# Patient Record
Sex: Female | Born: 1935 | Race: White | Hispanic: No | Marital: Married | Smoking: Never smoker
Health system: Southern US, Community
[De-identification: ages and names within clinical notes are randomized; demographics above are authoritative.]

## PROBLEM LIST (undated history)

## (undated) DIAGNOSIS — R112 Nausea with vomiting, unspecified: Secondary | ICD-10-CM

## (undated) DIAGNOSIS — N289 Disorder of kidney and ureter, unspecified: Secondary | ICD-10-CM

## (undated) DIAGNOSIS — I10 Essential (primary) hypertension: Secondary | ICD-10-CM

## (undated) DIAGNOSIS — N200 Calculus of kidney: Secondary | ICD-10-CM

## (undated) DIAGNOSIS — Z87442 Personal history of urinary calculi: Secondary | ICD-10-CM

## (undated) DIAGNOSIS — M1990 Unspecified osteoarthritis, unspecified site: Secondary | ICD-10-CM

## (undated) DIAGNOSIS — H269 Unspecified cataract: Secondary | ICD-10-CM

## (undated) DIAGNOSIS — T8859XA Other complications of anesthesia, initial encounter: Secondary | ICD-10-CM

## (undated) DIAGNOSIS — Z9889 Other specified postprocedural states: Secondary | ICD-10-CM

## (undated) HISTORY — PX: CHOLECYSTECTOMY: SHX55

## (undated) HISTORY — PX: SPINE SURGERY: SHX786

## (undated) HISTORY — DX: Unspecified cataract: H26.9

## (undated) HISTORY — DX: Calculus of kidney: N20.0

## (undated) HISTORY — PX: JOINT REPLACEMENT: SHX530

## (undated) HISTORY — PX: APPENDECTOMY: SHX54

---

## 1967-02-19 HISTORY — PX: BACK SURGERY: SHX140

## 2005-01-11 ENCOUNTER — Ambulatory Visit: Payer: Self-pay | Admitting: Family Medicine

## 2006-01-22 ENCOUNTER — Ambulatory Visit: Payer: Self-pay | Admitting: Family Medicine

## 2007-02-09 ENCOUNTER — Ambulatory Visit: Payer: Self-pay | Admitting: Family Medicine

## 2008-02-15 ENCOUNTER — Ambulatory Visit: Payer: Self-pay | Admitting: Family Medicine

## 2009-02-15 ENCOUNTER — Ambulatory Visit: Payer: Self-pay | Admitting: Family Medicine

## 2009-09-01 ENCOUNTER — Ambulatory Visit: Payer: Self-pay | Admitting: Family Medicine

## 2010-03-09 ENCOUNTER — Ambulatory Visit: Payer: Self-pay | Admitting: Family Medicine

## 2011-05-15 ENCOUNTER — Ambulatory Visit: Payer: Self-pay | Admitting: Family Medicine

## 2012-09-02 ENCOUNTER — Ambulatory Visit: Payer: Self-pay | Admitting: Family Medicine

## 2012-10-08 DIAGNOSIS — L570 Actinic keratosis: Secondary | ICD-10-CM

## 2012-10-08 HISTORY — DX: Actinic keratosis: L57.0

## 2014-01-19 ENCOUNTER — Ambulatory Visit: Payer: Self-pay | Admitting: Family Medicine

## 2014-04-15 ENCOUNTER — Ambulatory Visit: Payer: Self-pay | Admitting: Family Medicine

## 2014-09-07 ENCOUNTER — Other Ambulatory Visit: Payer: Self-pay | Admitting: Family Medicine

## 2014-09-07 DIAGNOSIS — I10 Essential (primary) hypertension: Secondary | ICD-10-CM | POA: Insufficient documentation

## 2014-09-07 NOTE — Telephone Encounter (Signed)
Last OV 03/2014  Thanks,   -Mickel Baas

## 2014-09-20 ENCOUNTER — Ambulatory Visit (INDEPENDENT_AMBULATORY_CARE_PROVIDER_SITE_OTHER): Payer: Medicare Other | Admitting: Family Medicine

## 2014-09-20 ENCOUNTER — Ambulatory Visit
Admission: RE | Admit: 2014-09-20 | Discharge: 2014-09-20 | Disposition: A | Discharge status: Home / Self Care | Payer: Medicare Other | Source: Ambulatory Visit | Attending: Family Medicine | Admitting: Family Medicine

## 2014-09-20 ENCOUNTER — Encounter: Payer: Self-pay | Admitting: Family Medicine

## 2014-09-20 ENCOUNTER — Telehealth: Payer: Self-pay

## 2014-09-20 VITALS — BP 162/72 | HR 108 | Temp 99.7°F | Resp 16 | Ht 63.0 in | Wt 136.0 lb

## 2014-09-20 DIAGNOSIS — J984 Other disorders of lung: Secondary | ICD-10-CM | POA: Diagnosis not present

## 2014-09-20 DIAGNOSIS — E538 Deficiency of other specified B group vitamins: Secondary | ICD-10-CM | POA: Insufficient documentation

## 2014-09-20 DIAGNOSIS — R6889 Other general symptoms and signs: Secondary | ICD-10-CM | POA: Diagnosis present

## 2014-09-20 DIAGNOSIS — R739 Hyperglycemia, unspecified: Secondary | ICD-10-CM | POA: Insufficient documentation

## 2014-09-20 DIAGNOSIS — J309 Allergic rhinitis, unspecified: Secondary | ICD-10-CM | POA: Insufficient documentation

## 2014-09-20 DIAGNOSIS — E559 Vitamin D deficiency, unspecified: Secondary | ICD-10-CM | POA: Insufficient documentation

## 2014-09-20 DIAGNOSIS — Z789 Other specified health status: Secondary | ICD-10-CM | POA: Insufficient documentation

## 2014-09-20 DIAGNOSIS — H8110 Benign paroxysmal vertigo, unspecified ear: Secondary | ICD-10-CM | POA: Insufficient documentation

## 2014-09-20 DIAGNOSIS — M5430 Sciatica, unspecified side: Secondary | ICD-10-CM | POA: Insufficient documentation

## 2014-09-20 DIAGNOSIS — I10 Essential (primary) hypertension: Secondary | ICD-10-CM | POA: Insufficient documentation

## 2014-09-20 LAB — POCT INFLUENZA A/B
INFLUENZA A, POC: NEGATIVE
Influenza B, POC: NEGATIVE

## 2014-09-20 MED ORDER — AMOXICILLIN-POT CLAVULANATE 875-125 MG PO TABS: 1.0000 | ORAL_TABLET | Freq: Two times a day (BID) | ORAL | Status: DC

## 2014-09-20 NOTE — Progress Notes (Signed)
Subjective:    Patient ID: Gina Middleton, female    DOB: 02-16-1936, 79 y.o.   MRN: [id number]  Sinus Problem This is a new problem. The current episode started in the past 7 days. The problem has been gradually worsening since onset. Maximum temperature: undocumented, pt felt febrile last night. Pt's temp is 99.7 today. Her pain is at a severity of 8/10. The pain is moderate (body aches). Associated symptoms include chills, congestion, coughing, diaphoresis, ear pain (right ear fullness), headaches, a hoarse voice and sinus pressure. Pertinent negatives include no neck pain, shortness of breath, sneezing, sore throat or swollen glands. Past treatments include acetaminophen and oral decongestants (NSAIDs, Claritin). The treatment provided no relief.      Review of Systems  Constitutional: Positive for chills and diaphoresis.  HENT: Positive for congestion, ear pain (right ear fullness), hoarse voice and sinus pressure. Negative for sneezing and sore throat.   Respiratory: Positive for cough. Negative for shortness of breath.   Musculoskeletal: Negative for neck pain.  Neurological: Positive for headaches.   BP 162/72 mmHg  Pulse 108  Temp(Src) 99.7 F (37.6 C) (Oral)  Resp 16  Ht 5\' 3"  (1.6 m)  Wt 136 lb (61.689 kg)  BMI 24.10 kg/m2  SpO2 96%   Patient Active Problem List   Diagnosis Date Noted  . Allergic rhinitis 09/20/2014  . Benign paroxysmal positional nystagmus 09/20/2014  . Blood glucose elevated 09/20/2014  . BP (high blood pressure) 09/20/2014  . Neuralgia neuritis, sciatic nerve 09/20/2014  . Vegetarian 09/20/2014  . B12 deficiency 09/20/2014  . Avitaminosis D 09/20/2014  . Hypertension 09/07/2014   No past medical history on file. Current Outpatient Prescriptions on File Prior to Visit  Medication Sig  . lisinopril-hydrochlorothiazide (PRINZIDE,ZESTORETIC) 10-12.5 MG per tablet take 1 tablet by mouth once daily   No current facility-administered  medications on file prior to visit.   Allergies  Allergen Reactions  . Iodinated Diagnostic Agents   . Oxycodone-Acetaminophen    Past Surgical History  Procedure Laterality Date  . Cholecystectomy    . Appendectomy     History   Social History  . Marital Status: Married    Spouse Name: N/A  . Number of Children: N/A  . Years of Education: College   Occupational History  . Retired Programmer, multimedia   . Works part-time at [employer]  . Smoking status: Never Smoker   . Smokeless tobacco: Never Used  . Alcohol Use: No  . Drug Use: No  . Sexual Activity: Not on file   Other Topics Concern  . Not on file   Social History Narrative   Family History  Problem Relation Age of Onset  . Hypertension Mother   . Alzheimer's disease Mother   . Heart attack Father     x's 3  . Diabetes Sister   . Heart attack Sister   . Breast cancer Maternal Grandmother        Objective:   Physical Exam  Constitutional: She is oriented to person, place, and time. She appears well-developed and well-nourished.  HENT:  Head: Normocephalic and atraumatic.  Right Ear: External ear normal.  Left Ear: External ear normal.  Mouth/Throat: Oropharynx is clear and moist.  Eyes: Conjunctivae and EOM are normal. Pupils are equal, round, and reactive to light.  Neck: Normal range of motion. Neck supple.  Cardiovascular: Normal rate and regular rhythm.   Pulmonary/Chest: Effort normal and breath sounds normal.  Neurological: She is alert and oriented to person, place, and time.  Psychiatric: She has a normal mood and affect. Her behavior is normal. Judgment and thought content normal.  Blood pressure 162/72, pulse 108, temperature 99.7 F (37.6 C), temperature source Oral, resp. rate 16, height 5\' 3"  (1.6 m), weight 136 lb (61.689 kg), SpO2 96 %.     Assessment & Plan:  1. Flu-like symptoms Will treat for pneumonia.   Check CXR.  Flu swab negative. Patient will call back  if condition worsens or does not continue to improve.    - POCT Influenza A/B - DG Chest 2 View; Future  Margarita Rana, MD

## 2014-09-20 NOTE — Telephone Encounter (Signed)
LMTCB 09/20/2014  Thanks,   -Mickel Baas

## 2014-09-20 NOTE — Telephone Encounter (Signed)
-----   Message from Margarita Rana, MD sent at 09/20/2014  3:57 PM EDT ----- CXR shows patchy infiltrated, unclear if pneumonia,  Recommend continue antibiotic and recheck CXR in 2 weeks. Thanks.

## 2014-09-21 NOTE — Telephone Encounter (Signed)
Patient advised as directed below. Will call in two weeks to for the CXR.  Thanks,  -Joseline

## 2014-09-21 NOTE — Telephone Encounter (Signed)
Pt is returning call.  CB#[phone]/MW

## 2014-10-12 ENCOUNTER — Ambulatory Visit
Admission: RE | Admit: 2014-10-12 | Discharge: 2014-10-12 | Disposition: A | Discharge status: Home / Self Care | Payer: Medicare Other | Source: Ambulatory Visit | Attending: Family Medicine | Admitting: Family Medicine

## 2014-10-12 ENCOUNTER — Other Ambulatory Visit: Payer: Self-pay | Admitting: Family Medicine

## 2014-10-12 DIAGNOSIS — R6889 Other general symptoms and signs: Secondary | ICD-10-CM

## 2014-10-12 DIAGNOSIS — R938 Abnormal findings on diagnostic imaging of other specified body structures: Secondary | ICD-10-CM | POA: Insufficient documentation

## 2014-10-12 DIAGNOSIS — R9389 Abnormal findings on diagnostic imaging of other specified body structures: Secondary | ICD-10-CM

## 2014-10-13 ENCOUNTER — Telehealth: Payer: Self-pay

## 2014-10-13 NOTE — Telephone Encounter (Signed)
LMTCB. sd  

## 2014-10-13 NOTE — Telephone Encounter (Signed)
Informed pt of results. Renaldo Fiddler, CMA

## 2014-10-13 NOTE — Telephone Encounter (Signed)
LMTCB 10/13/2014   Thanks,   -Ijanae Macapagal  

## 2014-10-13 NOTE — Telephone Encounter (Signed)
-----   Message from Margarita Rana, MD sent at 10/12/2014  1:21 PM EDT ----- CXR now normal. No need for further follow up. Thanks.

## 2014-11-04 ENCOUNTER — Ambulatory Visit (INDEPENDENT_AMBULATORY_CARE_PROVIDER_SITE_OTHER): Payer: Medicare Other | Admitting: Physician Assistant

## 2014-11-04 ENCOUNTER — Encounter: Payer: Self-pay | Admitting: Physician Assistant

## 2014-11-04 ENCOUNTER — Other Ambulatory Visit: Payer: Self-pay

## 2014-11-04 VITALS — BP 130/70 | HR 62 | Temp 97.8°F | Resp 16 | Wt 135.2 lb

## 2014-11-04 DIAGNOSIS — R300 Dysuria: Secondary | ICD-10-CM

## 2014-11-04 LAB — POCT URINALYSIS DIPSTICK
BILIRUBIN UA: NEGATIVE
GLUCOSE UA: NEGATIVE
KETONES UA: NEGATIVE
Leukocytes, UA: NEGATIVE
NITRITE UA: NEGATIVE
PH UA: 5
Protein, UA: NEGATIVE
RBC UA: NEGATIVE
Spec Grav, UA: 1.025
Urobilinogen, UA: 0.2

## 2014-11-04 MED ORDER — NITROFURANTOIN MONOHYD MACRO 100 MG PO CAPS: 100.0000 mg | ORAL_CAPSULE | Freq: Two times a day (BID) | ORAL | Status: DC

## 2014-11-04 NOTE — Progress Notes (Signed)
Patient: Gina Middleton Female    DOB: [birth year]   79 y.o.   MRN: [id number] Visit Date: 11/04/2014  Today's Provider: [name], PA-C   Chief Complaint  Patient presents with  . Urinary Tract Infection   Subjective:    Urinary Tract Infection  This is a new problem. The current episode started in the past 7 days. The problem occurs intermittently. The problem has been unchanged. The quality of the pain is described as burning (dull ache ). The pain is at a severity of 2/10. The pain is mild. There has been no fever. Associated symptoms include urgency. Pertinent negatives include no chills, discharge, flank pain, hematuria or nausea. She has tried increased fluids and sitz baths for the symptoms. The treatment provided mild (temporarily) relief.  She has also been using cranberry juice and Azo tabs. The symptoms will occur for a day or 2 and then they will subside for a day or 2 and then come right back. She states that today she isn't actually not having any symptoms. Yesterday however she was having a lot of burning discomfort with urination.     Allergies  Allergen Reactions  . Iodinated Diagnostic Agents   . Oxycodone-Acetaminophen    Previous Medications   ASPIRIN 81 MG TABLET    Take by mouth.   CYANOCOBALAMIN 1000 MCG TBCR    Take by mouth.   LISINOPRIL-HYDROCHLOROTHIAZIDE (PRINZIDE,ZESTORETIC) 10-12.5 MG PER TABLET    take 1 tablet by mouth once daily   MELOXICAM (MOBIC) 15 MG TABLET    Take by mouth.   OMEGA-3 FATTY ACIDS (FISH OIL) 1000 MG CPDR    Take by mouth.   ONDANSETRON (ZOFRAN-ODT) 4 MG DISINTEGRATING TABLET       VITAMIN D, ERGOCALCIFEROL, (DRISDOL) 50000 UNITS CAPS CAPSULE    Take by mouth.    Review of Systems  Constitutional: Negative.  Negative for chills.  HENT: Negative.   Eyes: Negative.   Respiratory: Positive for cough (from the pneumonia Dx 09/20/2014).   Cardiovascular: Negative.   Gastrointestinal: Negative.  Negative for  nausea.  Endocrine: Negative.   Genitourinary: Positive for urgency. Negative for hematuria, flank pain, vaginal pain and pelvic pain.  Musculoskeletal: Negative.   Skin: Negative.   Allergic/Immunologic: Negative.   Neurological: Negative.   Hematological: Negative.   Psychiatric/Behavioral: Negative.     Social History  Substance Use Topics  . Smoking status: Never Smoker   . Smokeless tobacco: Never Used  . Alcohol Use: No   Objective:   BP 130/70 mmHg  Pulse 62  Temp(Src) 97.8 F (36.6 C) (Oral)  Resp 16  Wt 135 lb 3.2 oz (61.326 kg)  Physical Exam  Constitutional: She is oriented to person, place, and time. She appears well-developed and well-nourished. No distress.  Cardiovascular: Normal rate, regular rhythm and normal heart sounds.  Exam reveals no gallop and no friction rub.   No murmur heard. Pulmonary/Chest: Effort normal and breath sounds normal. No respiratory distress. She has no wheezes. She has no rales.  Abdominal: Soft. Normal appearance and bowel sounds are normal. She exhibits no distension and no mass. There is no hepatosplenomegaly. There is tenderness in the suprapubic area. There is no rebound, no guarding and no CVA tenderness.  Suprapubic tenderness  Neurological: She is alert and oriented to person, place, and time.  Skin: Skin is warm and dry. She is not diaphoretic.        Assessment & Plan:  1. Difficult or painful urination UA was negative for white blood cells or nitrites. There was no hematuria noted on dipstick. Urine was slightly concentrated with a specific gravity of 1.025. I did encourage her to increase fluids as some of the burning sensation may be secondary to dehydration. I will still send her urine for a culture to rule out any possible bacterial infections. I also went ahead and gave her prescription for Macrobid as below to start once the culture results come back in. She is to call the office if symptoms persist or worsen. -  POCT urinalysis dipstick - Urine Culture - nitrofurantoin, macrocrystal-monohydrate, (MACROBID) 100 MG capsule; Take 1 capsule (100 mg total) by mouth 2 (two) times daily.  Dispense: 10 capsule; Refill: 0       [name], PA-C  [facility]

## 2014-11-04 NOTE — Patient Instructions (Signed)

## 2014-11-07 ENCOUNTER — Telehealth: Payer: Self-pay

## 2014-11-07 LAB — URINE CULTURE

## 2014-11-07 LAB — PLEASE NOTE

## 2014-11-07 MED ORDER — SULFAMETHOXAZOLE-TRIMETHOPRIM 800-160 MG PO TABS: 1.0000 | ORAL_TABLET | Freq: Two times a day (BID) | ORAL | Status: DC

## 2014-11-07 NOTE — Addendum Note (Signed)
Addended by: [name] on: 11/07/2014 09:43 AM   Modules accepted: Orders, Medications

## 2014-11-07 NOTE — Telephone Encounter (Signed)
LM with Kyung Rudd for patient to return the call.  Thanks,  -Joseline

## 2014-11-07 NOTE — Telephone Encounter (Signed)
-----   Message from [name], [place] sent at 11/07/2014  9:42 AM EDT ----- Culture shows antibiotic is insufficient in covering the UTI.  Will switch to trimethoprim-sulfa.  This new Rx will be sent to your pharmacy.  Please discontinue macrobid and start new ABx.  Thanks.

## 2014-11-07 NOTE — Telephone Encounter (Signed)
Patient advised as directed below.  Thanks,  -Joseline 

## 2015-01-25 ENCOUNTER — Ambulatory Visit (INDEPENDENT_AMBULATORY_CARE_PROVIDER_SITE_OTHER): Payer: Medicare Other | Admitting: Family Medicine

## 2015-01-25 ENCOUNTER — Encounter: Payer: Self-pay | Admitting: Family Medicine

## 2015-01-25 VITALS — BP 126/80 | HR 68 | Temp 98.7°F | Resp 16 | Wt 136.0 lb

## 2015-01-25 DIAGNOSIS — J309 Allergic rhinitis, unspecified: Secondary | ICD-10-CM | POA: Diagnosis not present

## 2015-01-25 DIAGNOSIS — E538 Deficiency of other specified B group vitamins: Secondary | ICD-10-CM

## 2015-01-25 DIAGNOSIS — R739 Hyperglycemia, unspecified: Secondary | ICD-10-CM | POA: Diagnosis not present

## 2015-01-25 DIAGNOSIS — I10 Essential (primary) hypertension: Secondary | ICD-10-CM

## 2015-01-25 DIAGNOSIS — E559 Vitamin D deficiency, unspecified: Secondary | ICD-10-CM | POA: Diagnosis not present

## 2015-01-25 DIAGNOSIS — Z0000 Encounter for general adult medical examination without abnormal findings: Secondary | ICD-10-CM

## 2015-01-25 NOTE — Progress Notes (Signed)
Patient ID: Gina Middleton, female   DOB: 10-13-1935, 79 y.o.   MRN: [id number]         Patient: Gina Middleton, Female    DOB: 12/08/1935, 79 y.o.   MRN: [id number] Visit Date: 01/26/2015  Today's Provider: Margarita Rana, MD   Chief Complaint  Patient presents with  . Annual Exam   Subjective:    Annual wellness visit Gina Middleton is a 79 y.o. female. She feels fairly well. She reports exercising regularly. She reports she is sleeping well. Chronic problems are stable. Has not acute concerns today.   -----------------------------------------------------------   Review of Systems  Constitutional: Negative.   HENT: Negative.   Eyes: Negative.   Respiratory: Negative.   Cardiovascular: Negative.   Endocrine: Negative.   Genitourinary: Positive for urgency. Negative for dysuria, frequency, hematuria, flank pain, decreased urine volume, vaginal bleeding, vaginal discharge, enuresis, difficulty urinating, genital sores, vaginal pain, menstrual problem, pelvic pain and dyspareunia.  Musculoskeletal: Positive for arthralgias.  Skin: Negative.   Allergic/Immunologic: Negative.   Neurological: Negative.   Hematological: Negative.   Psychiatric/Behavioral: Negative.     Social History   Social History  . Marital Status: Married    Spouse Name: N/A  . Number of Children: N/A  . Years of Education: College   Occupational History  . Retired Programmer, multimedia   . Works part-time at [employer]  . Smoking status: Never Smoker   . Smokeless tobacco: Never Used  . Alcohol Use: No  . Drug Use: No  . Sexual Activity: Not on file   Other Topics Concern  . Not on file   Social History Narrative    Patient Active Problem List   Diagnosis Date Noted  . Allergic rhinitis 09/20/2014  . Benign paroxysmal positional nystagmus 09/20/2014  . Blood glucose elevated 09/20/2014  . Neuralgia neuritis, sciatic nerve 09/20/2014  . Vegetarian  09/20/2014  . B12 deficiency 09/20/2014  . Avitaminosis D 09/20/2014  . Hypertension 09/07/2014    Past Surgical History  Procedure Laterality Date  . Cholecystectomy    . Appendectomy      Her family history includes Alzheimer's disease in her mother; Breast cancer in her maternal grandmother; Diabetes in her sister; Heart attack in her father and sister; Hypertension in her mother.    Previous Medications   ASPIRIN 81 MG TABLET    Take by mouth.   CYANOCOBALAMIN 1000 MCG TBCR    Take by mouth.   LISINOPRIL-HYDROCHLOROTHIAZIDE (PRINZIDE,ZESTORETIC) 10-12.5 MG PER TABLET    take 1 tablet by mouth once daily   MELOXICAM (MOBIC) 15 MG TABLET    Take by mouth.   OMEGA-3 FATTY ACIDS (FISH OIL) 1000 MG CPDR    Take by mouth.   VITAMIN D, ERGOCALCIFEROL, (DRISDOL) 50000 UNITS CAPS CAPSULE    Take by mouth.    Patient Care Team: Margarita Rana, MD as PCP - General (Family Medicine)     Objective:   Vitals: BP 126/80 mmHg  Pulse 68  Temp(Src) 98.7 F (37.1 C) (Oral)  Resp 16  Wt 136 lb (61.689 kg)  Physical Exam  Constitutional: She is oriented to person, place, and time. She appears well-developed and well-nourished.  HENT:  Head: Normocephalic and atraumatic.  Right Ear: Tympanic membrane, external ear and ear canal normal.  Left Ear: Tympanic membrane, external ear and ear canal normal.  Nose: Nose normal.  Mouth/Throat: Uvula is midline, oropharynx is clear and moist and mucous membranes  are normal.  Eyes: Conjunctivae, EOM and lids are normal. Pupils are equal, round, and reactive to light. Lids are everted and swept, no foreign bodies found.  Neck: Trachea normal. Carotid bruit is not present.  Cardiovascular: Normal rate, regular rhythm and normal heart sounds.   Pulmonary/Chest: Effort normal and breath sounds normal.  Musculoskeletal: Normal range of motion.  Neurological: She is alert and oriented to person, place, and time.  Skin: Skin is warm, dry and intact.    Psychiatric: She has a normal mood and affect. Her speech is normal and behavior is normal. Judgment and thought content normal. Cognition and memory are normal.    Activities of Daily Living In your present state of health, do you have any difficulty performing the following activities: 01/25/2015  Hearing? N  Vision? N  Difficulty concentrating or making decisions? N  Walking or climbing stairs? N  Dressing or bathing? N  Doing errands, shopping? N    Fall Risk Assessment Fall Risk  01/25/2015  Falls in the past year? No     Depression Screen PHQ 2/9 Scores 01/25/2015  PHQ - 2 Score 0    Cognitive Testing - 6-CIT  Correct? Score   What year is it? yes 0 0 or 4  What month is it? yes 0 0 or 3  Memorize:    [name],  44,  [address]  [place],      What time is it? (within 1 hour) yes 0 0 or 3  Count backwards from 20 yes 0 0, 2, or 4  Name the months of the year yes 0 0, 2, or 4  Repeat name & address above yes 3 0, 2, 4, 6, 8, or 10       TOTAL SCORE  3/28   Interpretation:  Normal  Normal (0-7) Abnormal (8-28)       Assessment & Plan:     Annual Wellness Visit  Reviewed patient's Family Medical History Reviewed and updated list of patient's medical providers Assessment of cognitive impairment was done Assessed patient's functional ability Established a written schedule for health screening [place] Completed and Reviewed  Exercise Activities and Dietary recommendations Goals    None      Immunization History  Administered Date(s) Administered  . Td 05/10/1995    Health Maintenance  Topic Date Due  . ZOSTAVAX  05/09/1997  . DEXA SCAN  05/10/2002  . PNA vac Low Risk Adult (1 of 2 - PCV13) 05/10/2002  . TETANUS/TDAP  05/09/2005  . INFLUENZA VACCINE  09/19/2014      Discussed health benefits of physical activity, and encouraged her to engage in regular exercise appropriate for her age and condition.   1. Medicare  annual wellness visit, subsequent As above. No concerns identified.   2. Essential hypertension Stable. Continue medication. Is concerned about dye in medication. Going to have pharmacy change it back.  - Lipid panel - TSH  3. Allergic rhinitis, unspecified allergic rhinitis type Stable.  4. B12 deficiency Will check labs.  - Vitamin B12  5. Avitaminosis D Will check labs.  - CBC with Differential/Platelet - VITAMIN D 25 Hydroxy (Vit-D Deficiency, Fractures)  6. Blood glucose elevated Will check labs.  - Comprehensive metabolic panel - Hemoglobin A1c   Patient was seen and examined by Jerrell Belfast, MD, and note scribed by Ashley Royalty, CMA.  I have reviewed the document for accuracy and completeness and I agree with above. Candiss Norse.  Venia Minks, MD  Margarita Rana, MD    ------------------------------------------------------------------------------------------------------------

## 2015-01-27 LAB — CBC WITH DIFFERENTIAL/PLATELET
BASOS: 0 %
Basophils Absolute: 0 10*3/uL (ref 0.0–0.2)
EOS (ABSOLUTE): 0.2 10*3/uL (ref 0.0–0.4)
EOS: 3 %
HEMATOCRIT: 36.7 % (ref 34.0–46.6)
Hemoglobin: 12.2 g/dL (ref 11.1–15.9)
Immature Grans (Abs): 0 10*3/uL (ref 0.0–0.1)
Immature Granulocytes: 0 %
LYMPHS ABS: 1.4 10*3/uL (ref 0.7–3.1)
Lymphs: 29 %
MCH: 30 pg (ref 26.6–33.0)
MCHC: 33.2 g/dL (ref 31.5–35.7)
MCV: 90 fL (ref 79–97)
MONOCYTES: 10 %
MONOS ABS: 0.5 10*3/uL (ref 0.1–0.9)
NEUTROS PCT: 58 %
Neutrophils Absolute: 2.8 10*3/uL (ref 1.4–7.0)
Platelets: 219 10*3/uL (ref 150–379)
RBC: 4.07 x10E6/uL (ref 3.77–5.28)
RDW: 13.1 % (ref 12.3–15.4)
WBC: 4.8 10*3/uL (ref 3.4–10.8)

## 2015-01-27 LAB — HEMOGLOBIN A1C
Est. average glucose Bld gHb Est-mCnc: 120 mg/dL
HEMOGLOBIN A1C: 5.8 % — AB (ref 4.8–5.6)

## 2015-01-27 LAB — COMPREHENSIVE METABOLIC PANEL
A/G RATIO: 1.6 (ref 1.1–2.5)
ALT: 12 IU/L (ref 0–32)
AST: 19 IU/L (ref 0–40)
Albumin: 4.2 g/dL (ref 3.5–4.8)
Alkaline Phosphatase: 98 IU/L (ref 39–117)
BUN / CREAT RATIO: 20 (ref 11–26)
BUN: 18 mg/dL (ref 8–27)
Bilirubin Total: 0.7 mg/dL (ref 0.0–1.2)
CALCIUM: 9.4 mg/dL (ref 8.7–10.3)
CO2: 27 mmol/L (ref 18–29)
Chloride: 103 mmol/L (ref 97–106)
Creatinine, Ser: 0.9 mg/dL (ref 0.57–1.00)
GFR calc Af Amer: 71 mL/min/{1.73_m2} (ref >59)
GFR, EST NON AFRICAN AMERICAN: 62 mL/min/{1.73_m2} (ref >59)
GLOBULIN, TOTAL: 2.6 g/dL (ref 1.5–4.5)
Glucose: 103 mg/dL — ABNORMAL HIGH (ref 65–99)
POTASSIUM: 4.3 mmol/L (ref 3.5–5.2)
SODIUM: 144 mmol/L (ref 136–144)
Total Protein: 6.8 g/dL (ref 6.0–8.5)

## 2015-01-27 LAB — TSH: TSH: 1.81 u[IU]/mL (ref 0.450–4.500)

## 2015-01-27 LAB — LIPID PANEL
CHOL/HDL RATIO: 4.2 ratio (ref 0.0–4.4)
Cholesterol, Total: 146 mg/dL (ref 100–199)
HDL: 35 mg/dL — ABNORMAL LOW (ref >39)
LDL CALC: 79 mg/dL (ref 0–99)
Triglycerides: 159 mg/dL — ABNORMAL HIGH (ref 0–149)
VLDL Cholesterol Cal: 32 mg/dL (ref 5–40)

## 2015-01-27 LAB — VITAMIN B12: VITAMIN B 12: 462 pg/mL (ref 211–946)

## 2015-01-27 LAB — VITAMIN D 25 HYDROXY (VIT D DEFICIENCY, FRACTURES): VIT D 25 HYDROXY: 26.8 ng/mL — AB (ref 30.0–100.0)

## 2015-01-28 NOTE — Addendum Note (Signed)
Addended by: Jerrell Belfast on: 01/28/2015 12:29 PM   Modules accepted: Miquel Dunn

## 2015-01-30 ENCOUNTER — Telehealth: Payer: Self-pay

## 2015-01-30 ENCOUNTER — Telehealth: Payer: Self-pay | Admitting: Family Medicine

## 2015-01-30 DIAGNOSIS — Z1239 Encounter for other screening for malignant neoplasm of breast: Secondary | ICD-10-CM

## 2015-01-30 DIAGNOSIS — Z780 Asymptomatic menopausal state: Secondary | ICD-10-CM

## 2015-01-30 NOTE — Telephone Encounter (Signed)
Ok to refer. Thanks

## 2015-01-30 NOTE — Telephone Encounter (Signed)
Is it okay to refer?  Thanks,   -Mickel Baas

## 2015-01-30 NOTE — Telephone Encounter (Signed)
Left message to call back  

## 2015-01-30 NOTE — Telephone Encounter (Signed)
Advised patient of results. She reports that she has not been taking Vit D supplement, but she will start back.

## 2015-01-30 NOTE — Telephone Encounter (Signed)
Pt is requesting referral to [facility] for bone density and mammogram.She would like an afternoon appointment if possible before the end of year

## 2015-01-30 NOTE — Telephone Encounter (Signed)
-----   Message from Margarita Rana, MD sent at 01/28/2015 12:32 PM EST ----- WRONG PATIENT NOTE.   Ignore previous message. Please notify patient labs stable. Except for  Mildly low Vit. D. Please see if patient taking Vit D. Thanks.

## 2015-01-31 NOTE — Telephone Encounter (Signed)
Bone density and mammogram scheduled at [facility] for 02/08/15 at 2:00

## 2015-01-31 NOTE — Telephone Encounter (Signed)
Ordered bone density and mammogram as below.

## 2015-02-08 ENCOUNTER — Ambulatory Visit
Admission: RE | Admit: 2015-02-08 | Discharge: 2015-02-08 | Disposition: A | Discharge status: Home / Self Care | Payer: Medicare Other | Source: Ambulatory Visit | Attending: Family Medicine | Admitting: Family Medicine

## 2015-02-08 ENCOUNTER — Other Ambulatory Visit: Payer: Self-pay | Admitting: Family Medicine

## 2015-02-08 DIAGNOSIS — Z1239 Encounter for other screening for malignant neoplasm of breast: Secondary | ICD-10-CM

## 2015-02-08 DIAGNOSIS — Z780 Asymptomatic menopausal state: Secondary | ICD-10-CM | POA: Diagnosis not present

## 2015-02-08 DIAGNOSIS — Z1231 Encounter for screening mammogram for malignant neoplasm of breast: Secondary | ICD-10-CM | POA: Diagnosis not present

## 2015-02-08 DIAGNOSIS — Z13820 Encounter for screening for osteoporosis: Secondary | ICD-10-CM | POA: Diagnosis present

## 2015-02-08 DIAGNOSIS — M8580 Other specified disorders of bone density and structure, unspecified site: Secondary | ICD-10-CM | POA: Insufficient documentation

## 2015-02-09 ENCOUNTER — Telehealth: Payer: Self-pay

## 2015-02-09 NOTE — Telephone Encounter (Signed)
LMTCB  02/09/2015  Thanks,   -Mickel Baas

## 2015-02-09 NOTE — Telephone Encounter (Signed)
-----   Message from Margarita Rana, MD sent at 02/08/2015  4:26 PM EST ----- Mammogram normal.  Bone density with 10 year probability of hip fracture at 2.8%. Treatment recommended if over 3 %. If has strong concern, would be ok to come in and talk about starting medication. Or work on continued healthy lifestyle and weight bearing exercise.  Thanks.

## 2015-02-10 NOTE — Telephone Encounter (Signed)
Advised patient as below. Patient reports that she will continue to work on healthy lifestyle and weight bearing exercise. Patient reports that she will call if she starts to develop any symptoms.

## 2015-05-12 ENCOUNTER — Encounter: Payer: Self-pay | Admitting: Family Medicine

## 2015-05-12 ENCOUNTER — Ambulatory Visit (INDEPENDENT_AMBULATORY_CARE_PROVIDER_SITE_OTHER): Payer: Medicare Other | Admitting: Family Medicine

## 2015-05-12 VITALS — BP 150/88 | HR 80 | Temp 98.2°F | Resp 16 | Wt 134.0 lb

## 2015-05-12 DIAGNOSIS — M7660 Achilles tendinitis, unspecified leg: Secondary | ICD-10-CM | POA: Insufficient documentation

## 2015-05-12 DIAGNOSIS — M7662 Achilles tendinitis, left leg: Secondary | ICD-10-CM

## 2015-05-12 DIAGNOSIS — M79672 Pain in left foot: Secondary | ICD-10-CM

## 2015-05-12 DIAGNOSIS — I10 Essential (primary) hypertension: Secondary | ICD-10-CM

## 2015-05-12 MED ORDER — LISINOPRIL 10 MG PO TABS
10.0000 mg | ORAL_TABLET | Freq: Every day | ORAL | Status: DC
Start: 2015-05-12 — End: 2016-06-20

## 2015-05-12 NOTE — Progress Notes (Signed)
Subjective:    Patient ID: Gina Middleton, female    DOB: November 22, 1935, 79 y.o.   MRN: [id number]  Ankle Pain  Incident onset: pain x 6 weeks. There was no injury mechanism. The pain is present in the left ankle. The quality of the pain is described as aching (sharp). The pain is at a severity of 5/10. The pain is moderate. The pain has been fluctuating since onset. Associated symptoms include an inability to bear weight and tingling. Pertinent negatives include no loss of motion, loss of sensation, muscle weakness or numbness. Exacerbated by: walking. She has tried nothing for the symptoms.  Hypertension This is a chronic problem. Associated symptoms include blurred vision (cataracts) and peripheral edema (left ankle). Pertinent negatives include no anxiety, chest pain, headaches, neck pain, orthopnea, palpitations, shortness of breath or sweats. Treatments tried: Lisinopril-HCTZ. Compliance problems include medication side effects (pharmacy recently changed manufacturers and the new tablet is causing pt "bladder spasms").       Review of Systems  Eyes: Positive for blurred vision (cataracts).  Respiratory: Negative for shortness of breath.   Cardiovascular: Negative for chest pain, palpitations and orthopnea.  Musculoskeletal: Negative for neck pain.  Neurological: Positive for tingling. Negative for numbness and headaches.   BP 150/88 mmHg  Pulse 80  Temp(Src) 98.2 F (36.8 C) (Oral)  Resp 16  Wt 134 lb (60.782 kg)   Patient Active Problem List   Diagnosis Date Noted  . Allergic rhinitis 09/20/2014  . Benign paroxysmal positional nystagmus 09/20/2014  . Blood glucose elevated 09/20/2014  . Neuralgia neuritis, sciatic nerve 09/20/2014  . Vegetarian 09/20/2014  . B12 deficiency 09/20/2014  . Avitaminosis D 09/20/2014  . Hypertension 09/07/2014   No past medical history on file. Current Outpatient Prescriptions on File Prior to Visit  Medication Sig  . aspirin 81 MG  tablet Take by mouth.  . Cyanocobalamin 1000 MCG TBCR Take by mouth.  . meloxicam (MOBIC) 15 MG tablet Take by mouth.  . Omega-3 Fatty Acids (FISH OIL) 1000 MG CPDR Take by mouth.  . Vitamin D, Ergocalciferol, (DRISDOL) 50000 UNITS CAPS capsule Take by mouth.  Marland Kitchen lisinopril-hydrochlorothiazide (PRINZIDE,ZESTORETIC) 10-12.5 MG per tablet take 1 tablet by mouth once daily (Patient not taking: Reported on 05/12/2015)   No current facility-administered medications on file prior to visit.   Allergies  Allergen Reactions  . Iodinated Diagnostic Agents   . Oxycodone-Acetaminophen    Past Surgical History  Procedure Laterality Date  . Cholecystectomy    . Appendectomy     Social History   Social History  . Marital Status: Married    Spouse Name: N/A  . Number of Children: N/A  . Years of Education: College   Occupational History  . Retired Programmer, multimedia   . Works part-time at [employer]  . Smoking status: Never Smoker   . Smokeless tobacco: Never Used  . Alcohol Use: No  . Drug Use: No  . Sexual Activity: Not on file   Other Topics Concern  . Not on file   Social History Narrative   Family History  Problem Relation Age of Onset  . Hypertension Mother   . Alzheimer's disease Mother   . Heart attack Father     x's 3  . Diabetes Sister   . Heart attack Sister   . Breast cancer Maternal Grandmother       Objective:   Physical Exam  Constitutional: She is oriented to person, place,  and time. She appears well-developed and well-nourished.  Musculoskeletal: Normal range of motion. She exhibits edema and tenderness.  Left achilles with tenderness and pain.  Rest of exam normal.    Neurological: She is alert and oriented to person, place, and time.  Psychiatric: She has a normal mood and affect. Her behavior is normal. Judgment and thought content normal.   BP 150/88 mmHg  Pulse 80  Temp(Src) 98.2 F (36.8 C) (Oral)  Resp 16  Wt 134 lb (60.782  kg)      Assessment & Plan:  1. Essential hypertension Not controlled.  Will change to Lisinopril and see if she tolerates the medication and it controls her  BP.   - lisinopril (PRINIVIL,ZESTRIL) 10 MG tablet; Take 1 tablet (10 mg total) by mouth daily.  Dispense: 90 tablet; Refill: 3 - CBC with Differential/Platelet - Comprehensive metabolic panel  2. Left foot pain New problem. Will check labs to rule out gout or other etiology.   - CBC with Differential/Platelet - Comprehensive metabolic panel - Sedimentation rate - Uric acid  3. Achilles tendinitis of left lower extremity New problem Appears to be a tendonitis. Will treat with Meloxicam and ice.  Refer to podiatry if does not improve.   - Ambulatory referral to Podiatry   Patient was seen and examined by Jerrell Belfast, MD, and note scribed by Renaldo Fiddler, CMA. I have reviewed the document for accuracy and completeness and I agree with above. Jerrell Belfast, MD   Margarita Rana, MD

## 2015-05-13 LAB — CBC WITH DIFFERENTIAL/PLATELET
Basophils Absolute: 0 10*3/uL (ref 0.0–0.2)
Basos: 0 %
EOS (ABSOLUTE): 0.1 10*3/uL (ref 0.0–0.4)
Eos: 2 %
Hematocrit: 36.3 % (ref 34.0–46.6)
Hemoglobin: 12.6 g/dL (ref 11.1–15.9)
IMMATURE GRANULOCYTES: 0 %
Immature Grans (Abs): 0 10*3/uL (ref 0.0–0.1)
LYMPHS ABS: 1.8 10*3/uL (ref 0.7–3.1)
Lymphs: 25 %
MCH: 30.9 pg (ref 26.6–33.0)
MCHC: 34.7 g/dL (ref 31.5–35.7)
MCV: 89 fL (ref 79–97)
MONOS ABS: 0.6 10*3/uL (ref 0.1–0.9)
Monocytes: 8 %
Neutrophils Absolute: 4.6 10*3/uL (ref 1.4–7.0)
Neutrophils: 65 %
PLATELETS: 210 10*3/uL (ref 150–379)
RBC: 4.08 x10E6/uL (ref 3.77–5.28)
RDW: 13 % (ref 12.3–15.4)
WBC: 7.1 10*3/uL (ref 3.4–10.8)

## 2015-05-13 LAB — COMPREHENSIVE METABOLIC PANEL
A/G RATIO: 1.8 (ref 1.2–2.2)
ALK PHOS: 104 IU/L (ref 39–117)
ALT: 14 IU/L (ref 0–32)
AST: 21 IU/L (ref 0–40)
Albumin: 4.4 g/dL (ref 3.5–4.8)
BUN/Creatinine Ratio: 20 (ref 11–26)
BUN: 17 mg/dL (ref 8–27)
Bilirubin Total: 0.6 mg/dL (ref 0.0–1.2)
CALCIUM: 9.3 mg/dL (ref 8.7–10.3)
CO2: 26 mmol/L (ref 18–29)
Chloride: 103 mmol/L (ref 96–106)
Creatinine, Ser: 0.83 mg/dL (ref 0.57–1.00)
GFR calc Af Amer: 78 mL/min/{1.73_m2} (ref >59)
GFR, EST NON AFRICAN AMERICAN: 68 mL/min/{1.73_m2} (ref >59)
Globulin, Total: 2.5 g/dL (ref 1.5–4.5)
Glucose: 102 mg/dL — ABNORMAL HIGH (ref 65–99)
POTASSIUM: 4.8 mmol/L (ref 3.5–5.2)
SODIUM: 145 mmol/L — AB (ref 134–144)
Total Protein: 6.9 g/dL (ref 6.0–8.5)

## 2015-05-13 LAB — URIC ACID: URIC ACID: 5.3 mg/dL (ref 2.5–7.1)

## 2015-05-13 LAB — SEDIMENTATION RATE: SED RATE: 6 mm/h (ref 0–40)

## 2015-05-15 ENCOUNTER — Telehealth: Payer: Self-pay

## 2015-05-15 NOTE — Telephone Encounter (Signed)
-----   Message from Margarita Rana, MD sent at 05/13/2015  8:31 AM EDT ----- Labs stable and NOT  consistent with gout. Please notify patient and proceed with plan as discussed. Thanks.

## 2015-05-15 NOTE — Telephone Encounter (Signed)
LMTCB 05/15/2015  Thanks,   -Mickel Baas

## 2015-05-15 NOTE — Telephone Encounter (Signed)
LMTCB. sd  

## 2015-05-15 NOTE — Telephone Encounter (Signed)
Tried calling; Number did not go through.     Thanks,   -Mickel Baas

## 2015-05-15 NOTE — Telephone Encounter (Signed)
Pt is returning call.  CB#[phone] [id number] 25/MW

## 2015-05-15 NOTE — Telephone Encounter (Signed)
Advised patient of results.  

## 2016-02-08 ENCOUNTER — Ambulatory Visit (INDEPENDENT_AMBULATORY_CARE_PROVIDER_SITE_OTHER): Payer: Medicare Other | Admitting: Physician Assistant

## 2016-02-08 ENCOUNTER — Encounter: Payer: Self-pay | Admitting: Physician Assistant

## 2016-02-08 VITALS — BP 140/88 | HR 72 | Temp 98.3°F | Resp 16 | Ht 62.0 in | Wt 130.0 lb

## 2016-02-08 DIAGNOSIS — E559 Vitamin D deficiency, unspecified: Secondary | ICD-10-CM | POA: Diagnosis not present

## 2016-02-08 DIAGNOSIS — I10 Essential (primary) hypertension: Secondary | ICD-10-CM | POA: Diagnosis not present

## 2016-02-08 DIAGNOSIS — Z789 Other specified health status: Secondary | ICD-10-CM | POA: Diagnosis not present

## 2016-02-08 DIAGNOSIS — Z1211 Encounter for screening for malignant neoplasm of colon: Secondary | ICD-10-CM | POA: Diagnosis not present

## 2016-02-08 DIAGNOSIS — Z1231 Encounter for screening mammogram for malignant neoplasm of breast: Secondary | ICD-10-CM | POA: Diagnosis not present

## 2016-02-08 DIAGNOSIS — E538 Deficiency of other specified B group vitamins: Secondary | ICD-10-CM | POA: Diagnosis not present

## 2016-02-08 DIAGNOSIS — Z0001 Encounter for general adult medical examination with abnormal findings: Secondary | ICD-10-CM

## 2016-02-08 DIAGNOSIS — R739 Hyperglycemia, unspecified: Secondary | ICD-10-CM | POA: Diagnosis not present

## 2016-02-08 DIAGNOSIS — Z1239 Encounter for other screening for malignant neoplasm of breast: Secondary | ICD-10-CM

## 2016-02-08 DIAGNOSIS — Z0000 Encounter for general adult medical examination without abnormal findings: Secondary | ICD-10-CM

## 2016-02-08 MED ORDER — HYDROCHLOROTHIAZIDE 25 MG PO TABS: 12.5000 mg | ORAL_TABLET | Freq: Every day | ORAL | 3 refills | Status: DC

## 2016-02-08 NOTE — Patient Instructions (Signed)

## 2016-02-08 NOTE — Progress Notes (Signed)
Patient: Gina Middleton, Female    DOB: [birth year], 80 y.o.   MRN: [id number] Visit Date: 02/08/2016  Today's Provider: [name], PA-C   Chief Complaint  Patient presents with  . Annual Exam  . Hypertension   Subjective:    Annual wellness visit Gina Middleton is a 80 y.o. female. She feels well. She reports she is not exercising regularly, but she does stay active. She reports she is sleeping well.  Last AWV- 01/25/2015 Last mammo- 02/08/2015- BI-RADS 1 Last BMD- 02/08/2015- osteopenia Last pap- 12/20/2013- Negative Last colonoscopy- More than 10 years ago per pt. Pt refuses flu vaccine and pneumonia vaccine.  -----------------------------------------------------------  Hypertension, follow-up:  BP Readings from Last 3 Encounters:  02/08/16 140/88  05/12/15 (!) 150/88  01/25/15 126/80    She was last seen for hypertension 9 months ago.  BP at that visit was 150/88. Management since that visit includes changing medication from Lisinopril-HCTZ 10-12.5 mg to Lisinopril 10 mg. She reports excellent compliance with treatment. She is not having side effects.  She is not exercising. She is adherent to low salt diet.   Outside blood pressures are 160/98 at dentist's office on Tuesday. She is experiencing none.  Patient denies chest pain, chest pressure/discomfort, claudication, dyspnea, exertional chest pressure/discomfort, fatigue, irregular heart beat, lower extremity edema, near-syncope, orthopnea, palpitations and syncope.   Cardiovascular risk factors include advanced age (older than 80 for men, 77 for women) and hypertension.   HCTZ had been stopped in the past due to bladder spasms after a manufacturer change.  Weight trend: stable Wt Readings from Last 3 Encounters:  02/08/16 130 lb (59 kg)  05/12/15 134 lb (60.8 kg)  01/25/15 136 lb (61.7 kg)    Current diet:  vegetarian ------------------------------------------------------------------------    Review of Systems  Constitutional: Negative.   HENT: Negative.   Eyes: Negative.   Respiratory: Negative.   Cardiovascular: Negative.   Gastrointestinal: Negative.   Endocrine: Negative.   Genitourinary: Negative.   Musculoskeletal: Positive for arthralgias. Negative for back pain, gait problem, joint swelling, myalgias, neck pain and neck stiffness.  Skin: Negative.   Allergic/Immunologic: Negative.   Neurological: Positive for numbness (right hand;chronic; only intermittently; with using computer mouse). Negative for dizziness, tremors, seizures, syncope, facial asymmetry, speech difficulty, weakness, light-headedness and headaches.  Hematological: Negative.   Psychiatric/Behavioral: Negative.     Social History   Social History  . Marital status: Married    Spouse name: Kyung Rudd  . Number of children: 0  . Years of education: College   Occupational History  . Retired Programmer, multimedia   . Works part-time at [employer]   . [place]    Part Time   Social History Main Topics  . Smoking status: Never Smoker  . Smokeless tobacco: Never Used  . Alcohol use No  . Drug use: No  . Sexual activity: No   Other Topics Concern  . Not on file   Social History Narrative  . No narrative on file    History reviewed. No pertinent past medical history.   Patient Active Problem List   Diagnosis Date Noted  . Achilles tendonitis 05/12/2015  . Allergic rhinitis 09/20/2014  . Benign paroxysmal positional nystagmus 09/20/2014  . Blood glucose elevated 09/20/2014  . Neuralgia neuritis, sciatic nerve 09/20/2014  . Vegetarian 09/20/2014  . B12 deficiency 09/20/2014  . Avitaminosis D 09/20/2014  . Hypertension 09/07/2014    Past Surgical History:  Procedure Laterality Date  . APPENDECTOMY    . [employer] [facility] [employer]  . CHOLECYSTECTOMY      Her family history  includes Alzheimer's disease in her mother; Breast cancer in her maternal grandmother; Diabetes in her sister; Heart attack in her father and sister; Hypertension in her mother.      Current Outpatient Prescriptions:  .  aspirin 81 MG tablet, Take by mouth., Disp: , Rfl:  .  CRANBERRY PO, Take by mouth., Disp: , Rfl:  .  Cyanocobalamin 1000 MCG TBCR, Take by mouth., Disp: , Rfl:  .  lisinopril (PRINIVIL,ZESTRIL) 10 MG tablet, Take 1 tablet (10 mg total) by mouth daily., Disp: 90 tablet, Rfl: 3 .  meloxicam (MOBIC) 15 MG tablet, Take 7.5 mg by mouth as needed. , Disp: , Rfl:  .  Omega-3 Fatty Acids (FISH OIL) 1000 MG CPDR, Take by mouth., Disp: , Rfl:  .  Vitamin D, Ergocalciferol, (DRISDOL) 50000 UNITS CAPS capsule, Take by mouth., Disp: , Rfl:   Patient Care Team: Margarita Rana, MD as PCP - General (Family Medicine)     Objective:   Vitals: BP 140/88 (BP Location: Left Arm, Patient Position: Sitting, Cuff Size: Normal)   Pulse 72   Temp 98.3 F (36.8 C) (Oral)   Resp 16   Ht 5\' 2"  (1.575 m)   Wt 130 lb (59 kg)   BMI 23.78 kg/m   Physical Exam  Constitutional: She is oriented to person, place, and time. She appears well-developed and well-nourished. No distress.  HENT:  Head: Normocephalic and atraumatic.  Right Ear: Tympanic membrane, external ear and ear canal normal.  Left Ear: Tympanic membrane, external ear and ear canal normal.  Nose: Nose normal.  Mouth/Throat: Uvula is midline, oropharynx is clear and moist and mucous membranes are normal. No oropharyngeal exudate.  Eyes: Conjunctivae and EOM are normal. Pupils are equal, round, and reactive to light. Right eye exhibits no discharge. Left eye exhibits no discharge. No scleral icterus.  Neck: Normal range of motion. Neck supple. No JVD present. Carotid bruit is not present. No tracheal deviation present. No thyromegaly present.  Cardiovascular: Normal rate, regular rhythm, normal heart sounds and intact distal pulses.   Exam reveals no gallop and no friction rub.   No murmur heard. Pulmonary/Chest: Effort normal and breath sounds normal. No respiratory distress. She has no decreased breath sounds. She has no wheezes. She has no rhonchi. She has no rales. She exhibits no tenderness. Right breast exhibits no inverted nipple, no mass, no nipple discharge, no skin change and no tenderness. Left breast exhibits no inverted nipple, no mass, no nipple discharge, no skin change and no tenderness. Breasts are symmetrical.  Abdominal: Soft. Bowel sounds are normal. She exhibits no distension and no mass. There is no tenderness. There is no rebound and no guarding.  Musculoskeletal: Normal range of motion. She exhibits no edema or tenderness.       Right wrist: Normal.  Negative Phalen and Tinel sign  Lymphadenopathy:    She has no cervical adenopathy.  Neurological: She is alert and oriented to person, place, and time.  Skin: Skin is warm and dry. No rash noted. She is not diaphoretic.  Psychiatric: She has a normal mood and affect. Her behavior is normal. Judgment and thought content normal.  Vitals reviewed.   Activities of Daily Living In your present state of health, do you have any difficulty performing the following activities: 02/08/2016  Hearing? Y  Vision? N  Difficulty  concentrating or making decisions? N  Walking or climbing stairs? N  Dressing or bathing? N  Doing errands, shopping? N  Some recent data might be hidden    Fall Risk Assessment Fall Risk  02/08/2016 01/25/2015  Falls in the past year? No No     Depression Screen PHQ 2/9 Scores 02/08/2016 01/25/2015  PHQ - 2 Score 0 0    Cognitive Testing - 6-CIT  Correct? Score   What year is it? yes 0 0 or 4  What month is it? yes 0 0 or 3  Memorize:    [name],  44,  [address],  [place],      What time is it? (within 1 hour) yes 0 0 or 3  Count backwards from 20 yes 0 0, 2, or 4  Name the months of the year yes 0 0, 2, or 4  Repeat  name & address above yes 0 0, 2, 4, 6, 8, or 10       TOTAL SCORE  0/28   Interpretation:  Normal  Normal (0-7) Abnormal (8-28)       Assessment & Plan:     Annual Wellness Visit  Reviewed patient's Family Medical History Reviewed and updated list of patient's medical providers Assessment of cognitive impairment was done Assessed patient's functional ability Established a written schedule for health screening [place] Completed and Reviewed  Exercise Activities and Dietary recommendations Goals    None      Immunization History  Administered Date(s) Administered  . Td 05/10/1995    Health Maintenance  Topic Date Due  . ZOSTAVAX  05/09/1997  . PNA vac Low Risk Adult (1 of 2 - PCV13) 05/10/2002  . TETANUS/TDAP  05/09/2005  . INFLUENZA VACCINE  09/19/2015  . DEXA SCAN  Completed     Discussed health benefits of physical activity, and encouraged her to engage in regular exercise appropriate for her age and condition.    1. Medicare annual wellness visit, subsequent Normal physical exam.  2. Breast cancer screening Breast exam today was normal. There is family history of breast cancer in maternal grandmother. She does perform regular self breast exams. Mammogram was ordered as below. Information for [facility] Breast clinic was given to patient so she may schedule her mammogram at her convenience. - Mammogram Digital Screening; Future  3. Colon cancer screening Patient refuses colonoscopy. No family history of colon cancer. She is willing to do cologuard and get diagnostic colonoscopy if positive. Previous colonoscopy was normal.  - Cologuard  4. Essential hypertension Not to goal. Will add HCTZ 12.5mg  back but separate to see if it will cause bladder spasm again. Will check labs as below and f/u pending results. - CBC with Differential - Comprehensive metabolic panel - Lipid panel - TSH - hydrochlorothiazide (HYDRODIURIL) 25 MG tablet; Take  0.5 tablets (12.5 mg total) by mouth daily.  Dispense: 45 tablet; Refill: 3  5. Blood glucose elevated Will check labs as below and f/u pending results. - Comprehensive metabolic panel - Hemoglobin A1c  6. Vegetarian Patient is vegetarian with Vit deficiencies and on supplementation. Having numbness and muscle aches.  Will check labs as below and f/u pending results. - CBC with Differential - Comprehensive metabolic panel - TSH - Magnesium  7. B12 deficiency H/O def with numbness on supplementation. Will check labs as below and f/u pending results. - B12  8. Avitaminosis D H/O def with osteopenia on supplementation. Will check labs as below and  f/u pending results. - Vitamin D (25 hydroxy)  ------------------------------------------------------------------------------------------------------------  Patient seen and examined by [name], PA-C, and note scribed by Renaldo Fiddler, CMA.   [name], PA-C  [facility]

## 2016-02-10 LAB — LIPID PANEL
CHOLESTEROL TOTAL: 150 mg/dL (ref 100–199)
Chol/HDL Ratio: 3.8 ratio units (ref 0.0–4.4)
HDL: 39 mg/dL — ABNORMAL LOW (ref >39)
LDL Calculated: 83 mg/dL (ref 0–99)
TRIGLYCERIDES: 139 mg/dL (ref 0–149)
VLDL Cholesterol Cal: 28 mg/dL (ref 5–40)

## 2016-02-10 LAB — HEMOGLOBIN A1C
Est. average glucose Bld gHb Est-mCnc: 108 mg/dL
HEMOGLOBIN A1C: 5.4 % (ref 4.8–5.6)

## 2016-02-10 LAB — CBC WITH DIFFERENTIAL/PLATELET
BASOS: 1 %
Basophils Absolute: 0 10*3/uL (ref 0.0–0.2)
EOS (ABSOLUTE): 0.1 10*3/uL (ref 0.0–0.4)
Eos: 2 %
Hematocrit: 36.8 % (ref 34.0–46.6)
Hemoglobin: 12.3 g/dL (ref 11.1–15.9)
IMMATURE GRANS (ABS): 0 10*3/uL (ref 0.0–0.1)
Immature Granulocytes: 0 %
LYMPHS ABS: 1.3 10*3/uL (ref 0.7–3.1)
LYMPHS: 21 %
MCH: 30.1 pg (ref 26.6–33.0)
MCHC: 33.4 g/dL (ref 31.5–35.7)
MCV: 90 fL (ref 79–97)
MONOS ABS: 0.4 10*3/uL (ref 0.1–0.9)
Monocytes: 7 %
NEUTROS ABS: 4.1 10*3/uL (ref 1.4–7.0)
Neutrophils: 69 %
PLATELETS: 192 10*3/uL (ref 150–379)
RBC: 4.08 x10E6/uL (ref 3.77–5.28)
RDW: 12.8 % (ref 12.3–15.4)
WBC: 5.9 10*3/uL (ref 3.4–10.8)

## 2016-02-10 LAB — TSH: TSH: 1.29 u[IU]/mL (ref 0.450–4.500)

## 2016-02-10 LAB — COMPREHENSIVE METABOLIC PANEL
ALK PHOS: 93 IU/L (ref 39–117)
ALT: 17 IU/L (ref 0–32)
AST: 21 IU/L (ref 0–40)
Albumin/Globulin Ratio: 1.5 (ref 1.2–2.2)
Albumin: 4 g/dL (ref 3.5–4.8)
BILIRUBIN TOTAL: 0.7 mg/dL (ref 0.0–1.2)
BUN/Creatinine Ratio: 18 (ref 12–28)
BUN: 17 mg/dL (ref 8–27)
CHLORIDE: 104 mmol/L (ref 96–106)
CO2: 26 mmol/L (ref 18–29)
Calcium: 9.3 mg/dL (ref 8.7–10.3)
Creatinine, Ser: 0.93 mg/dL (ref 0.57–1.00)
GFR calc Af Amer: 68 mL/min/{1.73_m2} (ref >59)
GFR calc non Af Amer: 59 mL/min/{1.73_m2} — ABNORMAL LOW (ref >59)
GLUCOSE: 95 mg/dL (ref 65–99)
Globulin, Total: 2.7 g/dL (ref 1.5–4.5)
Potassium: 4.6 mmol/L (ref 3.5–5.2)
Sodium: 144 mmol/L (ref 134–144)
TOTAL PROTEIN: 6.7 g/dL (ref 6.0–8.5)

## 2016-02-10 LAB — VITAMIN B12: VITAMIN B 12: 737 pg/mL (ref 232–1245)

## 2016-02-10 LAB — MAGNESIUM: MAGNESIUM: 2.2 mg/dL (ref 1.6–2.3)

## 2016-02-10 LAB — VITAMIN D 25 HYDROXY (VIT D DEFICIENCY, FRACTURES): Vit D, 25-Hydroxy: 29.9 ng/mL — ABNORMAL LOW (ref 30.0–100.0)

## 2016-02-13 ENCOUNTER — Telehealth: Payer: Self-pay

## 2016-02-13 MED ORDER — CHOLECALCIFEROL 50 MCG (2000 UT) PO TABS: 1.0000 | ORAL_TABLET | Freq: Every day | ORAL | 0 refills | Status: DC

## 2016-02-13 NOTE — Telephone Encounter (Signed)
-----   Message from [name], PA-C sent at 02/13/2016  8:25 AM EST ----- All labs are within normal limits and stable.  HgBA1c has improved to 5.4. Vit D is still borderline low. Make sure to take 2000 IU Vit D daily. Thanks! -JB

## 2016-02-13 NOTE — Telephone Encounter (Signed)
Advised pt of lab results. Pt verbally acknowledges understanding. Added vitamin D to med list. Renaldo Fiddler, CMA

## 2016-02-14 ENCOUNTER — Telehealth: Payer: Self-pay | Admitting: Physician Assistant

## 2016-02-14 NOTE — Telephone Encounter (Signed)
Order for cologuard faxed to [facility] °

## 2016-04-01 ENCOUNTER — Emergency Department: Payer: Medicare Other

## 2016-04-01 ENCOUNTER — Emergency Department
Admission: EM | Admit: 2016-04-01 | Discharge: 2016-04-02 | Disposition: A | Discharge status: Home / Self Care | Payer: Medicare Other | Attending: Emergency Medicine | Admitting: Emergency Medicine

## 2016-04-01 ENCOUNTER — Encounter: Payer: Self-pay | Admitting: *Deleted

## 2016-04-01 DIAGNOSIS — I10 Essential (primary) hypertension: Secondary | ICD-10-CM | POA: Diagnosis not present

## 2016-04-01 DIAGNOSIS — Z79899 Other long term (current) drug therapy: Secondary | ICD-10-CM | POA: Insufficient documentation

## 2016-04-01 DIAGNOSIS — Z7982 Long term (current) use of aspirin: Secondary | ICD-10-CM | POA: Diagnosis not present

## 2016-04-01 DIAGNOSIS — N200 Calculus of kidney: Secondary | ICD-10-CM | POA: Insufficient documentation

## 2016-04-01 DIAGNOSIS — R109 Unspecified abdominal pain: Secondary | ICD-10-CM | POA: Diagnosis present

## 2016-04-01 HISTORY — DX: Disorder of kidney and ureter, unspecified: N28.9

## 2016-04-01 HISTORY — DX: Essential (primary) hypertension: I10

## 2016-04-01 LAB — CBC WITH DIFFERENTIAL/PLATELET
Basophils Absolute: 0 10*3/uL (ref 0–0.1)
Basophils Relative: 0 %
Eosinophils Absolute: 0.1 10*3/uL (ref 0–0.7)
Eosinophils Relative: 1 %
HEMATOCRIT: 33.5 % — AB (ref 35.0–47.0)
HEMOGLOBIN: 12.1 g/dL (ref 12.0–16.0)
LYMPHS ABS: 1.2 10*3/uL (ref 1.0–3.6)
Lymphocytes Relative: 14 %
MCH: 31.8 pg (ref 26.0–34.0)
MCHC: 36.2 g/dL — AB (ref 32.0–36.0)
MCV: 88 fL (ref 80.0–100.0)
MONO ABS: 0.5 10*3/uL (ref 0.2–0.9)
MONOS PCT: 5 %
NEUTROS PCT: 80 %
Neutro Abs: 7.2 10*3/uL — ABNORMAL HIGH (ref 1.4–6.5)
Platelets: 211 10*3/uL (ref 150–440)
RBC: 3.8 MIL/uL (ref 3.80–5.20)
RDW: 13.5 % (ref 11.5–14.5)
WBC: 9 10*3/uL (ref 3.6–11.0)

## 2016-04-01 LAB — LIPASE, BLOOD: Lipase: 55 U/L — ABNORMAL HIGH (ref 11–51)

## 2016-04-01 LAB — COMPREHENSIVE METABOLIC PANEL
ALK PHOS: 79 U/L (ref 38–126)
ALT: 16 U/L (ref 14–54)
ANION GAP: 11 (ref 5–15)
AST: 25 U/L (ref 15–41)
Albumin: 4 g/dL (ref 3.5–5.0)
BILIRUBIN TOTAL: 1 mg/dL (ref 0.3–1.2)
BUN: 17 mg/dL (ref 6–20)
CALCIUM: 8.8 mg/dL — AB (ref 8.9–10.3)
CO2: 22 mmol/L (ref 22–32)
Chloride: 103 mmol/L (ref 101–111)
Creatinine, Ser: 0.95 mg/dL (ref 0.44–1.00)
GFR, EST NON AFRICAN AMERICAN: 56 mL/min — AB (ref >60)
GLUCOSE: 150 mg/dL — AB (ref 65–99)
Potassium: 3.7 mmol/L (ref 3.5–5.1)
Sodium: 136 mmol/L (ref 135–145)
Total Protein: 7.4 g/dL (ref 6.5–8.1)

## 2016-04-01 MED ORDER — FENTANYL CITRATE (PF) 100 MCG/2ML IJ SOLN
50.0000 ug | Freq: Once | INTRAMUSCULAR | Status: AC
  Administered 2016-04-01: 50 ug via INTRAVENOUS
  Filled 2016-04-01: qty 2

## 2016-04-01 MED ORDER — ONDANSETRON HCL 4 MG/2ML IJ SOLN
4.0000 mg | Freq: Once | INTRAMUSCULAR | Status: AC
  Administered 2016-04-01: 4 mg via INTRAVENOUS
  Filled 2016-04-01: qty 2

## 2016-04-01 MED ORDER — SODIUM CHLORIDE 0.9 % IV BOLUS (SEPSIS)
1000.0000 mL | Freq: Once | INTRAVENOUS | Status: AC
  Administered 2016-04-01: 1000 mL via INTRAVENOUS

## 2016-04-01 MED ORDER — MORPHINE SULFATE (PF) 4 MG/ML IV SOLN
4.0000 mg | Freq: Once | INTRAVENOUS | Status: AC
  Administered 2016-04-01: 4 mg via INTRAVENOUS
  Filled 2016-04-01: qty 1

## 2016-04-01 NOTE — ED Triage Notes (Signed)
Pt presents via EMS w/ c/o R Flank pain, persistent nausea, vomiting x 1. Pt w/ hx of kidney stones in distant past. Pt denies urgency, frequency, and hematuria. Pt states sudden onset at 54 today.

## 2016-04-02 LAB — URINALYSIS, COMPLETE (UACMP) WITH MICROSCOPIC
Bacteria, UA: NONE SEEN
Bilirubin Urine: NEGATIVE
GLUCOSE, UA: NEGATIVE mg/dL
HGB URINE DIPSTICK: NEGATIVE
Ketones, ur: 5 mg/dL — AB
Leukocytes, UA: NEGATIVE
Nitrite: NEGATIVE
PH: 8 (ref 5.0–8.0)
Protein, ur: NEGATIVE mg/dL
SPECIFIC GRAVITY, URINE: 1.008 (ref 1.005–1.030)

## 2016-04-02 LAB — LACTIC ACID, PLASMA: Lactic Acid, Venous: 1.3 mmol/L (ref 0.5–1.9)

## 2016-04-02 MED ORDER — KETOROLAC TROMETHAMINE 30 MG/ML IJ SOLN
30.0000 mg | Freq: Once | INTRAMUSCULAR | Status: AC
  Administered 2016-04-02: 30 mg via INTRAVENOUS
  Filled 2016-04-02: qty 1

## 2016-04-02 MED ORDER — TAMSULOSIN HCL 0.4 MG PO CAPS: 0.4000 mg | ORAL_CAPSULE | Freq: Every day | ORAL | 0 refills | Status: DC

## 2016-04-02 MED ORDER — ONDANSETRON 4 MG PO TBDP: 4.0000 mg | ORAL_TABLET | Freq: Three times a day (TID) | ORAL | 0 refills | Status: DC | PRN

## 2016-04-02 MED ORDER — HYDROCODONE-ACETAMINOPHEN 5-325 MG PO TABS: 1.0000 | ORAL_TABLET | ORAL | 0 refills | Status: DC | PRN

## 2016-04-02 NOTE — ED Provider Notes (Signed)
[facility] Emergency Department Provider Note   ____________________________________________   First MD Initiated Contact with Patient 04/01/16 2315     (approximate)  I have reviewed the triage vital signs and the nursing notes.   HISTORY  Chief Complaint Flank Pain (right) and Nausea    HPI Gina Middleton is a 81 y.o. female who comes into the hospital today with severe pain to her right abdomen going around her back. It started around 7 PM tonight. The patient usually takes aspirin at night but it did not help with her pain. She started vomiting after she took her aspirin. The pain is very rough and the patient rates it a 9 out of 10 in intensity. He is also had some nausea and vomiting. The patient was given a dose of fentanyl in triage and it helped but the pain is returning. She denies any chest pain, shortness of breath. She did have a kidney stone 34 years ago but she reports that it was not this severe and she didn't have any vomiting with it. The patient came into the hospital today for evaluation.the patient has not had any pain when she is any blood in her urine or any other concerns.   Past Medical History:  Diagnosis Date  . Hypertension   . Renal disorder     Patient Active Problem List   Diagnosis Date Noted  . Achilles tendonitis 05/12/2015  . Allergic rhinitis 09/20/2014  . Benign paroxysmal positional nystagmus 09/20/2014  . Blood glucose elevated 09/20/2014  . Neuralgia neuritis, sciatic nerve 09/20/2014  . Vegetarian 09/20/2014  . B12 deficiency 09/20/2014  . Avitaminosis D 09/20/2014  . Hypertension 09/07/2014    Past Surgical History:  Procedure Laterality Date  . APPENDECTOMY    . [place]  . CHOLECYSTECTOMY      Prior to Admission medications   Medication Sig Start Date End Date Taking? Authorizing Provider  aspirin 81 MG tablet Take by mouth. 02/16/10   Historical Provider, MD  Cholecalciferol  2000 units TABS Take 1 tablet (2,000 Units total) by mouth daily. 02/13/16   [name], PA-C  CRANBERRY PO Take by mouth.    Historical Provider, MD  Cyanocobalamin 1000 MCG TBCR Take by mouth. 05/13/11   Historical Provider, MD  hydrochlorothiazide (HYDRODIURIL) 25 MG tablet Take 0.5 tablets (12.5 mg total) by mouth daily. 02/08/16   [name], PA-C  HYDROcodone-acetaminophen (NORCO/VICODIN) 5-325 MG tablet Take 1 tablet by mouth every 4 (four) hours as needed for moderate pain. 04/02/16 04/02/17  Loney Hering, MD  lisinopril (PRINIVIL,ZESTRIL) 10 MG tablet Take 1 tablet (10 mg total) by mouth daily. 05/12/15   Margarita Rana, MD  meloxicam (MOBIC) 15 MG tablet Take 7.5 mg by mouth as needed.  04/15/14   Historical Provider, MD  Omega-3 Fatty Acids (FISH OIL) 1000 MG CPDR Take by mouth. 02/16/10   Historical Provider, MD  ondansetron (ZOFRAN ODT) 4 MG disintegrating tablet Take 1 tablet (4 mg total) by mouth every 8 (eight) hours as needed for nausea or vomiting. 04/02/16   Loney Hering, MD  tamsulosin (FLOMAX) 0.4 MG CAPS capsule Take 1 capsule (0.4 mg total) by mouth daily. 04/02/16   Loney Hering, MD  Vitamin D, Ergocalciferol, (DRISDOL) 50000 UNITS CAPS capsule Take by mouth. 02/16/10   Historical Provider, MD    Allergies Iodinated diagnostic agents and Oxycodone-acetaminophen  Family History  Problem Relation Age of Onset  . Hypertension Mother   .  Alzheimer's disease Mother   . Heart attack Father     x's 3  . Diabetes Sister   . Heart attack Sister   . Breast cancer Maternal Grandmother     Social History Social History  Substance Use Topics  . Smoking status: Never Smoker  . Smokeless tobacco: Never Used  . Alcohol use No    Review of Systems Constitutional: No fever/chills Eyes: No visual changes. ENT: No sore throat. Cardiovascular: Denies chest pain. Respiratory: Denies shortness of breath. Gastrointestinal: abdominal pain, nausea,  vomiting.  No diarrhea.  No constipation. Genitourinary: Negative for dysuria. Musculoskeletal:  back pain. Skin: Negative for rash. Neurological: Negative for headaches, focal weakness or numbness.  10-point ROS otherwise negative.  ____________________________________________   PHYSICAL EXAM:  VITAL SIGNS: ED Triage Vitals  Enc Vitals Group     BP 04/01/16 2234 (!) 196/93     Pulse Rate 04/01/16 2234 77     Resp 04/01/16 2234 15     Temp 04/01/16 2234 98.4 F (36.9 C)     Temp Source 04/01/16 2234 Oral     SpO2 04/01/16 2234 97 %     Weight 04/01/16 2232 130 lb (59 kg)     Height 04/01/16 2232 5\' 3"  (1.6 m)     Head Circumference --      Peak Flow --      Pain Score 04/01/16 2232 10     Pain Loc --      Pain Edu? --      Excl. in [place]? --     Constitutional: Alert and oriented. The patient is in some moderate distress and appears in some significant pain Eyes: Conjunctivae are normal. PERRL. EOMI. Head: Atraumatic. Nose: No congestion/rhinnorhea. Mouth/Throat: Mucous membranes are moist.  Oropharynx non-erythematous. Cardiovascular: Normal rate, regular rhythm. Grossly normal heart sounds.  Good peripheral circulation. Respiratory: Normal respiratory effort.  No retractions. Lungs CTAB. Gastrointestinal: Soft with some right sided abd pain. Mild distention. Positive bowel sounds, CVA tenderness Musculoskeletal: No lower extremity tenderness nor edema.  No joint effusions. Neurologic:  Normal speech and language. No gross focal neurologic deficits are appreciated. No gait instability. Skin:  Skin is warm, dry and intact. No rash noted. Psychiatric: Mood and affect are normal. Speech and behavior are normal.  ____________________________________________   LABS (all labs ordered are listed, but only abnormal results are displayed)  Labs Reviewed  URINALYSIS, COMPLETE (UACMP) WITH MICROSCOPIC - Abnormal; Notable for the following:       Result Value   Color, Urine  COLORLESS (*)    APPearance CLEAR (*)    Ketones, ur 5 (*)    Squamous Epithelial / LPF 0-5 (*)    All other components within normal limits  CBC WITH DIFFERENTIAL/PLATELET - Abnormal; Notable for the following:    HCT 33.5 (*)    MCHC 36.2 (*)    Neutro Abs 7.2 (*)    All other components within normal limits  COMPREHENSIVE METABOLIC PANEL - Abnormal; Notable for the following:    Glucose, Bld 150 (*)    Calcium 8.8 (*)    GFR calc non Af Amer 56 (*)    All other components within normal limits  LIPASE, BLOOD - Abnormal; Notable for the following:    Lipase 55 (*)    All other components within normal limits  LACTIC ACID, PLASMA   ____________________________________________  EKG  none ____________________________________________  RADIOLOGY  CT renal stone study ____________________________________________   PROCEDURES  Procedure(s) performed: None  Procedures  Critical Care performed: No  ____________________________________________   INITIAL IMPRESSION / ASSESSMENT AND PLAN / ED COURSE  Pertinent labs & imaging results that were available during my care of the patient were reviewed by me and considered in my medical decision making (see chart for details).  This is a 81 year old female who comes into the hospital today with some right-sided flank pain and abdominal pain. The patient was actively vomiting when I did check on her side gave her a dose of Zofran as well as some morphine for pain. I did send the patient for a CT scan of her kidneys and is shows some right sided proximal hydroureteronephrosis with a 3 mm UPJ stone. The patient's blood work is unremarkable and I did wait for the urinalysis to return. The patient received a dose of Toradol as well as a liter of normal saline. I did check a lactic acid given the patient's mild abdominal distention and pain as well which was negative. The patient was much more comfortable when I did go back in to reassess  her. She'll be discharged home to follow-up with a urologist for further evaluation of this kidney stone.  Clinical Course as of Apr 03 511  Tue Apr 02, 2016  0031 1. Mild-to-moderate right-sided proximal hydroureteronephrosis secondary to a 3 mm UPJ stone. Nonobstructing interpolar and right lower pole renal calculi. Left-sided parapelvic renal cysts. 2. Cholecystectomy and appendectomy. 3. Aortic atherosclerosis. 4. Multilevel degenerative disc space narrowing of the lumbar spine consistent with lumbar spondylosis.   CT Renal Laren Everts [AW]    Clinical Course User Index [AW] Loney Hering, MD     ____________________________________________   FINAL CLINICAL IMPRESSION(S) / ED DIAGNOSES  Final diagnoses:  Kidney stone      NEW MEDICATIONS STARTED DURING THIS VISIT:  New Prescriptions   HYDROCODONE-ACETAMINOPHEN (NORCO/VICODIN) 5-325 MG TABLET    Take 1 tablet by mouth every 4 (four) hours as needed for moderate pain.   ONDANSETRON (ZOFRAN ODT) 4 MG DISINTEGRATING TABLET    Take 1 tablet (4 mg total) by mouth every 8 (eight) hours as needed for nausea or vomiting.   TAMSULOSIN (FLOMAX) 0.4 MG CAPS CAPSULE    Take 1 capsule (0.4 mg total) by mouth daily.     Note:  This document was prepared using Dragon voice recognition software and may include unintentional dictation errors.    Loney Hering, MD 04/02/16 [phone]

## 2016-04-02 NOTE — Discharge Instructions (Signed)
Please follow-up with urology for further evaluation of this kidney stone.

## 2016-04-05 ENCOUNTER — Ambulatory Visit: Payer: Medicare Other | Admitting: Urology

## 2016-04-05 ENCOUNTER — Encounter: Payer: Self-pay | Admitting: Urology

## 2016-04-05 VITALS — BP 177/88 | HR 93 | Ht 63.0 in | Wt 124.4 lb

## 2016-04-05 DIAGNOSIS — N1330 Unspecified hydronephrosis: Secondary | ICD-10-CM | POA: Diagnosis not present

## 2016-04-05 DIAGNOSIS — N200 Calculus of kidney: Secondary | ICD-10-CM | POA: Diagnosis not present

## 2016-04-05 LAB — URINALYSIS, COMPLETE
Bilirubin, UA: NEGATIVE
GLUCOSE, UA: NEGATIVE
KETONES UA: NEGATIVE
Nitrite, UA: NEGATIVE
Urobilinogen, Ur: 0.2 mg/dL (ref 0.2–1.0)
pH, UA: 6 (ref 5.0–7.5)

## 2016-04-05 LAB — MICROSCOPIC EXAMINATION
RBC, UA: >30 /hpf — AB (ref 0–?)
WBC, UA: >30 /hpf — AB (ref 0–?)

## 2016-04-05 NOTE — Progress Notes (Signed)
04/05/2016 9:58 AM   Gina Middleton [birth year] [id number]  Referring provider: [name], PA-C [place], [place] [address]  Chief Complaint  Patient presents with  . New Patient (Initial Visit)    UPJ stone     HPI: The patient is a 81 year old female who was recently seen in the emergency Department for right flank pain and was diagnosed with a 3 mm right UPJ stone with proximal hydroureteronephrosis. She also had 2 punctate stones on the right side and bilateral simple renal cysts. Her pain has been fairly well controlled since discharge from the ER. She is currently only taking ibuprofen as the Norco prescribed made her dizzy. She has not strained her urine. She does have mild symptoms at this time as well as new onset bladder spasms was morning. She denies fevers or chills. No current nausea or vomiting.  She does have history of a kidney stone over 3 decades ago. This spontaneously passed.  PMH: Past Medical History:  Diagnosis Date  . Hypertension   . Renal disorder     Surgical History: Past Surgical History:  Procedure Laterality Date  . APPENDECTOMY    . [facility]  . CHOLECYSTECTOMY      Home Medications:  Allergies as of 04/05/2016      Reactions   Hydrocodone-acetaminophen Other (See Comments)   dizziness   Iodinated Diagnostic Agents    Oxycodone-acetaminophen       Medication List       Accurate as of 04/05/16  9:58 AM. Always use your most recent med list.          aspirin 81 MG tablet Take by mouth.   Cholecalciferol 2000 units Tabs Take 1 tablet (2,000 Units total) by mouth daily.   CRANBERRY PO Take by mouth.   Cyanocobalamin 1000 MCG Tbcr Take by mouth.   Fish Oil 1000 MG Cpdr Take by mouth.   hydrochlorothiazide 25 MG tablet Commonly known as:  HYDRODIURIL Take 0.5 tablets (12.5 mg total) by mouth daily.   HYDROcodone-acetaminophen 5-325 MG tablet Commonly known as:   NORCO/VICODIN Take 1 tablet by mouth every 4 (four) hours as needed for moderate pain.   ibuprofen 200 MG tablet Commonly known as:  ADVIL,MOTRIN Take 200 mg by mouth every 6 (six) hours as needed.   lisinopril 10 MG tablet Commonly known as:  PRINIVIL,ZESTRIL Take 1 tablet (10 mg total) by mouth daily.   meloxicam 15 MG tablet Commonly known as:  MOBIC Take 7.5 mg by mouth as needed.   ondansetron 4 MG disintegrating tablet Commonly known as:  ZOFRAN ODT Take 1 tablet (4 mg total) by mouth every 8 (eight) hours as needed for nausea or vomiting.   tamsulosin 0.4 MG Caps capsule Commonly known as:  FLOMAX Take 1 capsule (0.4 mg total) by mouth daily.   Vitamin D (Ergocalciferol) 50000 units Caps capsule Commonly known as:  DRISDOL Take by mouth.       Allergies:  Allergies  Allergen Reactions  . Hydrocodone-Acetaminophen Other (See Comments)    dizziness  . Iodinated Diagnostic Agents   . Oxycodone-Acetaminophen     Family History: Family History  Problem Relation Age of Onset  . Hypertension Mother   . Alzheimer's disease Mother   . Heart attack Father     x's 3  . Diabetes Sister   . Heart attack Sister   . Breast cancer Maternal Grandmother     Social History:  reports  that she has never smoked. She has never used smokeless tobacco. She reports that she does not drink alcohol or use drugs.  ROS: UROLOGY Frequent Urination?: Yes Hard to postpone urination?: No Burning/pain with urination?: Yes Get up at night to urinate?: No Leakage of urine?: No Urine stream starts and stops?: No Trouble starting stream?: No Do you have to strain to urinate?: No Blood in urine?: Yes Urinary tract infection?: Yes Sexually transmitted disease?: No Injury to kidneys or bladder?: No Painful intercourse?: No Weak stream?: No Currently pregnant?: No Vaginal bleeding?: No Last menstrual period?: n  Gastrointestinal Nausea?: Yes Vomiting?:  Yes Indigestion/heartburn?: No Diarrhea?: No Constipation?: No  Constitutional Fever: No Night sweats?: No Weight loss?: No Fatigue?: No  Skin Skin rash/lesions?: No Itching?: No  Eyes Blurred vision?: No Double vision?: No  Ears/Nose/Throat Sore throat?: No Sinus problems?: No  Hematologic/Lymphatic Swollen glands?: No Easy bruising?: No  Cardiovascular Leg swelling?: No Chest pain?: No  Respiratory Cough?: No Shortness of breath?: No  Endocrine Excessive thirst?: No  Musculoskeletal Back pain?: No Joint pain?: No  Neurological Headaches?: No Dizziness?: Yes  Psychologic Depression?: No Anxiety?: No  Physical Exam: BP (!) 177/88   Pulse 93   Ht 5\' 3"  (1.6 m)   Wt 124 lb 6.4 oz (56.4 kg)   BMI 22.04 kg/m   Constitutional:  Alert and oriented, No acute distress. HEENT: [place] AT, moist mucus membranes.  Trachea midline, no masses. Cardiovascular: No clubbing, cyanosis, or edema. Respiratory: Normal respiratory effort, no increased work of breathing. GI: Abdomen is soft, nontender, nondistended, no abdominal masses GU: No CVA tenderness.  Skin: No rashes, bruises or suspicious lesions. Lymph: No cervical or inguinal adenopathy. Neurologic: Grossly intact, no focal deficits, moving all 4 extremities. Psychiatric: Normal mood and affect.  Laboratory Data: Lab Results  Component Value Date   WBC 9.0 04/01/2016   HGB 12.1 04/01/2016   HCT 33.5 (L) 04/01/2016   MCV 88.0 04/01/2016   PLT 211 04/01/2016    Lab Results  Component Value Date   CREATININE 0.95 04/01/2016    No results found for: PSA  No results found for: TESTOSTERONE  Lab Results  Component Value Date   HGBA1C 5.4 02/09/2016    Urinalysis    Component Value Date/Time   COLORURINE COLORLESS (A) 04/01/2016 2234   APPEARANCEUR CLEAR (A) 04/01/2016 2234   LABSPEC 1.008 04/01/2016 2234   PHURINE 8.0 04/01/2016 2234   GLUCOSEU NEGATIVE 04/01/2016 2234   HGBUR NEGATIVE  04/01/2016 2234   BILIRUBINUR NEGATIVE 04/01/2016 2234   BILIRUBINUR negative 11/04/2014 1336   KETONESUR 5 (A) 04/01/2016 2234   PROTEINUR NEGATIVE 04/01/2016 2234   UROBILINOGEN 0.2 11/04/2014 1336   NITRITE NEGATIVE 04/01/2016 2234   LEUKOCYTESUR NEGATIVE 04/01/2016 2234    Pertinent Imaging: CT scan reviewed as above.  Assessment & Plan:    1. 3 mm proximal right ureteral calculus 2. Right hydroureteronephrosis 3. Punctate right nonobstructing stones 4. Bilateral renal cysts Since the patient's pain is well controlled at this point, I think she would be a good candidate continue medical expulsive therapy with Flomax, pain control, and straining of urine. Her new onset frequency and bladder spasms suggest a stone may be now in the distal ureter. We did briefly discuss lithotripsy and ureteroscopy, but she is interested in avoiding surgery at this time which I think is very reasonable. Due to the small stone size, we will have her follow-up with a renal ultrasound and KUB in 2 weeks. She  will contact the office present to the ER if she develops uncontrolled flank pain, uncontrolled vomiting, or fevers.  Return in about 2 weeks (around 04/19/2016) for KUB, ultrasound prior.  Nickie Retort, MD  [facility] [address] [place], [place] [address] [phone]

## 2016-04-22 ENCOUNTER — Ambulatory Visit (HOSPITAL_COMMUNITY): Payer: Medicare Other

## 2016-04-22 ENCOUNTER — Ambulatory Visit
Admission: RE | Admit: 2016-04-22 | Discharge: 2016-04-22 | Disposition: A | Discharge status: Home / Self Care | Payer: Medicare Other | Source: Ambulatory Visit | Attending: Urology | Admitting: Urology

## 2016-04-22 DIAGNOSIS — N1330 Unspecified hydronephrosis: Secondary | ICD-10-CM

## 2016-04-22 DIAGNOSIS — N200 Calculus of kidney: Secondary | ICD-10-CM

## 2016-04-22 DIAGNOSIS — N281 Cyst of kidney, acquired: Secondary | ICD-10-CM | POA: Insufficient documentation

## 2016-04-25 ENCOUNTER — Ambulatory Visit: Payer: Medicare Other | Admitting: Urology

## 2016-04-25 ENCOUNTER — Encounter: Payer: Self-pay | Admitting: Urology

## 2016-04-25 VITALS — BP 190/83 | HR 58 | Ht 63.0 in | Wt 125.9 lb

## 2016-04-25 DIAGNOSIS — N201 Calculus of ureter: Secondary | ICD-10-CM | POA: Diagnosis not present

## 2016-04-25 NOTE — Progress Notes (Signed)
04/25/2016 9:12 AM   Gina Middleton [birth year] [id number]  Referring provider: [name], PA-C [place], [place] [address]  Chief Complaint  Patient presents with  . Nephrolithiasis    HPI: The patient is a 81 year old female who was recently seen in the emergency Department for right flank pain and was diagnosed with a 3 mm right UPJ stone with proximal hydroureteronephrosis.  Her symptoms have now completely resolved. Her urinary urgency has passed. She no longer has flank pain. She denies nausea vomiting fevers or chills. She was unable to catch the stone though.  She does have history of a kidney stone over 3 decades ago. This spontaneously passed.  KUB from 3 days ago shows no obvious stone burden. Renal ultrasound shows no residual hydronephrosis on the right and bilateral ureteral jets.   PMH: Past Medical History:  Diagnosis Date  . Hypertension   . Renal disorder     Surgical History: Past Surgical History:  Procedure Laterality Date  . APPENDECTOMY    . [place]  . CHOLECYSTECTOMY      Home Medications:  Allergies as of 04/25/2016      Reactions   Hydrocodone-acetaminophen Other (See Comments)   dizziness   Iodinated Diagnostic Agents    Oxycodone-acetaminophen       Medication List       Accurate as of 04/25/16  9:12 AM. Always use your most recent med list.          aspirin 81 MG tablet Take by mouth.   Cholecalciferol 2000 units Tabs Take 1 tablet (2,000 Units total) by mouth daily.   CRANBERRY PO Take by mouth.   Cyanocobalamin 1000 MCG Tbcr Take by mouth.   Fish Oil 1000 MG Cpdr Take by mouth.   hydrochlorothiazide 25 MG tablet Commonly known as:  HYDRODIURIL Take 0.5 tablets (12.5 mg total) by mouth daily.   HYDROcodone-acetaminophen 5-325 MG tablet Commonly known as:  NORCO/VICODIN Take 1 tablet by mouth every 4 (four) hours as needed for moderate pain.   ibuprofen 200 MG  tablet Commonly known as:  ADVIL,MOTRIN Take 200 mg by mouth every 6 (six) hours as needed.   lisinopril 10 MG tablet Commonly known as:  PRINIVIL,ZESTRIL Take 1 tablet (10 mg total) by mouth daily.   meloxicam 15 MG tablet Commonly known as:  MOBIC Take 7.5 mg by mouth as needed.   ondansetron 4 MG disintegrating tablet Commonly known as:  ZOFRAN ODT Take 1 tablet (4 mg total) by mouth every 8 (eight) hours as needed for nausea or vomiting.   tamsulosin 0.4 MG Caps capsule Commonly known as:  FLOMAX Take 1 capsule (0.4 mg total) by mouth daily.   Vitamin D (Ergocalciferol) 50000 units Caps capsule Commonly known as:  DRISDOL Take by mouth.       Allergies:  Allergies  Allergen Reactions  . Hydrocodone-Acetaminophen Other (See Comments)    dizziness  . Iodinated Diagnostic Agents   . Oxycodone-Acetaminophen     Family History: Family History  Problem Relation Age of Onset  . Hypertension Mother   . Alzheimer's disease Mother   . Heart attack Father     x's 3  . Diabetes Sister   . Heart attack Sister   . Breast cancer Maternal Grandmother     Social History:  reports that she has never smoked. She has never used smokeless tobacco. She reports that she does not drink alcohol or use drugs.  ROS:  UROLOGY Frequent Urination?: No Hard to postpone urination?: No Burning/pain with urination?: No Get up at night to urinate?: No Leakage of urine?: No Urine stream starts and stops?: No Trouble starting stream?: No Do you have to strain to urinate?: No Blood in urine?: No Urinary tract infection?: No Sexually transmitted disease?: No Injury to kidneys or bladder?: No Painful intercourse?: No Weak stream?: No Currently pregnant?: No Vaginal bleeding?: No Last menstrual period?: n  Gastrointestinal Nausea?: No Vomiting?: No Indigestion/heartburn?: No Diarrhea?: No Constipation?: No  Constitutional Fever: No Night sweats?: No Weight loss?:  No Fatigue?: No  Skin Skin rash/lesions?: No Itching?: No  Eyes Blurred vision?: No Double vision?: No  Ears/Nose/Throat Sore throat?: No Sinus problems?: No  Hematologic/Lymphatic Swollen glands?: No Easy bruising?: No  Cardiovascular Leg swelling?: No Chest pain?: No  Respiratory Cough?: No Shortness of breath?: No  Endocrine Excessive thirst?: No  Musculoskeletal Back pain?: No Joint pain?: No  Neurological Headaches?: No Dizziness?: No  Psychologic Depression?: No Anxiety?: No  Physical Exam: BP (!) 190/83 (BP Location: Right Arm, Patient Position: Sitting, Cuff Size: Normal)   Pulse (!) 58   Ht 5\' 3"  (1.6 m)   Wt 125 lb 14.4 oz (57.1 kg)   BMI 22.30 kg/m   Constitutional:  Alert and oriented, No acute distress. HEENT: [place] AT, moist mucus membranes.  Trachea midline, no masses. Cardiovascular: No clubbing, cyanosis, or edema. Respiratory: Normal respiratory effort, no increased work of breathing. GI: Abdomen is soft, nontender, nondistended, no abdominal masses GU: No CVA tenderness.  Skin: No rashes, bruises or suspicious lesions. Lymph: No cervical or inguinal adenopathy. Neurologic: Grossly intact, no focal deficits, moving all 4 extremities. Psychiatric: Normal mood and affect.  Laboratory Data: Lab Results  Component Value Date   WBC 9.0 04/01/2016   HGB 12.1 04/01/2016   HCT 33.5 (L) 04/01/2016   MCV 88.0 04/01/2016   PLT 211 04/01/2016    Lab Results  Component Value Date   CREATININE 0.95 04/01/2016    No results found for: PSA  No results found for: TESTOSTERONE  Lab Results  Component Value Date   HGBA1C 5.4 02/09/2016    Urinalysis    Component Value Date/Time   COLORURINE COLORLESS (A) 04/01/2016 2234   APPEARANCEUR Cloudy (A) 04/05/2016 0924   LABSPEC 1.008 04/01/2016 2234   PHURINE 8.0 04/01/2016 2234   GLUCOSEU Negative 04/05/2016 0924   HGBUR NEGATIVE 04/01/2016 2234   BILIRUBINUR Negative 04/05/2016 0924    KETONESUR 5 (A) 04/01/2016 2234   PROTEINUR 2+ (A) 04/05/2016 0924   PROTEINUR NEGATIVE 04/01/2016 2234   UROBILINOGEN 0.2 11/04/2014 1336   NITRITE Negative 04/05/2016 0924   NITRITE NEGATIVE 04/01/2016 2234   LEUKOCYTESUR 2+ (A) 04/05/2016 0924    Pertinent Imaging: KUB and renal u/s reviewed as above.  Assessment & Plan:    1. Right ureteral stone Joaquim Lai has apparently passed. This is only the patient's second stone in her lifetime with the first being 40 years ago. I do not think she needs further workup at this time. We did discuss ways to prevent stones in the future especially with oral hydration. She'll follow-up with [place] as needed.  Return if symptoms worsen or fail to improve.  Nickie Retort, MD  [facility] [address] [place], [place] [address] [phone]

## 2016-06-20 ENCOUNTER — Other Ambulatory Visit: Payer: Self-pay

## 2016-06-20 DIAGNOSIS — I10 Essential (primary) hypertension: Secondary | ICD-10-CM

## 2016-06-20 MED ORDER — LISINOPRIL 10 MG PO TABS: 10.0000 mg | ORAL_TABLET | Freq: Every day | ORAL | 3 refills | Status: DC

## 2016-07-30 ENCOUNTER — Ambulatory Visit (INDEPENDENT_AMBULATORY_CARE_PROVIDER_SITE_OTHER): Payer: Medicare Other | Admitting: Physician Assistant

## 2016-07-30 ENCOUNTER — Encounter: Payer: Self-pay | Admitting: Physician Assistant

## 2016-07-30 VITALS — BP 180/100 | HR 68 | Temp 98.2°F | Resp 16 | Ht 63.0 in | Wt 125.4 lb

## 2016-07-30 DIAGNOSIS — I10 Essential (primary) hypertension: Secondary | ICD-10-CM | POA: Diagnosis not present

## 2016-07-30 MED ORDER — AMLODIPINE BESYLATE 5 MG PO TABS: 5.0000 mg | ORAL_TABLET | Freq: Every day | ORAL | 0 refills | Status: DC

## 2016-07-30 NOTE — Progress Notes (Signed)
Patient: Gina Middleton Female    DOB: 06-Jun-1935   81 y.o.   MRN: [id number] Visit Date: 07/30/2016  Today's Provider: [name], PA-C   Chief Complaint  Patient presents with  . Hypertension   Subjective:    Patient here today with concernes about side effects from Lisinopril. Patient reports hair is thinning out and is concerned that Lisinopril is causing this. Patient reports that she has been taking Lisinopril every other day or 1/2 tablet at times for 3 weeks.     Hypertension, follow-up:  BP Readings from Last 3 Encounters:  07/30/16 (!) 180/100  04/25/16 (!) 190/83  04/05/16 (!) 177/88    She was last seen for hypertension 6 months ago.  BP at that visit was 140/88 . Management changes since that visit include add HCTZ 12.5 mg separate to see if it will cause bladder spasm again, check labs. She reports fair compliance with treatment. She is having side effects. Hair thinnning She is exercising. She is adherent to low salt diet.   Outside blood pressures are not being checked. She is experiencing near-syncope. Patient reports that she had one episode of dizzyness in April. Patient denies chest pain and lower extremity edema.   Cardiovascular risk factors include advanced age (older than 31 for men, 68 for women) and hypertension.  Use of agents associated with hypertension: none.    Weight trend: stable Wt Readings from Last 3 Encounters:  07/30/16 125 lb 6.4 oz (56.9 kg)  04/25/16 125 lb 14.4 oz (57.1 kg)  04/05/16 124 lb 6.4 oz (56.4 kg)   Current diet: in general, a "healthy" diet   ------------------------------------------------------------------------     Allergies  Allergen Reactions  . Hydrocodone-Acetaminophen Other (See Comments)    dizziness  . Iodinated Diagnostic Agents   . Oxycodone-Acetaminophen      Current Outpatient Prescriptions:  .  aspirin 81 MG tablet, Take by mouth., Disp: , Rfl:  .  CRANBERRY PO, Take  by mouth., Disp: , Rfl:  .  Cyanocobalamin 1000 MCG TBCR, Take by mouth., Disp: , Rfl:  .  ibuprofen (ADVIL,MOTRIN) 200 MG tablet, Take 200 mg by mouth every 6 (six) hours as needed., Disp: , Rfl:  .  lisinopril (PRINIVIL,ZESTRIL) 10 MG tablet, Take 1 tablet (10 mg total) by mouth daily., Disp: 90 tablet, Rfl: 3 .  Omega-3 Fatty Acids (FISH OIL) 1000 MG CPDR, Take by mouth., Disp: , Rfl:  .  hydrochlorothiazide (HYDRODIURIL) 25 MG tablet, Take 0.5 tablets (12.5 mg total) by mouth daily. (Patient not taking: Reported on 07/30/2016), Disp: 45 tablet, Rfl: 3  Review of Systems  Constitutional: Negative.   Respiratory: Negative.   Cardiovascular: Negative.   Gastrointestinal: Negative.   Skin:       Hair thinning  Neurological: Positive for dizziness. Negative for light-headedness, numbness and headaches.    Social History  Substance Use Topics  . Smoking status: Never Smoker  . Smokeless tobacco: Never Used  . Alcohol use No   Objective:   BP (!) 180/100 (BP Location: Left Arm, Patient Position: Sitting, Cuff Size: Normal) Comment: no medications for two days  Pulse 68   Temp 98.2 F (36.8 C) (Oral)   Resp 16   Ht 5\' 3"  (1.6 m)   Wt 125 lb 6.4 oz (56.9 kg)   SpO2 98%   BMI 22.21 kg/m  Vitals:   07/30/16 0858  BP: (!) 180/100  Pulse: 68  Resp: 16  Temp: 98.2  F (36.8 C)  TempSrc: Oral  SpO2: 98%  Weight: 125 lb 6.4 oz (56.9 kg)  Height: 5\' 3"  (1.6 m)     Physical Exam  Constitutional: She appears well-developed and well-nourished. No distress.  Neck: Normal range of motion. Neck supple. No JVD present. No tracheal deviation present. No thyromegaly present.  Cardiovascular: Normal rate, regular rhythm and normal heart sounds.  Exam reveals no gallop and no friction rub.   No murmur heard. Pulmonary/Chest: Effort normal and breath sounds normal. No respiratory distress. She has no wheezes. She has no rales.  Musculoskeletal: She exhibits no edema.  Lymphadenopathy:     She has no cervical adenopathy.  Skin: She is not diaphoretic.  Vitals reviewed.       Assessment & Plan:     1. Essential hypertension Will change lisinopril to amlodipine as below. Patient refuses to use a higher dose stating "my body just seems to fight medications." I will see her back in 2-4 weeks to recheck her BP and see how she is tolerating the medication.  - amLODipine (NORVASC) 5 MG tablet; Take 1 tablet (5 mg total) by mouth daily.  Dispense: 30 tablet; Refill: 0       [name], PA-C  [employer]

## 2016-07-30 NOTE — Patient Instructions (Addendum)
[place] for vertigo  Amlodipine tablets What is this medicine? AMLODIPINE (am LOE di peen) is a calcium-channel blocker. It affects the amount of calcium found in your heart and muscle cells. This relaxes your blood vessels, which can reduce the amount of work the heart has to do. This medicine is used to lower high blood pressure. It is also used to prevent chest pain. This medicine may be used for other purposes; ask your health care provider or pharmacist if you have questions. COMMON BRAND NAME(S): Norvasc What should I tell my health care provider before I take this medicine? They need to know if you have any of these conditions: -heart problems like heart failure or aortic stenosis -liver disease -an unusual or allergic reaction to amlodipine, other medicines, foods, dyes, or preservatives -pregnant or trying to get pregnant -breast-feeding How should I use this medicine? Take this medicine by mouth with a glass of water. Follow the directions on the prescription label. Take your medicine at regular intervals. Do not take more medicine than directed. Talk to your pediatrician regarding the use of this medicine in children. Special care may be needed. This medicine has been used in children as young as 6. Persons over 55 years old may have a stronger reaction to this medicine and need smaller doses. Overdosage: If you think you have taken too much of this medicine contact a poison control center or emergency room at once. NOTE: This medicine is only for you. Do not share this medicine with others. What if I miss a dose? If you miss a dose, take it as soon as you can. If it is almost time for your next dose, take only that dose. Do not take double or extra doses. What may interact with this medicine? -herbal or dietary supplements -local or general anesthetics -medicines for high blood pressure -medicines for prostate problems -rifampin This list may not describe all possible  interactions. Give your health care provider a list of all the medicines, herbs, non-prescription drugs, or dietary supplements you use. Also tell them if you smoke, drink alcohol, or use illegal drugs. Some items may interact with your medicine. What should I watch for while using this medicine? Visit your doctor or health care professional for regular check ups. Check your blood pressure and pulse rate regularly. Ask your health care professional what your blood pressure and pulse rate should be, and when you should contact him or her. This medicine may make you feel confused, dizzy or lightheaded. Do not drive, use machinery, or do anything that needs mental alertness until you know how this medicine affects you. To reduce the risk of dizzy or fainting spells, do not sit or stand up quickly, especially if you are an older patient. Avoid alcoholic drinks; they can make you more dizzy. Do not suddenly stop taking amlodipine. Ask your doctor or health care professional how you can gradually reduce the dose. What side effects may I notice from receiving this medicine? Side effects that you should report to your doctor or health care professional as soon as possible: -allergic reactions like skin rash, itching or hives, swelling of the face, lips, or tongue -breathing problems -changes in vision or hearing -chest pain -fast, irregular heartbeat -swelling of legs or ankles Side effects that usually do not require medical attention (report to your doctor or health care professional if they continue or are bothersome): -dry mouth -facial flushing -nausea, vomiting -stomach gas, pain -tired, weak -trouble sleeping This list may not  describe all possible side effects. Call your doctor for medical advice about side effects. You may report side effects to FDA at 1-800-FDA-1088. Where should I keep my medicine? Keep out of the reach of children. Store at room temperature between 59 and 86 degrees F (15  and 30 degrees C). Protect from light. Keep container tightly closed. Throw away any unused medicine after the expiration date. NOTE: This sheet is a summary. It may not cover all possible information. If you have questions about this medicine, talk to your doctor, pharmacist, or health care provider.  2018 Elsevier/Gold Standard (2012-01-03 11:40:58)

## 2016-08-14 LAB — HM DIABETES EYE EXAM

## 2016-08-23 ENCOUNTER — Ambulatory Visit
Admission: RE | Admit: 2016-08-23 | Discharge: 2016-08-23 | Disposition: A | Discharge status: Home / Self Care | Payer: Medicare Other | Source: Ambulatory Visit | Attending: Physician Assistant | Admitting: Physician Assistant

## 2016-08-23 DIAGNOSIS — Z1231 Encounter for screening mammogram for malignant neoplasm of breast: Secondary | ICD-10-CM | POA: Insufficient documentation

## 2016-08-23 DIAGNOSIS — Z1239 Encounter for other screening for malignant neoplasm of breast: Secondary | ICD-10-CM

## 2016-08-26 ENCOUNTER — Telehealth: Payer: Self-pay

## 2016-08-26 NOTE — Telephone Encounter (Signed)
LMTCB  Thanks,  -Billye Nydam 

## 2016-08-26 NOTE — Telephone Encounter (Signed)
-----   Message from [name], [place] sent at 08/23/2016  6:04 PM EDT ----- Normal mammogram. Repeat screening in one year.

## 2016-08-28 ENCOUNTER — Encounter: Payer: Self-pay | Admitting: Physician Assistant

## 2016-08-28 ENCOUNTER — Ambulatory Visit (INDEPENDENT_AMBULATORY_CARE_PROVIDER_SITE_OTHER): Payer: Medicare Other | Admitting: Physician Assistant

## 2016-08-28 VITALS — BP 136/70 | HR 72 | Temp 98.3°F | Resp 16 | Wt 124.2 lb

## 2016-08-28 DIAGNOSIS — Z23 Encounter for immunization: Secondary | ICD-10-CM

## 2016-08-28 DIAGNOSIS — I10 Essential (primary) hypertension: Secondary | ICD-10-CM | POA: Diagnosis not present

## 2016-08-28 MED ORDER — AMLODIPINE BESYLATE 5 MG PO TABS: 5.0000 mg | ORAL_TABLET | Freq: Every day | ORAL | 1 refills | Status: DC

## 2016-08-28 NOTE — Progress Notes (Signed)
Patient: Gina Middleton Female    DOB: Feb 21, 1935   81 y.o.   MRN: [id number] Visit Date: 08/28/2016  Today's Provider: [name], PA-C   Chief Complaint  Patient presents with  . Hypertension   Subjective:    HPI  Follow up for hypertension  The patient was last seen for this 4 weeks ago. Changes made at last visit include D/C lisinopril and start amlodipine. Patient reports she is not checking her BP at home. Patient denies any chest pain, shortness of breath, or swelling around feet or ankles.  She reports excellent compliance with treatment. She feels that condition is Improved. She is not having side effects.  ------------------------------------------------------------------------------------  Patient reports that she has been seen by [facility] about two weeks ago. Patient reports that she was visual changes in her left eye. Patient reports a blood vessel is leaking into the retina. Patient reports she got an injection and will fu on 09/17/16.    Allergies  Allergen Reactions  . Hydrocodone-Acetaminophen Other (See Comments)    dizziness  . Iodinated Diagnostic Agents   . Oxycodone-Acetaminophen      Current Outpatient Prescriptions:  .  amLODipine (NORVASC) 5 MG tablet, Take 1 tablet (5 mg total) by mouth daily., Disp: 30 tablet, Rfl: 0 .  aspirin 81 MG tablet, Take by mouth., Disp: , Rfl:  .  CRANBERRY PO, Take by mouth., Disp: , Rfl:  .  Cyanocobalamin 1000 MCG TBCR, Take by mouth., Disp: , Rfl:  .  ibuprofen (ADVIL,MOTRIN) 200 MG tablet, Take 200 mg by mouth every 6 (six) hours as needed., Disp: , Rfl:  .  Omega-3 Fatty Acids (FISH OIL) 1000 MG CPDR, Take by mouth., Disp: , Rfl:   Review of Systems  Constitutional: Negative.   HENT: Negative.   Eyes: Positive for visual disturbance.  Respiratory: Negative.   Cardiovascular: Negative.     Social History  Substance Use Topics  . Smoking status: Never Smoker  . Smokeless  tobacco: Never Used  . Alcohol use No   Objective:   BP 136/70 (BP Location: Left Arm, Patient Position: Sitting, Cuff Size: Normal)   Pulse 72   Temp 98.3 F (36.8 C) (Oral)   Resp 16   Wt 124 lb 3.2 oz (56.3 kg)   BMI 22.00 kg/m  Vitals:   08/28/16 1001  BP: 136/70  Pulse: 72  Resp: 16  Temp: 98.3 F (36.8 C)  TempSrc: Oral  Weight: 124 lb 3.2 oz (56.3 kg)     Physical Exam  Constitutional: She appears well-developed and well-nourished. No distress.  Neck: Normal range of motion. Neck supple. No JVD present. No tracheal deviation present. No thyromegaly present.  Cardiovascular: Normal rate, regular rhythm and normal heart sounds.  Exam reveals no gallop and no friction rub.   No murmur heard. Pulmonary/Chest: Effort normal and breath sounds normal. No respiratory distress. She has no wheezes. She has no rales.  Musculoskeletal: She exhibits no edema.  Lymphadenopathy:    She has no cervical adenopathy.  Skin: She is not diaphoretic.  Vitals reviewed.     Assessment & Plan:     1. Essential hypertension Stable. Diagnosis pulled for medication refill. Continue current medical treatment plan. - amLODipine (NORVASC) 5 MG tablet; Take 1 tablet (5 mg total) by mouth daily.  Dispense: 90 tablet; Refill: 1  2. Need for pneumococcal vaccination Prevnar 13 Vaccine given to patient without complications. Patient sat for 15 minutes  after administration and was tolerated well without adverse effects. [facility] states patient had prevnar 13 on 12/20/2013 but this was not the case. Office note reviewed showed that she refused the vaccine that day, not given. No vaccine record was in [place]. Thus this was given today. She will be due for pneumococcal 23 next year. - Pneumococcal conjugate vaccine 13-valent       [name], PA-C  [facility]

## 2016-08-28 NOTE — Patient Instructions (Signed)
DASH Eating Plan DASH stands for "Dietary Approaches to Stop Hypertension." The DASH eating plan is a healthy eating plan that has been shown to reduce high blood pressure (hypertension). It may also reduce your risk for type 2 diabetes, heart disease, and stroke. The DASH eating plan may also help with weight loss. What are tips for following this plan? General guidelines  Avoid eating more than 2,300 mg (milligrams) of salt (sodium) a day. If you have hypertension, you may need to reduce your sodium intake to 1,500 mg a day.  Limit alcohol intake to no more than 1 drink a day for nonpregnant women and 2 drinks a day for men. One drink equals 12 oz of beer, 5 oz of wine, or 1 oz of hard liquor.  Work with your health care provider to maintain a healthy body weight or to lose weight. Ask what an ideal weight is for you.  Get at least 30 minutes of exercise that causes your heart to beat faster (aerobic exercise) most days of the week. Activities may include walking, swimming, or biking.  Work with your health care provider or diet and nutrition specialist (dietitian) to adjust your eating plan to your individual calorie needs. Reading food labels  Check food labels for the amount of sodium per serving. Choose foods with less than 5 percent of the Daily Value of sodium. Generally, foods with less than 300 mg of sodium per serving fit into this eating plan.  To find whole grains, look for the word "whole" as the first word in the ingredient list. Shopping  Buy products labeled as "low-sodium" or "no salt added."  Buy fresh foods. Avoid canned foods and premade or frozen meals. Cooking  Avoid adding salt when cooking. Use salt-free seasonings or herbs instead of table salt or sea salt. Check with your health care provider or pharmacist before using salt substitutes.  Do not fry foods. Cook foods using healthy methods such as baking, boiling, grilling, and broiling instead.  Cook with  heart-healthy oils, such as olive, canola, soybean, or sunflower oil. Meal planning   Eat a balanced diet that includes: ? 5 or more servings of fruits and vegetables each day. At each meal, try to fill half of your plate with fruits and vegetables. ? Up to 6-8 servings of whole grains each day. ? Less than 6 oz of lean meat, poultry, or fish each day. A 3-oz serving of meat is about the same size as a deck of cards. One egg equals 1 oz. ? 2 servings of low-fat dairy each day. ? A serving of nuts, seeds, or beans 5 times each week. ? Heart-healthy fats. Healthy fats called Omega-3 fatty acids are found in foods such as flaxseeds and coldwater fish, like sardines, salmon, and mackerel.  Limit how much you eat of the following: ? Canned or prepackaged foods. ? Food that is high in trans fat, such as fried foods. ? Food that is high in saturated fat, such as fatty meat. ? Sweets, desserts, sugary drinks, and other foods with added sugar. ? Full-fat dairy products.  Do not salt foods before eating.  Try to eat at least 2 vegetarian meals each week.  Eat more home-cooked food and less restaurant, buffet, and fast food.  When eating at a restaurant, ask that your food be prepared with less salt or no salt, if possible. What foods are recommended? The items listed may not be a complete list. Talk with your dietitian about what   dietary choices are best for you. Grains Whole-grain or whole-wheat bread. Whole-grain or whole-wheat pasta. Brown rice. Oatmeal. Quinoa. Bulgur. Whole-grain and low-sodium cereals. Pita bread. Low-fat, low-sodium crackers. Whole-wheat flour tortillas. Vegetables Fresh or frozen vegetables (raw, steamed, roasted, or grilled). Low-sodium or reduced-sodium tomato and vegetable juice. Low-sodium or reduced-sodium tomato sauce and tomato paste. Low-sodium or reduced-sodium canned vegetables. Fruits All fresh, dried, or frozen fruit. Canned fruit in natural juice (without  added sugar). Meat and other protein foods Skinless chicken or turkey. Ground chicken or turkey. Pork with fat trimmed off. Fish and seafood. Egg whites. Dried beans, peas, or lentils. Unsalted nuts, nut butters, and seeds. Unsalted canned beans. Lean cuts of beef with fat trimmed off. Low-sodium, lean deli meat. Dairy Low-fat (1%) or fat-free (skim) milk. Fat-free, low-fat, or reduced-fat cheeses. Nonfat, low-sodium ricotta or cottage cheese. Low-fat or nonfat yogurt. Low-fat, low-sodium cheese. Fats and oils Soft margarine without trans fats. Vegetable oil. Low-fat, reduced-fat, or light mayonnaise and salad dressings (reduced-sodium). Canola, safflower, olive, soybean, and sunflower oils. Avocado. Seasoning and other foods Herbs. Spices. Seasoning mixes without salt. Unsalted popcorn and pretzels. Fat-free sweets. What foods are not recommended? The items listed may not be a complete list. Talk with your dietitian about what dietary choices are best for you. Grains Baked goods made with fat, such as croissants, muffins, or some breads. Dry pasta or rice meal packs. Vegetables Creamed or fried vegetables. Vegetables in a cheese sauce. Regular canned vegetables (not low-sodium or reduced-sodium). Regular canned tomato sauce and paste (not low-sodium or reduced-sodium). Regular tomato and vegetable juice (not low-sodium or reduced-sodium). Pickles. Olives. Fruits Canned fruit in a light or heavy syrup. Fried fruit. Fruit in cream or butter sauce. Meat and other protein foods Fatty cuts of meat. Ribs. Fried meat. Bacon. Sausage. Bologna and other processed lunch meats. Salami. Fatback. Hotdogs. Bratwurst. Salted nuts and seeds. Canned beans with added salt. Canned or smoked fish. Whole eggs or egg yolks. Chicken or turkey with skin. Dairy Whole or 2% milk, cream, and half-and-half. Whole or full-fat cream cheese. Whole-fat or sweetened yogurt. Full-fat cheese. Nondairy creamers. Whipped toppings.  Processed cheese and cheese spreads. Fats and oils Butter. Stick margarine. Lard. Shortening. Ghee. Bacon fat. Tropical oils, such as coconut, palm kernel, or palm oil. Seasoning and other foods Salted popcorn and pretzels. Onion salt, garlic salt, seasoned salt, table salt, and sea salt. Worcestershire sauce. Tartar sauce. Barbecue sauce. Teriyaki sauce. Soy sauce, including reduced-sodium. Steak sauce. Canned and packaged gravies. Fish sauce. Oyster sauce. Cocktail sauce. Horseradish that you find on the shelf. Ketchup. Mustard. Meat flavorings and tenderizers. Bouillon cubes. Hot sauce and Tabasco sauce. Premade or packaged marinades. Premade or packaged taco seasonings. Relishes. Regular salad dressings. Where to find more information:  National Heart, Lung, and Blood Institute: www.nhlbi.nih.gov  American Heart Association: www.heart.org Summary  The DASH eating plan is a healthy eating plan that has been shown to reduce high blood pressure (hypertension). It may also reduce your risk for type 2 diabetes, heart disease, and stroke.  With the DASH eating plan, you should limit salt (sodium) intake to 2,300 mg a day. If you have hypertension, you may need to reduce your sodium intake to 1,500 mg a day.  When on the DASH eating plan, aim to eat more fresh fruits and vegetables, whole grains, lean proteins, low-fat dairy, and heart-healthy fats.  Work with your health care provider or diet and nutrition specialist (dietitian) to adjust your eating plan to your individual   calorie needs. This information is not intended to replace advice given to you by your health care provider. Make sure you discuss any questions you have with your health care provider. Document Released: 01/24/2011 Document Revised: 01/29/2016 Document Reviewed: 01/29/2016 Elsevier Interactive Patient Education  2017 [employer]vier Inc.  

## 2016-08-28 NOTE — Telephone Encounter (Signed)
Patient advised as below.  

## 2016-09-04 ENCOUNTER — Encounter: Payer: Self-pay | Admitting: Physician Assistant

## 2016-10-04 ENCOUNTER — Telehealth: Payer: Self-pay | Admitting: Physician Assistant

## 2016-10-04 NOTE — Telephone Encounter (Signed)
Left message regarding need to schedule awv prior to cpe 12//26/18

## 2016-10-18 ENCOUNTER — Encounter: Payer: Self-pay | Admitting: Physician Assistant

## 2016-12-31 ENCOUNTER — Telehealth: Payer: Self-pay

## 2016-12-31 NOTE — Telephone Encounter (Signed)
LMTCB and schedule AWV with [facility] prior to next physical in 12/18. -MM

## 2017-02-12 ENCOUNTER — Ambulatory Visit (INDEPENDENT_AMBULATORY_CARE_PROVIDER_SITE_OTHER): Payer: Medicare Other | Admitting: Physician Assistant

## 2017-02-12 ENCOUNTER — Encounter: Payer: Self-pay | Admitting: Physician Assistant

## 2017-02-12 VITALS — BP 158/70 | HR 80 | Temp 98.7°F | Resp 16 | Wt 127.6 lb

## 2017-02-12 DIAGNOSIS — T148XXA Other injury of unspecified body region, initial encounter: Secondary | ICD-10-CM | POA: Diagnosis not present

## 2017-02-12 DIAGNOSIS — E559 Vitamin D deficiency, unspecified: Secondary | ICD-10-CM | POA: Diagnosis not present

## 2017-02-12 DIAGNOSIS — Z1211 Encounter for screening for malignant neoplasm of colon: Secondary | ICD-10-CM | POA: Diagnosis not present

## 2017-02-12 DIAGNOSIS — R739 Hyperglycemia, unspecified: Secondary | ICD-10-CM

## 2017-02-12 DIAGNOSIS — I10 Essential (primary) hypertension: Secondary | ICD-10-CM | POA: Diagnosis not present

## 2017-02-12 DIAGNOSIS — Z0000 Encounter for general adult medical examination without abnormal findings: Secondary | ICD-10-CM | POA: Diagnosis not present

## 2017-02-12 MED ORDER — AMLODIPINE BESYLATE 5 MG PO TABS: 5.0000 mg | ORAL_TABLET | Freq: Every day | ORAL | 1 refills | Status: DC

## 2017-02-12 NOTE — Patient Instructions (Signed)
Health Maintenance for Postmenopausal Women Menopause is a normal process in which your reproductive ability comes to an end. This process happens gradually over a span of months to years, usually between the ages of 22 and 9. Menopause is complete when you have missed 12 consecutive menstrual periods. It is important to talk with your health care provider about some of the most common conditions that affect postmenopausal women, such as heart disease, cancer, and bone loss (osteoporosis). Adopting a healthy lifestyle and getting preventive care can help to promote your health and wellness. Those actions can also lower your chances of developing some of these common conditions. What should I know about menopause? During menopause, you may experience a number of symptoms, such as:  Moderate-to-severe hot flashes.  Night sweats.  Decrease in sex drive.  Mood swings.  Headaches.  Tiredness.  Irritability.  Memory problems.  Insomnia.  Choosing to treat or not to treat menopausal changes is an individual decision that you make with your health care provider. What should I know about hormone replacement therapy and supplements? Hormone therapy products are effective for treating symptoms that are associated with menopause, such as hot flashes and night sweats. Hormone replacement carries certain risks, especially as you become older. If you are thinking about using estrogen or estrogen with progestin treatments, discuss the benefits and risks with your health care provider. What should I know about heart disease and stroke? Heart disease, heart attack, and stroke become more likely as you age. This may be due, in part, to the hormonal changes that your body experiences during menopause. These can affect how your body processes dietary fats, triglycerides, and cholesterol. Heart attack and stroke are both medical emergencies. There are many things that you can do to help prevent heart disease  and stroke:  Have your blood pressure checked at least every 1-2 years. High blood pressure causes heart disease and increases the risk of stroke.  If you are 53-22 years old, ask your health care provider if you should take aspirin to prevent a heart attack or a stroke.  Do not use any tobacco products, including cigarettes, chewing tobacco, or electronic cigarettes. If you need help quitting, ask your health care provider.  It is important to eat a healthy diet and maintain a healthy weight. ? Be sure to include plenty of vegetables, fruits, low-fat dairy products, and lean protein. ? Avoid eating foods that are high in solid fats, added sugars, or salt (sodium).  Get regular exercise. This is one of the most important things that you can do for your health. ? Try to exercise for at least 150 minutes each week. The type of exercise that you do should increase your heart rate and make you sweat. This is known as moderate-intensity exercise. ? Try to do strengthening exercises at least twice each week. Do these in addition to the moderate-intensity exercise.  Know your numbers.Ask your health care provider to check your cholesterol and your blood glucose. Continue to have your blood tested as directed by your health care provider.  What should I know about cancer screening? There are several types of cancer. Take the following steps to reduce your risk and to catch any cancer development as early as possible. Breast Cancer  Practice breast self-awareness. ? This means understanding how your breasts normally appear and feel. ? It also means doing regular breast self-exams. Let your health care provider know about any changes, no matter how small.  If you are 40  or older, have a clinician do a breast exam (clinical breast exam or CBE) every year. Depending on your age, family history, and medical history, it may be recommended that you also have a yearly breast X-ray (mammogram).  If you  have a family history of breast cancer, talk with your health care provider about genetic screening.  If you are at high risk for breast cancer, talk with your health care provider about having an MRI and a mammogram every year.  Breast cancer (BRCA) gene test is recommended for women who have family members with BRCA-related cancers. Results of the assessment will determine the need for genetic counseling and BRCA1 and for BRCA2 testing. BRCA-related cancers include these types: ? Breast. This occurs in males or females. ? Ovarian. ? Tubal. This may also be called fallopian tube cancer. ? Cancer of the abdominal or pelvic lining (peritoneal cancer). ? Prostate. ? Pancreatic.  Cervical, Uterine, and Ovarian Cancer Your health care provider may recommend that you be screened regularly for cancer of the pelvic organs. These include your ovaries, uterus, and vagina. This screening involves a pelvic exam, which includes checking for microscopic changes to the surface of your cervix (Pap test).  For women ages 21-65, health care providers may recommend a pelvic exam and a Pap test every three years. For women ages 79-65, they may recommend the Pap test and pelvic exam, combined with testing for human papilloma virus (HPV), every five years. Some types of HPV increase your risk of cervical cancer. Testing for HPV may also be done on women of any age who have unclear Pap test results.  Other health care providers may not recommend any screening for nonpregnant women who are considered low risk for pelvic cancer and have no symptoms. Ask your health care provider if a screening pelvic exam is right for you.  If you have had past treatment for cervical cancer or a condition that could lead to cancer, you need Pap tests and screening for cancer for at least 20 years after your treatment. If Pap tests have been discontinued for you, your risk factors (such as having a new sexual partner) need to be  reassessed to determine if you should start having screenings again. Some women have medical problems that increase the chance of getting cervical cancer. In these cases, your health care provider may recommend that you have screening and Pap tests more often.  If you have a family history of uterine cancer or ovarian cancer, talk with your health care provider about genetic screening.  If you have vaginal bleeding after reaching menopause, tell your health care provider.  There are currently no reliable tests available to screen for ovarian cancer.  Lung Cancer Lung cancer screening is recommended for adults 69-62 years old who are at high risk for lung cancer because of a history of smoking. A yearly low-dose CT scan of the lungs is recommended if you:  Currently smoke.  Have a history of at least 30 pack-years of smoking and you currently smoke or have quit within the past 15 years. A pack-year is smoking an average of one pack of cigarettes per day for one year.  Yearly screening should:  Continue until it has been 15 years since you quit.  Stop if you develop a health problem that would prevent you from having lung cancer treatment.  Colorectal Cancer  This type of cancer can be detected and can often be prevented.  Routine colorectal cancer screening usually begins at  age 42 and continues through age 45.  If you have risk factors for colon cancer, your health care provider may recommend that you be screened at an earlier age.  If you have a family history of colorectal cancer, talk with your health care provider about genetic screening.  Your health care provider may also recommend using home test kits to check for hidden blood in your stool.  A small camera at the end of a tube can be used to examine your colon directly (sigmoidoscopy or colonoscopy). This is done to check for the earliest forms of colorectal cancer.  Direct examination of the colon should be repeated every  5-10 years until age 71. However, if early forms of precancerous polyps or small growths are found or if you have a family history or genetic risk for colorectal cancer, you may need to be screened more often.  Skin Cancer  Check your skin from head to toe regularly.  Monitor any moles. Be sure to tell your health care provider: ? About any new moles or changes in moles, especially if there is a change in a mole's shape or color. ? If you have a mole that is larger than the size of a pencil eraser.  If any of your family members has a history of skin cancer, especially at a young age, talk with your health care provider about genetic screening.  Always use sunscreen. Apply sunscreen liberally and repeatedly throughout the day.  Whenever you are outside, protect yourself by wearing long sleeves, pants, a wide-brimmed hat, and sunglasses.  What should I know about osteoporosis? Osteoporosis is a condition in which bone destruction happens more quickly than new bone creation. After menopause, you may be at an increased risk for osteoporosis. To help prevent osteoporosis or the bone fractures that can happen because of osteoporosis, the following is recommended:  If you are 46-71 years old, get at least 1,000 mg of calcium and at least 600 mg of vitamin D per day.  If you are older than age 55 but younger than age 65, get at least 1,200 mg of calcium and at least 600 mg of vitamin D per day.  If you are older than age 54, get at least 1,200 mg of calcium and at least 800 mg of vitamin D per day.  Smoking and excessive alcohol intake increase the risk of osteoporosis. Eat foods that are rich in calcium and vitamin D, and do weight-bearing exercises several times each week as directed by your health care provider. What should I know about how menopause affects my mental health? Depression may occur at any age, but it is more common as you become older. Common symptoms of depression  include:  Low or sad mood.  Changes in sleep patterns.  Changes in appetite or eating patterns.  Feeling an overall lack of motivation or enjoyment of activities that you previously enjoyed.  Frequent crying spells.  Talk with your health care provider if you think that you are experiencing depression. What should I know about immunizations? It is important that you get and maintain your immunizations. These include:  Tetanus, diphtheria, and pertussis (Tdap) booster vaccine.  Influenza every year before the flu season begins.  Pneumonia vaccine.  Shingles vaccine.  Your health care provider may also recommend other immunizations. This information is not intended to replace advice given to you by your health care provider. Make sure you discuss any questions you have with your health care provider. Document Released: 03/29/2005  Document Revised: 08/25/2015 Document Reviewed: 11/08/2014 Elsevier Interactive Patient Education  2018 [employer] Inc.  
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
## 2017-02-14 ENCOUNTER — Telehealth: Payer: Self-pay

## 2017-02-14 LAB — LIPID PANEL
Cholesterol: 162 mg/dL (ref <200)
HDL: 42 mg/dL — AB (ref >50)
LDL Cholesterol (Calc): 97 mg/dL (calc)
NON-HDL CHOLESTEROL (CALC): 120 mg/dL (ref <130)
Total CHOL/HDL Ratio: 3.9 (calc) (ref <5.0)
Triglycerides: 133 mg/dL (ref <150)

## 2017-02-14 LAB — COMPLETE METABOLIC PANEL WITH GFR
AG Ratio: 1.4 (calc) (ref 1.0–2.5)
ALKALINE PHOSPHATASE (APISO): 110 U/L (ref 33–130)
ALT: 10 U/L (ref 6–29)
AST: 17 U/L (ref 10–35)
Albumin: 4 g/dL (ref 3.6–5.1)
BILIRUBIN TOTAL: 1 mg/dL (ref 0.2–1.2)
BUN: 15 mg/dL (ref 7–25)
CHLORIDE: 107 mmol/L (ref 98–110)
CO2: 30 mmol/L (ref 20–32)
CREATININE: 0.81 mg/dL (ref 0.60–0.93)
Calcium: 9.2 mg/dL (ref 8.6–10.4)
GFR, Est African American: 80 mL/min/{1.73_m2} (ref >=60)
GFR, Est Non African American: 69 mL/min/{1.73_m2} (ref >=60)
GLUCOSE: 101 mg/dL — AB (ref 65–99)
Globulin: 2.8 g/dL (calc) (ref 1.9–3.7)
Potassium: 4.3 mmol/L (ref 3.5–5.3)
Sodium: 142 mmol/L (ref 135–146)
Total Protein: 6.8 g/dL (ref 6.1–8.1)

## 2017-02-14 LAB — CBC WITH DIFFERENTIAL/PLATELET
Basophils Absolute: 40 cells/uL (ref 0–200)
Basophils Relative: 0.7 %
EOS ABS: 137 {cells}/uL (ref 15–500)
Eosinophils Relative: 2.4 %
HEMATOCRIT: 37.1 % (ref 35.0–45.0)
Hemoglobin: 12.5 g/dL (ref 11.7–15.5)
LYMPHS ABS: 1283 {cells}/uL (ref 850–3900)
MCH: 30.3 pg (ref 27.0–33.0)
MCHC: 33.7 g/dL (ref 32.0–36.0)
MCV: 89.8 fL (ref 80.0–100.0)
MPV: 10.5 fL (ref 7.5–12.5)
Monocytes Relative: 8.2 %
NEUTROS PCT: 66.2 %
Neutro Abs: 3773 cells/uL (ref 1500–7800)
Platelets: 237 10*3/uL (ref 140–400)
RBC: 4.13 10*6/uL (ref 3.80–5.10)
RDW: 12 % (ref 11.0–15.0)
Total Lymphocyte: 22.5 %
WBC: 5.7 10*3/uL (ref 3.8–10.8)
WBCMIX: 467 {cells}/uL (ref 200–950)

## 2017-02-14 LAB — HEMOGLOBIN A1C
EAG (MMOL/L): 6.3 (calc)
Hgb A1c MFr Bld: 5.6 % of total Hgb (ref <5.7)
Mean Plasma Glucose: 114 (calc)

## 2017-02-14 LAB — TSH: TSH: 1.41 m[IU]/L (ref 0.40–4.50)

## 2017-02-14 LAB — VITAMIN D 25 HYDROXY (VIT D DEFICIENCY, FRACTURES): VIT D 25 HYDROXY: 25 ng/mL — AB (ref 30–100)

## 2017-02-14 NOTE — Telephone Encounter (Signed)
Patient advised as directed below. Patient is going to start taking the Vitamin D OTC.  Thanks,  -Joseline

## 2017-02-14 NOTE — Telephone Encounter (Signed)
-----   Message from [name], [place] sent at 02/14/2017  8:22 AM EST ----- All labs are stable with exception of Vit D which has decreased from 29 to 25. Would recommend starting Vit D OTC 1000-2000 IU daily if not already taking. If you are taking an OTC Vit D supplement let us know and I will send in a Rx dose for you.

## 2017-03-21 ENCOUNTER — Encounter: Payer: Self-pay | Admitting: Physician Assistant

## 2017-03-21 ENCOUNTER — Ambulatory Visit (INDEPENDENT_AMBULATORY_CARE_PROVIDER_SITE_OTHER): Payer: Medicare Other | Admitting: Physician Assistant

## 2017-03-21 VITALS — BP 170/80 | HR 87 | Temp 99.6°F | Resp 16 | Wt 126.4 lb

## 2017-03-21 DIAGNOSIS — J069 Acute upper respiratory infection, unspecified: Secondary | ICD-10-CM | POA: Diagnosis not present

## 2017-03-21 DIAGNOSIS — J189 Pneumonia, unspecified organism: Secondary | ICD-10-CM

## 2017-03-21 HISTORY — DX: Pneumonia, unspecified organism: J18.9

## 2017-03-21 MED ORDER — AMOXICILLIN-POT CLAVULANATE 875-125 MG PO TABS: 1.0000 | ORAL_TABLET | Freq: Two times a day (BID) | ORAL | 0 refills | Status: DC

## 2017-03-21 NOTE — Progress Notes (Signed)
Patient: Gina Middleton Female    DOB: 09-16-1935   82 y.o.   MRN: [id number] Visit Date: 03/21/2017  Today's Provider: [name], PA-C   Chief Complaint  Patient presents with  . Sore Throat   Subjective:    Sore Throat   This is a new problem. The current episode started yesterday. The problem has been gradually worsening. There has been no fever. Associated symptoms include coughing and a plugged ear sensation. Pertinent negatives include no ear discharge, ear pain, headaches, shortness of breath, trouble swallowing or vomiting. She has tried gargles and NSAIDs (IBU at 11 am today) for the symptoms. The treatment provided no relief.      Allergies  Allergen Reactions  . Other Anaphylaxis  . Hydrocodone-Acetaminophen Other (See Comments)    dizziness  . Iodinated Diagnostic Agents   . Oxycodone-Acetaminophen      Current Outpatient Medications:  .  amLODipine (NORVASC) 5 MG tablet, Take 1 tablet (5 mg total) by mouth daily., Disp: 90 tablet, Rfl: 1 .  aspirin 81 MG tablet, Take by mouth., Disp: , Rfl:  .  CRANBERRY PO, Take by mouth., Disp: , Rfl:  .  Cyanocobalamin 1000 MCG TBCR, Take by mouth., Disp: , Rfl:  .  ibuprofen (ADVIL,MOTRIN) 200 MG tablet, Take 200 mg by mouth every 6 (six) hours as needed., Disp: , Rfl:  .  Omega-3 Fatty Acids (FISH OIL) 1000 MG CPDR, Take by mouth., Disp: , Rfl:   Review of Systems  Constitutional: Positive for appetite change and chills. Negative for fatigue and fever.  HENT: Positive for postnasal drip, sore throat and voice change. Negative for ear discharge, ear pain, sinus pressure, sinus pain, sneezing and trouble swallowing.   Respiratory: Positive for cough. Negative for chest tightness, shortness of breath and wheezing.   Cardiovascular: Negative for chest pain, palpitations and leg swelling.  Gastrointestinal: Negative.  Negative for vomiting.  Neurological: Negative for headaches.    Social History    Tobacco Use  . Smoking status: Never Smoker  . Smokeless tobacco: Never Used  Substance Use Topics  . Alcohol use: No   Objective:   BP (!) 170/80 (BP Location: Left Arm, Patient Position: Sitting, Cuff Size: Normal)   Pulse 87   Temp 99.6 F (37.6 C) (Oral)   Resp 16   Wt 126 lb 6.4 oz (57.3 kg)   SpO2 97%   BMI 22.39 kg/m    Physical Exam  Constitutional: She appears well-developed and well-nourished. No distress.  HENT:  Head: Normocephalic and atraumatic.  Right Ear: Hearing, tympanic membrane, external ear and ear canal normal.  Left Ear: Hearing, tympanic membrane, external ear and ear canal normal.  Nose: Nose normal.  Mouth/Throat: Uvula is midline, oropharynx is clear and moist and mucous membranes are normal. No oropharyngeal exudate.  Eyes: Conjunctivae are normal. Pupils are equal, round, and reactive to light. Right eye exhibits no discharge. Left eye exhibits no discharge. No scleral icterus.  Neck: Normal range of motion. Neck supple. No tracheal deviation present. No thyromegaly present.  Cardiovascular: Normal rate, regular rhythm and normal heart sounds. Exam reveals no gallop and no friction rub.  No murmur heard. Pulmonary/Chest: Effort normal and breath sounds normal. No stridor. No respiratory distress. She has no wheezes. She has no rales.  Lymphadenopathy:    She has no cervical adenopathy.  Skin: Skin is warm and dry. She is not diaphoretic.  Vitals reviewed.  Assessment & Plan:     1. Upper respiratory tract infection, unspecified type Worsening symptoms that have not responded to OTC medications. Will give augmentin as below. Continue allergy medications. Stay well hydrated and get plenty of rest. Call if no symptom improvement or if symptoms worsen. - amoxicillin-clavulanate (AUGMENTIN) 875-125 MG tablet; Take 1 tablet by mouth 2 (two) times daily.  Dispense: 20 tablet; Refill: 0       [name], PA-C  [facility]

## 2017-03-24 ENCOUNTER — Telehealth: Payer: Self-pay | Admitting: Physician Assistant

## 2017-03-24 DIAGNOSIS — J40 Bronchitis, not specified as acute or chronic: Secondary | ICD-10-CM

## 2017-03-24 MED ORDER — BENZONATATE 200 MG PO CAPS: 200.0000 mg | ORAL_CAPSULE | Freq: Three times a day (TID) | ORAL | 0 refills | Status: DC | PRN

## 2017-03-24 MED ORDER — PREDNISONE 10 MG (21) PO TBPK: ORAL_TABLET | ORAL | 0 refills | Status: DC

## 2017-03-24 NOTE — Telephone Encounter (Signed)
She has only been on antibiotic for 3 days. Continue antibiotic. I will send in prednisone for cough as well as some medication for the cough to hopefully lessen.

## 2017-03-24 NOTE — Telephone Encounter (Signed)
Advised patient as below.  

## 2017-03-24 NOTE — Telephone Encounter (Signed)
Patient was seen on Friday at 1:30 for sore throat and fever.   Patient is calling back to say she is not any better. She is constantly coughing and throwing up yellow  mucous

## 2017-03-27 ENCOUNTER — Telehealth: Payer: Self-pay

## 2017-03-27 DIAGNOSIS — H6983 Other specified disorders of Eustachian tube, bilateral: Secondary | ICD-10-CM

## 2017-03-27 MED ORDER — FLUTICASONE PROPIONATE 50 MCG/ACT NA SUSP: 2.0000 | Freq: Every day | NASAL | 6 refills | Status: DC

## 2017-03-27 NOTE — Telephone Encounter (Signed)
Flonase is best. This will be sent in.

## 2017-03-27 NOTE — Telephone Encounter (Signed)
Pt's husband advised as directed below.  Thanks,  -Joseline

## 2017-03-27 NOTE — Telephone Encounter (Signed)
Patient called saying that she now has fluid in her ears. She reports that her sore throat is better, but has a clogged ear sensation. Patient wants to know what can she do to resolve this? Please advise. Patient uses [facility] on [address] Thanks!

## 2017-03-28 ENCOUNTER — Telehealth: Payer: Self-pay

## 2017-03-28 ENCOUNTER — Encounter: Payer: Self-pay | Admitting: Physician Assistant

## 2017-03-28 ENCOUNTER — Ambulatory Visit
Admission: RE | Admit: 2017-03-28 | Discharge: 2017-03-28 | Disposition: A | Discharge status: Home / Self Care | Payer: Medicare Other | Source: Ambulatory Visit | Attending: Physician Assistant | Admitting: Physician Assistant

## 2017-03-28 ENCOUNTER — Ambulatory Visit (INDEPENDENT_AMBULATORY_CARE_PROVIDER_SITE_OTHER): Payer: Medicare Other | Admitting: Physician Assistant

## 2017-03-28 VITALS — BP 176/82 | HR 102 | Temp 101.9°F | Wt 122.0 lb

## 2017-03-28 DIAGNOSIS — R05 Cough: Secondary | ICD-10-CM | POA: Insufficient documentation

## 2017-03-28 DIAGNOSIS — R509 Fever, unspecified: Secondary | ICD-10-CM

## 2017-03-28 DIAGNOSIS — J189 Pneumonia, unspecified organism: Secondary | ICD-10-CM

## 2017-03-28 DIAGNOSIS — R059 Cough, unspecified: Secondary | ICD-10-CM

## 2017-03-28 DIAGNOSIS — R918 Other nonspecific abnormal finding of lung field: Secondary | ICD-10-CM | POA: Insufficient documentation

## 2017-03-28 MED ORDER — LEVOFLOXACIN 750 MG PO TABS: 750.0000 mg | ORAL_TABLET | Freq: Every day | ORAL | 0 refills | Status: AC

## 2017-03-28 NOTE — Telephone Encounter (Signed)
-----   Message from Trinna Post, [place] sent at 03/28/2017 12:29 PM EST ----- CXR shows evidence of pneumonia. I would like her to stop her Augmentin and I am going to send her Levaquin 750 mg QD x 5 days into her pharmacy. She can start this today. Can we please schedule her in our Saturday clinic for follow up?

## 2017-03-28 NOTE — Patient Instructions (Signed)

## 2017-03-28 NOTE — Telephone Encounter (Signed)
Left message at home and cell phone numbers.     Thanks,   -Mickel Baas

## 2017-03-28 NOTE — Progress Notes (Signed)
Patient: Gina Middleton Female    DOB: 04-19-1935   82 y.o.   MRN: [id number] Visit Date: 03/28/2017  Today's Provider: Trinna Post, PA-C   Chief Complaint  Patient presents with  . URI   Subjective:    Gina Middleton is a 82 y/o woman presenting today for worsening cough and fever. She was seen in this clinic on 03/21/2017 and treated for URI with Augmentin 875/125 BID x 10 days. She has been feeling progressively worse since then. She reports fever, productive cough. She denies SOB. She is using an albuterol inhaler and has been on prednisone taper. She also endorses weakness and malaise.  URI   This is a new problem. The current episode started 1 to 4 weeks ago. The problem has been gradually worsening. Associated symptoms include congestion, coughing, headaches, sinus pain and a sore throat. Pertinent negatives include no ear pain, nausea, neck pain, plugged ear sensation, rhinorrhea, sneezing or wheezing.       Allergies  Allergen Reactions  . Other Anaphylaxis    A muscle relaxer in the 70s but cannot remember name of the medication that caused it  . Hydrocodone-Acetaminophen Other (See Comments)    dizziness  . Iodinated Diagnostic Agents   . Oxycodone-Acetaminophen      Current Outpatient Medications:  .  amLODipine (NORVASC) 5 MG tablet, Take 1 tablet (5 mg total) by mouth daily., Disp: 90 tablet, Rfl: 1 .  amoxicillin-clavulanate (AUGMENTIN) 875-125 MG tablet, Take 1 tablet by mouth 2 (two) times daily., Disp: 20 tablet, Rfl: 0 .  aspirin 81 MG tablet, Take by mouth., Disp: , Rfl:  .  benzonatate (TESSALON) 200 MG capsule, Take 1 capsule (200 mg total) by mouth 3 (three) times daily as needed for cough., Disp: 30 capsule, Rfl: 0 .  CRANBERRY PO, Take by mouth., Disp: , Rfl:  .  Cyanocobalamin 1000 MCG TBCR, Take by mouth., Disp: , Rfl:  .  fluticasone (FLONASE) 50 MCG/ACT nasal spray, Place 2 sprays into both nostrils daily., Disp: 16 g, Rfl: 6 .   ibuprofen (ADVIL,MOTRIN) 200 MG tablet, Take 200 mg by mouth every 6 (six) hours as needed., Disp: , Rfl:  .  Omega-3 Fatty Acids (FISH OIL) 1000 MG CPDR, Take by mouth., Disp: , Rfl:  .  predniSONE (STERAPRED UNI-PAK 21 TAB) 10 MG (21) TBPK tablet, 6 day taper; take as directed on package instructions, Disp: 21 tablet, Rfl: 0 .  levofloxacin (LEVAQUIN) 750 MG tablet, Take 1 tablet (750 mg total) by mouth daily for 5 days., Disp: 5 tablet, Rfl: 0  Review of Systems  Constitutional: Positive for fatigue and fever. Negative for activity change, appetite change, chills, diaphoresis and unexpected weight change.  HENT: Positive for congestion, mouth sores, sinus pressure, sinus pain, sore throat and tinnitus. Negative for ear discharge, ear pain, nosebleeds, postnasal drip, rhinorrhea and sneezing.   Eyes: Negative.   Respiratory: Positive for cough. Negative for apnea, choking, chest tightness, shortness of breath, wheezing and stridor.   Gastrointestinal: Negative.  Negative for nausea.  Musculoskeletal: Negative for neck pain.  Neurological: Positive for weakness, light-headedness and headaches. Negative for dizziness.    Social History   Tobacco Use  . Smoking status: Never Smoker  . Smokeless tobacco: Never Used  Substance Use Topics  . Alcohol use: No   Objective:   BP (!) 176/82 (BP Location: Right Arm, Patient Position: Sitting, Cuff Size: Normal)   Pulse (!) 102   Temp (!) 101.9  F (38.8 C) (Oral)   Wt 122 lb (55.3 kg)   SpO2 92%   BMI 21.61 kg/m  Vitals:   03/28/17 1129  BP: (!) 176/82  Pulse: (!) 102  Temp: (!) 101.9 F (38.8 C)  TempSrc: Oral  SpO2: 92%  Weight: 122 lb (55.3 kg)     Physical Exam  Constitutional: She is oriented to person, place, and time. She appears well-developed and well-nourished. She appears ill.  HENT:  Right Ear: External ear normal.  Left Ear: External ear normal.  Mouth/Throat: Oropharynx is clear and moist. No oropharyngeal exudate.   Cardiovascular: Regular rhythm and normal heart sounds. Tachycardia present.  Pulmonary/Chest: Effort normal. She has wheezes.  Wheezes diffusely but most concentrated in the RUL. Rhonchi in the RUL.   Lymphadenopathy:    She has no cervical adenopathy.  Neurological: She is alert and oriented to person, place, and time.  Skin: Skin is warm and dry.  Psychiatric: She has a normal mood and affect. Her behavior is normal.        Assessment & Plan:     1. Cough with fever  Patient is febrile today in office, ox is 92% and she is tachycardic. Adverse lung sounds concentrated, concerned for pneumonia. Possible she could have the flu but this is more of a progressive worsening rather than sudden onset and symptoms have been present > 1 week.  CXR shows consolidation in left lower lung and possibly right lung base as well as small left pleural effusion. Will change Augmentin to Levaquin. I have personally reviewed this image and agree with the findings.  - DG Chest 2 View; Future  Return in about 1 day (around 03/29/2017) for cough, fever .  The entirety of the information documented in the History of Present Illness, Review of Systems and Physical Exam were personally obtained by me. Portions of this information were initially documented by Gina Middleton, CMA and reviewed by me for thoroughness and accuracy.        Trinna Post, PA-C  [facility]

## 2017-03-29 ENCOUNTER — Encounter: Payer: Self-pay | Admitting: Physician Assistant

## 2017-03-29 ENCOUNTER — Ambulatory Visit (INDEPENDENT_AMBULATORY_CARE_PROVIDER_SITE_OTHER): Payer: Medicare Other | Admitting: Physician Assistant

## 2017-03-29 VITALS — BP 146/74 | HR 92 | Resp 16 | Wt 122.0 lb

## 2017-03-29 DIAGNOSIS — J189 Pneumonia, unspecified organism: Secondary | ICD-10-CM

## 2017-03-29 NOTE — Progress Notes (Signed)
Patient: Gina Middleton Female    DOB: 1935/07/15   82 y.o.   MRN: [id number] Visit Date: 03/29/2017  Today's Provider: Trinna Post, PA-C   Chief Complaint  Patient presents with  . Pneumonia   Subjective:    Gina Middleton is a 82 y/o woman presenting today for follow up of pneumonia. She presented to this clinic on 03/21/2017 with URI and was treated with Augmentin 875/125 BID x 10 days. On 03/28/2017 she represented with fever, worsening SOB, and cough. Her pulse ox was 92% and fever was 1059F. CXR revealed left lower lobe opacities concerning for pneumonia, also some right lung opacities possibly representing atelectasis or infection.   She was changed from Augmentin to Levofloxacin 750 mg QD and instructed to follow up in clinic the next day.   She is in clinic today. Her Temperature is 99.59F. She reports she took one dose of Levofloxacin 750 mg yesterday at 3:30 PM. She reports that this made her feel loopy for an hour or so. She denies rash or swelling, no trouble breathing when taking this medication.   Pneumonia  She complains of cough and wheezing. There is no shortness of breath. This is a new problem. The current episode started in the past 7 days. The problem has been unchanged. The cough is productive. Associated symptoms include a fever, headaches, malaise/fatigue, myalgias and sweats. Pertinent negatives include no appetite change, dyspnea on exertion, ear congestion, ear pain, nasal congestion, postnasal drip, rhinorrhea, sneezing or sore throat.       Allergies  Allergen Reactions  . Other Anaphylaxis    A muscle relaxer in the 70s but cannot remember name of the medication that caused it  . Hydrocodone-Acetaminophen Other (See Comments)    dizziness  . Iodinated Diagnostic Agents   . Oxycodone-Acetaminophen      Current Outpatient Medications:  .  amLODipine (NORVASC) 5 MG tablet, Take 1 tablet (5 mg total) by mouth daily., Disp: 90 tablet,  Rfl: 1 .  aspirin 81 MG tablet, Take by mouth., Disp: , Rfl:  .  benzonatate (TESSALON) 200 MG capsule, Take 1 capsule (200 mg total) by mouth 3 (three) times daily as needed for cough., Disp: 30 capsule, Rfl: 0 .  CRANBERRY PO, Take by mouth., Disp: , Rfl:  .  Cyanocobalamin 1000 MCG TBCR, Take by mouth., Disp: , Rfl:  .  fluticasone (FLONASE) 50 MCG/ACT nasal spray, Place 2 sprays into both nostrils daily., Disp: 16 g, Rfl: 6 .  ibuprofen (ADVIL,MOTRIN) 200 MG tablet, Take 200 mg by mouth every 6 (six) hours as needed., Disp: , Rfl:  .  levofloxacin (LEVAQUIN) 750 MG tablet, Take 1 tablet (750 mg total) by mouth daily for 5 days., Disp: 5 tablet, Rfl: 0 .  Omega-3 Fatty Acids (FISH OIL) 1000 MG CPDR, Take by mouth., Disp: , Rfl:  .  predniSONE (STERAPRED UNI-PAK 21 TAB) 10 MG (21) TBPK tablet, 6 day taper; take as directed on package instructions, Disp: 21 tablet, Rfl: 0  Review of Systems  Constitutional: Positive for chills, diaphoresis, fatigue, fever and malaise/fatigue. Negative for activity change, appetite change and unexpected weight change.  HENT: Positive for congestion. Negative for ear discharge, ear pain, nosebleeds, postnasal drip, rhinorrhea, sinus pressure, sinus pain, sneezing and sore throat.   Eyes: Negative.   Respiratory: Positive for cough and wheezing. Negative for apnea, choking, chest tightness, shortness of breath and stridor.   Cardiovascular: Negative for dyspnea on  exertion.  Gastrointestinal: Negative.   Musculoskeletal: Positive for myalgias.  Neurological: Positive for dizziness, weakness, light-headedness and headaches.  Psychiatric/Behavioral: Positive for confusion (Pt's husband reports some confusion last night. ).    Social History   Tobacco Use  . Smoking status: Never Smoker  . Smokeless tobacco: Never Used  Substance Use Topics  . Alcohol use: No   Objective:   BP (!) 146/74 (BP Location: Right Arm, Patient Position: Sitting, Cuff Size:  Normal)   Pulse 92   Resp 16   Wt 122 lb (55.3 kg)   SpO2 93%   BMI 21.61 kg/m  Vitals:   03/29/17 1006  BP: (!) 146/74  Pulse: 92  Resp: 16  SpO2: 93%  Weight: 122 lb (55.3 kg)     Physical Exam  Constitutional: She is oriented to person, place, and time. She appears well-developed and well-nourished.  Cardiovascular: Normal rate and regular rhythm.  Pulmonary/Chest: Effort normal. She has wheezes.  Rhonchi and wheezes throughout bilateral lung fields.   Neurological: She is alert and oriented to person, place, and time.  Skin: Skin is warm and dry.  Psychiatric: She has a normal mood and affect. Her behavior is normal.        Assessment & Plan:     1. Pneumonia of both lungs due to infectious organism, unspecified part of lung  Do not expect significant improvement in 24 hours, however clinically she looks slightly better than yesterday. Pulse Ox is 93% which is low but stable. She is not in respiratory distress today. She should continue to take levofloxacin for four more days. If she has a return or increased fever, prolonged vomiting, confusion, weakness, she should proceed to the hospital. Follow up in clinic on Monday. Counseled she will need CXR in one month to ensure pneumonia has resolved. She declines albuterol inhaler today.  Return in about 2 days (around 03/31/2017) for pneumonia.  The entirety of the information documented in the History of Present Illness, Review of Systems and Physical Exam were personally obtained by me. Portions of this information were initially documented by Ashley Royalty, CMA and reviewed by me for thoroughness and accuracy.           Trinna Post, PA-C  [facility]

## 2017-03-29 NOTE — Patient Instructions (Signed)

## 2017-03-31 ENCOUNTER — Encounter: Payer: Self-pay | Admitting: Physician Assistant

## 2017-03-31 ENCOUNTER — Ambulatory Visit (INDEPENDENT_AMBULATORY_CARE_PROVIDER_SITE_OTHER): Payer: Medicare Other | Admitting: Physician Assistant

## 2017-03-31 VITALS — BP 120/82 | HR 88 | Temp 98.3°F | Resp 20

## 2017-03-31 DIAGNOSIS — J189 Pneumonia, unspecified organism: Secondary | ICD-10-CM

## 2017-03-31 NOTE — Patient Instructions (Signed)

## 2017-03-31 NOTE — Progress Notes (Signed)
Patient: Gina Middleton Female    DOB: [birth year]   82 y.o.   MRN: [id number] Visit Date: 03/31/2017  Today's Provider: Trinna Post, PA-C   Chief Complaint  Patient presents with  . Pneumonia   Subjective:    Gina Middleton is a 82 y/o woman presenting today for follow up for pneumonia. She initially presented on 2/8 with worsening fever and SOB after course of Augmentin. CXR revealed pneumonia and she was started on Levaquin 750 mg QD x 5 days. She was seen by myself at our Saturday clinic on 03/29/2017 with mild improvement and decrease in fever.   She presents today after three doses of Levaquin and moderate improvement. She denies fever, nausea, vomiting, confusion. She reports an intermittently productive cough. She feels fatigued.   Pneumonia  She complains of cough and sputum production. There is no chest tightness, shortness of breath or wheezing. This is a new problem. The current episode started 1 to 4 weeks ago. The problem has been gradually improving. Pertinent negatives include no appetite change, fever or headaches.       Allergies  Allergen Reactions  . Other Anaphylaxis    A muscle relaxer in the 70s but cannot remember name of the medication that caused it  . Hydrocodone-Acetaminophen Other (See Comments)    dizziness  . Iodinated Diagnostic Agents   . Oxycodone-Acetaminophen      Current Outpatient Medications:  .  amLODipine (NORVASC) 5 MG tablet, Take 1 tablet (5 mg total) by mouth daily., Disp: 90 tablet, Rfl: 1 .  aspirin 81 MG tablet, Take by mouth., Disp: , Rfl:  .  benzonatate (TESSALON) 200 MG capsule, Take 1 capsule (200 mg total) by mouth 3 (three) times daily as needed for cough., Disp: 30 capsule, Rfl: 0 .  CRANBERRY PO, Take by mouth., Disp: , Rfl:  .  Cyanocobalamin 1000 MCG TBCR, Take by mouth., Disp: , Rfl:  .  fluticasone (FLONASE) 50 MCG/ACT nasal spray, Place 2 sprays into both nostrils daily., Disp: 16 g, Rfl: 6 .   ibuprofen (ADVIL,MOTRIN) 200 MG tablet, Take 200 mg by mouth every 6 (six) hours as needed., Disp: , Rfl:  .  levofloxacin (LEVAQUIN) 750 MG tablet, Take 1 tablet (750 mg total) by mouth daily for 5 days., Disp: 5 tablet, Rfl: 0 .  Omega-3 Fatty Acids (FISH OIL) 1000 MG CPDR, Take by mouth., Disp: , Rfl:  .  predniSONE (STERAPRED UNI-PAK 21 TAB) 10 MG (21) TBPK tablet, 6 day taper; take as directed on package instructions, Disp: 21 tablet, Rfl: 0  Review of Systems  Constitutional: Positive for fatigue. Negative for activity change, appetite change, chills, diaphoresis, fever and unexpected weight change.  HENT: Negative.   Respiratory: Positive for cough and sputum production. Negative for apnea, choking, chest tightness, shortness of breath, wheezing and stridor.   Gastrointestinal: Negative.   Neurological: Positive for light-headedness. Negative for dizziness and headaches.    Social History   Tobacco Use  . Smoking status: Never Smoker  . Smokeless tobacco: Never Used  Substance Use Topics  . Alcohol use: No   Objective:   BP 120/82 (BP Location: Left Arm, Patient Position: Sitting, Cuff Size: Normal)   Pulse 88   Temp 98.3 F (36.8 C) (Oral)   Resp 20   SpO2 96%  Vitals:   03/31/17 1330  BP: 120/82  Pulse: 88  Resp: 20  Temp: 98.3 F (36.8 C)  TempSrc: Oral  SpO2: 96%     Physical Exam  Constitutional: She is oriented to person, place, and time. She appears well-developed and well-nourished.  Cardiovascular: Normal rate and regular rhythm.  Pulmonary/Chest: Effort normal. She has wheezes.  Rhonchi and wheezes diffusely.   Neurological: She is alert and oriented to person, place, and time.  Skin: Skin is warm and dry.  Psychiatric: She has a normal mood and affect. Her behavior is normal.        Assessment & Plan:     1. Pneumonia due to infectious organism, unspecified laterality, unspecified part of lung  Improved, two doses of Levaquin left. Her pulse ox  has improved. She is afebrile. Will need CXR in ~ 1 month to check resolution of pneumonia. Follow up one week.  - DG Chest 2 View; Future  Return in about 1 week (around 04/07/2017) for pneumonia.  The entirety of the information documented in the History of Present Illness, Review of Systems and Physical Exam were personally obtained by me. Portions of this information were initially documented by Ashley Royalty, CMA and reviewed by me for thoroughness and accuracy.         Trinna Post, PA-C  [facility]

## 2017-04-07 ENCOUNTER — Encounter: Payer: Self-pay | Admitting: Physician Assistant

## 2017-04-07 ENCOUNTER — Ambulatory Visit (INDEPENDENT_AMBULATORY_CARE_PROVIDER_SITE_OTHER): Payer: Medicare Other | Admitting: Physician Assistant

## 2017-04-07 VITALS — BP 142/84 | HR 88 | Temp 99.3°F | Resp 16 | Wt 123.0 lb

## 2017-04-07 DIAGNOSIS — J189 Pneumonia, unspecified organism: Secondary | ICD-10-CM

## 2017-04-07 DIAGNOSIS — J181 Lobar pneumonia, unspecified organism: Secondary | ICD-10-CM

## 2017-04-07 NOTE — Progress Notes (Signed)
Patient: Gina Middleton Female    DOB: [birth year]   82 y.o.   MRN: [id number] Visit Date: 04/07/2017  Today's Provider: Trinna Post, PA-C   Chief Complaint  Patient presents with  . Pneumonia    One week follow up   Subjective:    Gina Middleton is a 82 y/o woman presenting today for pneumonia follow up. She has completed course of Levaquin 750 mg QD x 5 days. She feels 50% better. Denies fever. She does continue to endorse an intermittently productive cough and fatigue.   Pneumonia  She complains of cough. There is no shortness of breath or wheezing. This is a new problem. The problem has been gradually improving (Pt reports about a 50% improvement.). The cough is productive. Associated symptoms include myalgias. Pertinent negatives include no appetite change, ear pain, fever, headaches, postnasal drip, rhinorrhea or sore throat.       Allergies  Allergen Reactions  . Other Anaphylaxis    A muscle relaxer in the 70s but cannot remember name of the medication that caused it  . Hydrocodone-Acetaminophen Other (See Comments)    dizziness  . Iodinated Diagnostic Agents   . Oxycodone-Acetaminophen      Current Outpatient Medications:  .  amLODipine (NORVASC) 5 MG tablet, Take 1 tablet (5 mg total) by mouth daily., Disp: 90 tablet, Rfl: 1 .  aspirin 81 MG tablet, Take by mouth., Disp: , Rfl:  .  CRANBERRY PO, Take by mouth., Disp: , Rfl:  .  Cyanocobalamin 1000 MCG TBCR, Take by mouth., Disp: , Rfl:  .  fluticasone (FLONASE) 50 MCG/ACT nasal spray, Place 2 sprays into both nostrils daily., Disp: 16 g, Rfl: 6 .  ibuprofen (ADVIL,MOTRIN) 200 MG tablet, Take 200 mg by mouth every 6 (six) hours as needed., Disp: , Rfl:  .  Omega-3 Fatty Acids (FISH OIL) 1000 MG CPDR, Take by mouth., Disp: , Rfl:  .  benzonatate (TESSALON) 200 MG capsule, Take 1 capsule (200 mg total) by mouth 3 (three) times daily as needed for cough. (Patient not taking: Reported on 04/07/2017),  Disp: 30 capsule, Rfl: 0 .  predniSONE (STERAPRED UNI-PAK 21 TAB) 10 MG (21) TBPK tablet, 6 day taper; take as directed on package instructions (Patient not taking: Reported on 04/07/2017), Disp: 21 tablet, Rfl: 0  Review of Systems  Constitutional: Positive for chills and fatigue. Negative for activity change, appetite change, diaphoresis, fever and unexpected weight change.  HENT: Positive for congestion and sinus pressure. Negative for ear discharge, ear pain, nosebleeds, postnasal drip, rhinorrhea, sinus pain, sore throat and tinnitus.   Eyes: Negative.   Respiratory: Positive for cough. Negative for apnea, choking, chest tightness, shortness of breath, wheezing and stridor.   Gastrointestinal: Negative.   Musculoskeletal: Positive for myalgias and neck pain.       Neck and shoulder are sore.   Neurological: Negative for dizziness, light-headedness and headaches.    Social History   Tobacco Use  . Smoking status: Never Smoker  . Smokeless tobacco: Never Used  Substance Use Topics  . Alcohol use: No   Objective:   BP (!) 142/84 (BP Location: Left Arm, Patient Position: Sitting, Cuff Size: Normal)   Pulse 88   Temp 99.3 F (37.4 C) (Oral)   Resp 16   Wt 123 lb (55.8 kg)   SpO2 95%   BMI 21.79 kg/m  Vitals:   04/07/17 1334  BP: (!) 142/84  Pulse: 88  Resp: 16  Temp: 99.3 F (37.4 C)  TempSrc: Oral  SpO2: 95%  Weight: 123 lb (55.8 kg)     Physical Exam  Constitutional: She is oriented to person, place, and time. She appears well-developed and well-nourished.  Cardiovascular: Normal rate and regular rhythm.  Pulmonary/Chest: Effort normal and breath sounds normal. She has no wheezes. She has no rales.  Neurological: She is alert and oriented to person, place, and time.  Skin: Skin is warm and dry.  Psychiatric: She has a normal mood and affect. Her behavior is normal.        Assessment & Plan:     1. Pneumonia of both lower lobes due to infectious organism  [facility])  She continues to improve. Denies fever, continued cough. May use Tessalon perles at night if cough is interfering with sleep. Counseled on return precautions. She does not feel ready to return to work and so will write for another week. At this point, may follow up PRN, but she should remember that we will need f/u CXR 4-6 weeks from 03/28/2017. She may get this on walk in basis at [facility] on [address].  Return if symptoms worsen or fail to improve.  The entirety of the information documented in the History of Present Illness, Review of Systems and Physical Exam were personally obtained by me. Portions of this information were initially documented by Ashley Royalty, CMA and reviewed by me for thoroughness and accuracy.          Trinna Post, PA-C  [facility]

## 2017-04-07 NOTE — Patient Instructions (Signed)

## 2017-04-25 ENCOUNTER — Ambulatory Visit
Admission: RE | Admit: 2017-04-25 | Discharge: 2017-04-25 | Disposition: A | Discharge status: Home / Self Care | Payer: Medicare Other | Source: Ambulatory Visit | Attending: Physician Assistant | Admitting: Physician Assistant

## 2017-04-25 DIAGNOSIS — J189 Pneumonia, unspecified organism: Secondary | ICD-10-CM | POA: Diagnosis present

## 2017-04-25 DIAGNOSIS — R918 Other nonspecific abnormal finding of lung field: Secondary | ICD-10-CM | POA: Insufficient documentation

## 2017-04-28 ENCOUNTER — Other Ambulatory Visit: Payer: Self-pay | Admitting: Physician Assistant

## 2017-04-28 ENCOUNTER — Telehealth: Payer: Self-pay

## 2017-04-28 DIAGNOSIS — J189 Pneumonia, unspecified organism: Secondary | ICD-10-CM

## 2017-04-28 NOTE — Telephone Encounter (Signed)
LMTCB 04/28/2017  Thanks,   -Mickel Baas

## 2017-04-28 NOTE — Telephone Encounter (Signed)
Pt advised.   Thanks,   -Henrine Hayter  

## 2017-04-28 NOTE — Progress Notes (Signed)
Placed future CXR in 4 weeks she can get at [facility].

## 2017-04-28 NOTE — Telephone Encounter (Signed)
-----   Message from Trinna Post, [place] sent at 04/28/2017  3:31 PM EDT ----- Persistent but improving lower lobe infiltrate. Would suggest f/u CXR in four weeks to assess resolution. Will order and she can get it at outpatient imaging center on [address].

## 2017-05-02 ENCOUNTER — Other Ambulatory Visit: Payer: Self-pay | Admitting: Physician Assistant

## 2017-05-02 DIAGNOSIS — I10 Essential (primary) hypertension: Secondary | ICD-10-CM

## 2017-05-02 NOTE — Telephone Encounter (Signed)
Pt requesting refill of Amlodipine 5 MG sent to [facility] in [place]

## 2017-05-05 MED ORDER — AMLODIPINE BESYLATE 5 MG PO TABS: 5.0000 mg | ORAL_TABLET | Freq: Every day | ORAL | 1 refills | Status: DC

## 2017-05-05 NOTE — Telephone Encounter (Signed)
Please review. Thanks!  

## 2017-05-23 ENCOUNTER — Ambulatory Visit
Admission: RE | Admit: 2017-05-23 | Discharge: 2017-05-23 | Disposition: A | Discharge status: Home / Self Care | Payer: Medicare Other | Source: Ambulatory Visit | Attending: Physician Assistant | Admitting: Physician Assistant

## 2017-05-23 DIAGNOSIS — J189 Pneumonia, unspecified organism: Secondary | ICD-10-CM

## 2017-05-27 ENCOUNTER — Telehealth: Payer: Self-pay

## 2017-05-27 NOTE — Telephone Encounter (Signed)
Left message advising pt. OK per DPR. 

## 2017-05-27 NOTE — Telephone Encounter (Signed)
-----   Message from Trinna Post, [place] sent at 05/27/2017  1:51 PM EDT ----- CXR shows increasing improvement for pneumonia. No need for further CXR.

## 2017-07-10 ENCOUNTER — Encounter: Payer: Self-pay | Admitting: Physician Assistant

## 2017-07-10 ENCOUNTER — Ambulatory Visit: Payer: Medicare Other | Admitting: Physician Assistant

## 2017-07-10 VITALS — BP 136/80 | HR 69 | Temp 98.1°F | Resp 16 | Wt 123.0 lb

## 2017-07-10 DIAGNOSIS — J181 Lobar pneumonia, unspecified organism: Secondary | ICD-10-CM

## 2017-07-10 DIAGNOSIS — L659 Nonscarring hair loss, unspecified: Secondary | ICD-10-CM

## 2017-07-10 DIAGNOSIS — J189 Pneumonia, unspecified organism: Secondary | ICD-10-CM

## 2017-07-10 NOTE — Progress Notes (Signed)
Patient: Gina Middleton Female    DOB: 1935/11/21   82 y.o.   MRN: [id number] Visit Date: 07/10/2017  Today's Provider: [name], PA-C   Chief Complaint  Patient presents with  . Alopecia   Subjective:    HPI Patient here today C/O hair falling more since April. Patient reports that she has started taking Biotin and multivatmin daily. Patient reports she has not seen any kind of improvement yet with this OTC medications. She has seen her dermatologist as well but he did not take samples or mention hair loss. In February 2019 she did come down with a bad pneumonia infection. She did not completely clear this until 05/2017. She does report she is doing much better now.     Allergies  Allergen Reactions  . Other Anaphylaxis    A muscle relaxer in the 70s but cannot remember name of the medication that caused it  . Hydrocodone-Acetaminophen Other (See Comments)    dizziness  . Iodinated Diagnostic Agents   . Oxycodone-Acetaminophen      Current Outpatient Medications:  .  amLODipine (NORVASC) 5 MG tablet, Take 1 tablet (5 mg total) by mouth daily., Disp: 90 tablet, Rfl: 1 .  aspirin 81 MG tablet, Take by mouth., Disp: , Rfl:  .  Biotin 1000 MCG tablet, Take 1,000 mcg by mouth daily., Disp: , Rfl:  .  CRANBERRY PO, Take by mouth., Disp: , Rfl:  .  Cyanocobalamin 1000 MCG TBCR, Take by mouth., Disp: , Rfl:  .  ibuprofen (ADVIL,MOTRIN) 200 MG tablet, Take 200 mg by mouth every 6 (six) hours as needed., Disp: , Rfl:  .  Multiple Vitamin (MULTIVITAMIN) tablet, Take 1 tablet by mouth daily., Disp: , Rfl:  .  Omega-3 Fatty Acids (FISH OIL) 1000 MG CPDR, Take by mouth., Disp: , Rfl:   Review of Systems  Constitutional: Negative.   Respiratory: Negative.   Cardiovascular: Negative.   Skin: Negative.        Hair loss  Neurological: Negative.     Social History   Tobacco Use  . Smoking status: Never Smoker  . Smokeless tobacco: Never Used  Substance Use  Topics  . Alcohol use: No   Objective:   BP 136/80 (BP Location: Left Arm, Patient Position: Sitting, Cuff Size: Normal)   Pulse 69   Temp 98.1 F (36.7 C) (Oral)   Resp 16   Wt 123 lb (55.8 kg)   SpO2 98%   BMI 21.79 kg/m  Vitals:   07/10/17 1559  BP: 136/80  Pulse: 69  Resp: 16  Temp: 98.1 F (36.7 C)  TempSrc: Oral  SpO2: 98%  Weight: 123 lb (55.8 kg)     Physical Exam  Constitutional: She appears well-developed and well-nourished. No distress.  HENT:  Head: Normocephalic and atraumatic. Hair is abnormal (hair thinning on top of scalp in a linear fashion from approx midline to posterior scalp).  Right Ear: Hearing, tympanic membrane, external ear and ear canal normal.  Left Ear: Hearing, tympanic membrane, external ear and ear canal normal.  Nose: Nose normal.  Mouth/Throat: Uvula is midline, oropharynx is clear and moist and mucous membranes are normal.  Neck: Normal range of motion. Neck supple. No JVD present. No tracheal deviation present. No thyromegaly present.  Cardiovascular: Normal rate, regular rhythm and normal heart sounds. Exam reveals no gallop and no friction rub.  No murmur heard. Pulmonary/Chest: Effort normal and breath sounds normal. No respiratory distress. She  has no wheezes. She has no rales.  Lymphadenopathy:    She has no cervical adenopathy.  Skin: She is not diaphoretic.  Vitals reviewed.      Assessment & Plan:     1. Hair loss Will check labs as below to make sure no shifts secondary to the severe infection she had. I will f/u pending lab results. If normal suspect normal shedding response from severe illness. Discussed may shed up to 6 months. May continue Biotin and discussed adding Rogaine shampoo. She voiced understanding. I did discuss if her hair loss becomes more patchy she may be able to f/u with her dermatologists for scrapings to make sure it is not alopecia. She agrees.  - CBC w/Diff/Platelet - Comprehensive Metabolic Panel  (CMET) - TSH - Vitamin D (25 hydroxy) - B12  2. Pneumonia of left lower lobe due to infectious organism [facility]) Improved. See above medical treatment plan. - CBC w/Diff/Platelet - Comprehensive Metabolic Panel (CMET) - TSH - Vitamin D (25 hydroxy) - B12       [name], PA-C  [facility]

## 2017-07-10 NOTE — Patient Instructions (Signed)
Rogaine shampoo OTC for hair loss Biotin supplement ok to continue for hair loss

## 2017-07-11 ENCOUNTER — Telehealth: Payer: Self-pay

## 2017-07-11 LAB — COMPREHENSIVE METABOLIC PANEL
ALBUMIN: 4.2 g/dL (ref 3.5–4.7)
ALT: 10 IU/L (ref 0–32)
AST: 18 IU/L (ref 0–40)
Albumin/Globulin Ratio: 1.6 (ref 1.2–2.2)
Alkaline Phosphatase: 106 IU/L (ref 39–117)
BUN / CREAT RATIO: 21 (ref 12–28)
BUN: 17 mg/dL (ref 8–27)
Bilirubin Total: 0.4 mg/dL (ref 0.0–1.2)
CALCIUM: 9.4 mg/dL (ref 8.7–10.3)
CO2: 24 mmol/L (ref 20–29)
CREATININE: 0.82 mg/dL (ref 0.57–1.00)
Chloride: 105 mmol/L (ref 96–106)
GFR calc Af Amer: 78 mL/min/{1.73_m2} (ref >59)
GFR, EST NON AFRICAN AMERICAN: 68 mL/min/{1.73_m2} (ref >59)
GLOBULIN, TOTAL: 2.7 g/dL (ref 1.5–4.5)
GLUCOSE: 101 mg/dL — AB (ref 65–99)
Potassium: 3.9 mmol/L (ref 3.5–5.2)
SODIUM: 142 mmol/L (ref 134–144)
Total Protein: 6.9 g/dL (ref 6.0–8.5)

## 2017-07-11 LAB — CBC WITH DIFFERENTIAL/PLATELET
Basophils Absolute: 0.1 10*3/uL (ref 0.0–0.2)
Basos: 1 %
EOS (ABSOLUTE): 0.2 10*3/uL (ref 0.0–0.4)
EOS: 4 %
HEMATOCRIT: 34.6 % (ref 34.0–46.6)
HEMOGLOBIN: 11.8 g/dL (ref 11.1–15.9)
IMMATURE GRANS (ABS): 0 10*3/uL (ref 0.0–0.1)
IMMATURE GRANULOCYTES: 0 %
Lymphocytes Absolute: 1.5 10*3/uL (ref 0.7–3.1)
Lymphs: 25 %
MCH: 29.8 pg (ref 26.6–33.0)
MCHC: 34.1 g/dL (ref 31.5–35.7)
MCV: 87 fL (ref 79–97)
MONOCYTES: 8 %
Monocytes Absolute: 0.5 10*3/uL (ref 0.1–0.9)
NEUTROS PCT: 62 %
Neutrophils Absolute: 3.8 10*3/uL (ref 1.4–7.0)
Platelets: 210 10*3/uL (ref 150–450)
RBC: 3.96 x10E6/uL (ref 3.77–5.28)
RDW: 14.1 % (ref 12.3–15.4)
WBC: 6 10*3/uL (ref 3.4–10.8)

## 2017-07-11 LAB — TSH: TSH: 1.61 u[IU]/mL (ref 0.450–4.500)

## 2017-07-11 LAB — VITAMIN B12: Vitamin B-12: 711 pg/mL (ref 232–1245)

## 2017-07-11 LAB — VITAMIN D 25 HYDROXY (VIT D DEFICIENCY, FRACTURES): VIT D 25 HYDROXY: 25.7 ng/mL — AB (ref 30.0–100.0)

## 2017-07-11 NOTE — Telephone Encounter (Signed)
Pt returned call and requested call back at work# located in contact info. Please advise. Thanks TNP

## 2017-07-11 NOTE — Telephone Encounter (Signed)
Patient advised as below.  

## 2017-07-11 NOTE — Telephone Encounter (Signed)
LMTCB

## 2017-07-11 NOTE — Telephone Encounter (Signed)
-----   Message from [name], [place] sent at 07/11/2017  8:34 AM EDT ----- All labs are unremarkable and normal except Vit D which remains low. Would recommend to take an OTC Vit D supplement of 1000-2000 IU daily. If you are already on a supplement I can send in the high dose Vit D for you.

## 2017-09-12 ENCOUNTER — Ambulatory Visit: Payer: Medicare Other | Admitting: Physician Assistant

## 2017-09-12 ENCOUNTER — Encounter: Payer: Self-pay | Admitting: Physician Assistant

## 2017-09-12 VITALS — BP 128/68 | HR 72 | Temp 98.3°F | Resp 16 | Wt 123.6 lb

## 2017-09-12 DIAGNOSIS — Z1231 Encounter for screening mammogram for malignant neoplasm of breast: Secondary | ICD-10-CM

## 2017-09-12 DIAGNOSIS — I10 Essential (primary) hypertension: Secondary | ICD-10-CM | POA: Diagnosis not present

## 2017-09-12 DIAGNOSIS — Z23 Encounter for immunization: Secondary | ICD-10-CM | POA: Diagnosis not present

## 2017-09-12 DIAGNOSIS — G5601 Carpal tunnel syndrome, right upper limb: Secondary | ICD-10-CM

## 2017-09-12 DIAGNOSIS — Z1239 Encounter for other screening for malignant neoplasm of breast: Secondary | ICD-10-CM

## 2017-09-12 MED ORDER — AMLODIPINE BESYLATE 5 MG PO TABS: 5.0000 mg | ORAL_TABLET | Freq: Every day | ORAL | 1 refills | Status: DC

## 2017-09-12 NOTE — Patient Instructions (Signed)
Get a cockup wrist splint for the right wrist  Carpal Tunnel Syndrome Carpal tunnel syndrome is a condition that causes pain in your hand and arm. The carpal tunnel is a narrow area that is on the palm side of your wrist. Repeated wrist motion or certain diseases may cause swelling in the tunnel. This swelling can pinch the main nerve in the wrist (median nerve). Follow these instructions at home: If you have a splint:  Wear it as told by your doctor. Remove it only as told by your doctor.  Loosen the splint if your fingers: ? Become numb and tingle. ? Turn blue and cold.  Keep the splint clean and dry. General instructions  Take over-the-counter and prescription medicines only as told by your doctor.  Rest your wrist from any activity that may be causing your pain. If needed, talk to your employer about changes that can be made in your work, such as getting a wrist pad to use while typing.  If directed, apply ice to the painful area: ? Put ice in a plastic bag. ? Place a towel between your skin and the bag. ? Leave the ice on for 20 minutes, 2-3 times per day.  Keep all follow-up visits as told by your doctor. This is important.  Do any exercises as told by your doctor, physical therapist, or occupational therapist. Contact a doctor if:  You have new symptoms.  Medicine does not help your pain.  Your symptoms get worse. This information is not intended to replace advice given to you by your health care provider. Make sure you discuss any questions you have with your health care provider. Document Released: 01/24/2011 Document Revised: 07/13/2015 Document Reviewed: 06/22/2014 Elsevier Interactive Patient Education  [employer].

## 2017-09-12 NOTE — Progress Notes (Signed)
Patient: Gina Middleton Female    DOB: Dec 19, 1935   82 y.o.   MRN: [id number] Visit Date: 09/12/2017  Today's Provider: [name], PA-C   Chief Complaint  Patient presents with  . Follow-up    HTN   Subjective:    HPI  Hypertension, follow-up:  BP Readings from Last 3 Encounters:  09/12/17 128/68  07/10/17 136/80  04/07/17 (!) 142/84    She was last seen for hypertension 7 months ago.  BP at that visit was 158/70. Management since that visit includes none She reports excellent compliance with treatment. She is not having side effects.   Outside blood pressures are n/a She is experiencing none.  Patient denies chest pain, chest pressure/discomfort, claudication, exertional chest pressure/discomfort, fatigue, irregular heart beat, lower extremity edema, near-syncope and palpitations.   Cardiovascular risk factors include advanced age (older than 63 for men, 38 for women) and hypertension.  Use of agents associated with hypertension: none.     Weight trend: stable Wt Readings from Last 3 Encounters:  09/12/17 123 lb 9.6 oz (56.1 kg)  07/10/17 123 lb (55.8 kg)  04/07/17 123 lb (55.8 kg)    She reports that three out of her fingers, right hand tingles. She reports that hen she writes she needs to stop because they go numb. She notice a month ago off and on and here lately is constantly.    Allergies  Allergen Reactions  . Other Anaphylaxis    A muscle relaxer in the 70s but cannot remember name of the medication that caused it  . Hydrocodone-Acetaminophen Other (See Comments)    dizziness  . Iodinated Diagnostic Agents   . Oxycodone-Acetaminophen      Current Outpatient Medications:  .  amLODipine (NORVASC) 5 MG tablet, Take 1 tablet (5 mg total) by mouth daily., Disp: 90 tablet, Rfl: 1 .  aspirin 81 MG tablet, Take by mouth., Disp: , Rfl:  .  Biotin 1000 MCG tablet, Take 1,000 mcg by mouth daily., Disp: , Rfl:  .  CRANBERRY PO, Take by  mouth., Disp: , Rfl:  .  Cyanocobalamin 1000 MCG TBCR, Take by mouth., Disp: , Rfl:  .  ibuprofen (ADVIL,MOTRIN) 200 MG tablet, Take 100 mg by mouth every 6 (six) hours as needed. , Disp: , Rfl:  .  Omega-3 Fatty Acids (FISH OIL) 1000 MG CPDR, Take by mouth., Disp: , Rfl:   Review of Systems  Constitutional: Negative.   Respiratory: Negative.   Cardiovascular: Negative.   Musculoskeletal: Positive for arthralgias (right wrist).  Neurological: Positive for numbness (right hand).    Social History   Tobacco Use  . Smoking status: Never Smoker  . Smokeless tobacco: Never Used  Substance Use Topics  . Alcohol use: No   Objective:   BP 128/68 (BP Location: Left Arm, Patient Position: Sitting, Cuff Size: Normal)   Pulse 72   Temp 98.3 F (36.8 C) (Oral)   Resp 16   Wt 123 lb 9.6 oz (56.1 kg)   BMI 21.89 kg/m  Vitals:   09/12/17 1050  BP: 128/68  Pulse: 72  Resp: 16  Temp: 98.3 F (36.8 C)  TempSrc: Oral  Weight: 123 lb 9.6 oz (56.1 kg)     Physical Exam  Constitutional: She appears well-developed and well-nourished. No distress.  Neck: Normal range of motion. Neck supple.  Cardiovascular: Normal rate, regular rhythm and normal heart sounds. Exam reveals no gallop and no friction rub.  No murmur heard.  Pulmonary/Chest: Effort normal and breath sounds normal. No respiratory distress. She has no wheezes. She has no rales.  Musculoskeletal:       Right wrist: Normal.  Positive Tinel and Phalen test for carpal tunnel of right wrist  Skin: She is not diaphoretic.  Vitals reviewed.      Assessment & Plan:     1. Carpal tunnel syndrome of right wrist Conservative measures with NSAIDs and bracing discussed. She is to call if no improvements or worsens and I will refer to orthopedics.   2. Essential hypertension Stable. Diagnosis pulled for medication refill. Continue current medical treatment plan. - amLODipine (NORVASC) 5 MG tablet; Take 1 tablet (5 mg total) by mouth  daily.  Dispense: 90 tablet; Refill: 1  3. Breast cancer screening There is family history of breast cancer in her mother. She does perform regular self breast exams. Mammogram was ordered as below. Information for [facility] Breast clinic was given to patient so she may schedule her mammogram at her convenience. - MM 3D SCREEN BREAST BILATERAL; Future  4. Need for pneumococcal vaccination Pneumococcal 23 Vaccine given to patient without complications. Patient sat for 15 minutes after administration and was tolerated well without adverse effects. - Pneumococcal polysaccharide vaccine 23-valent greater than or equal to 2yo subcutaneous/IM       [name], PA-C  [place]

## 2017-11-19 ENCOUNTER — Ambulatory Visit
Admission: RE | Admit: 2017-11-19 | Discharge: 2017-11-19 | Disposition: A | Discharge status: Home / Self Care | Payer: Medicare Other | Source: Ambulatory Visit | Attending: Physician Assistant | Admitting: Physician Assistant

## 2017-11-19 DIAGNOSIS — Z1231 Encounter for screening mammogram for malignant neoplasm of breast: Secondary | ICD-10-CM | POA: Diagnosis present

## 2017-11-19 DIAGNOSIS — Z1239 Encounter for other screening for malignant neoplasm of breast: Secondary | ICD-10-CM

## 2017-11-20 ENCOUNTER — Telehealth: Payer: Self-pay

## 2017-11-20 NOTE — Telephone Encounter (Signed)
Pt returned call. Please advise. Thanks TNP °

## 2017-11-20 NOTE — Telephone Encounter (Signed)
-----   Message from [name], PA-C sent at 11/20/2017 10:20 AM EDT ----- Normal mammogram. Repeat screening in one year.

## 2017-11-20 NOTE — Telephone Encounter (Signed)
LMTCB  Thanks,  -Laksh Hinners 

## 2017-11-21 NOTE — Telephone Encounter (Signed)
LMTCB

## 2017-11-25 NOTE — Telephone Encounter (Signed)
LM that Mammogram is normal.

## 2018-01-14 IMAGING — CT CT RENAL STONE PROTOCOL
2 of 4 series · 16 of 46 positions shown, 18 images · non-contrast
Comparison: 09/01/2009 CT

CLINICAL DATA: Right flank pain, nausea and vomiting.

EXAM:
CT ABDOMEN AND PELVIS WITHOUT CONTRAST
TECHNIQUE: Multidetector CT imaging of the abdomen and pelvis was performed
following the standard protocol without IV contrast.

[Series 2: stone full standard · axial · 0.70mm/px · z∈[-1279,-909]mm · 13 of 82 slices shown, 15 images]
[im 4/82  soft-tissue]
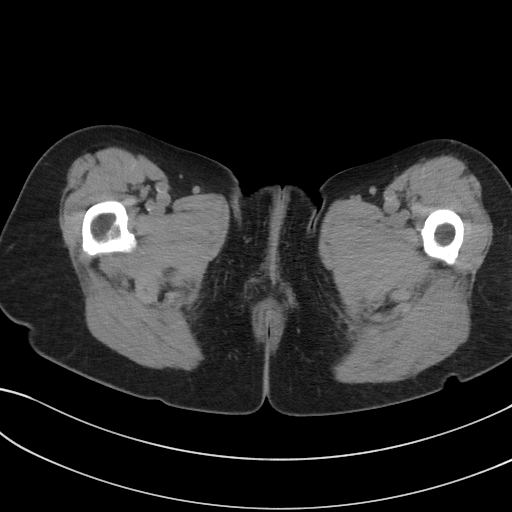
[im 4/82  bone]
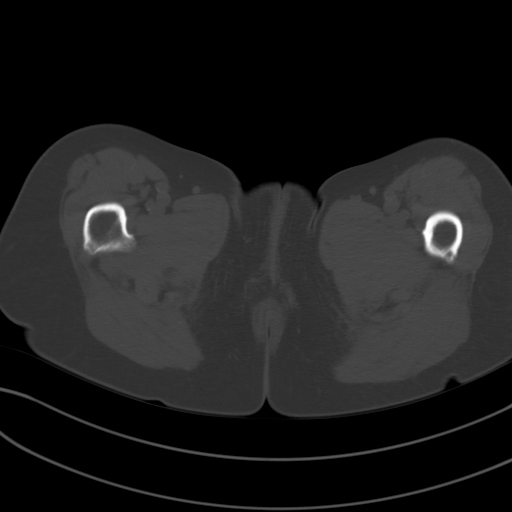
[im 10/82  soft-tissue]
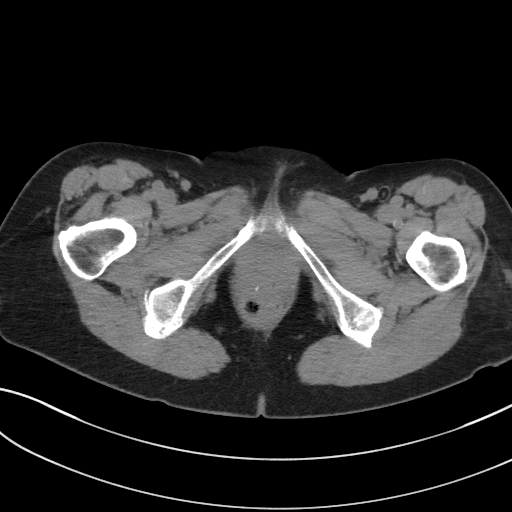
[im 17/82  soft-tissue]
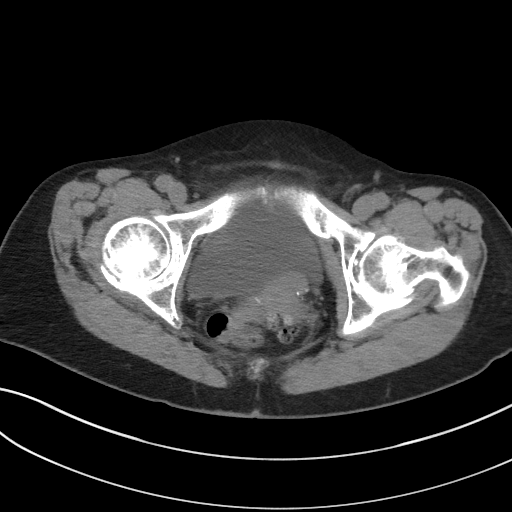
[im 23/82  soft-tissue]
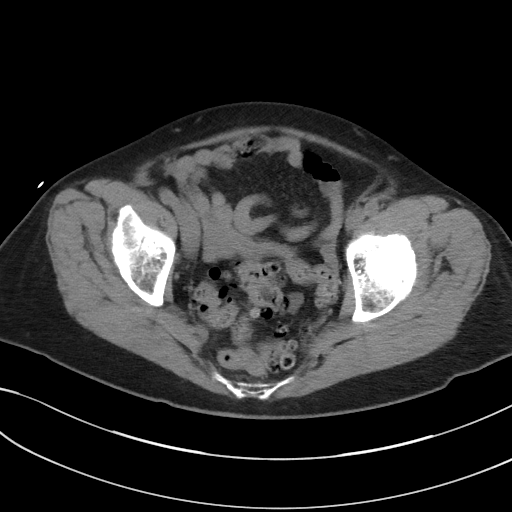
[im 30/82  soft-tissue]
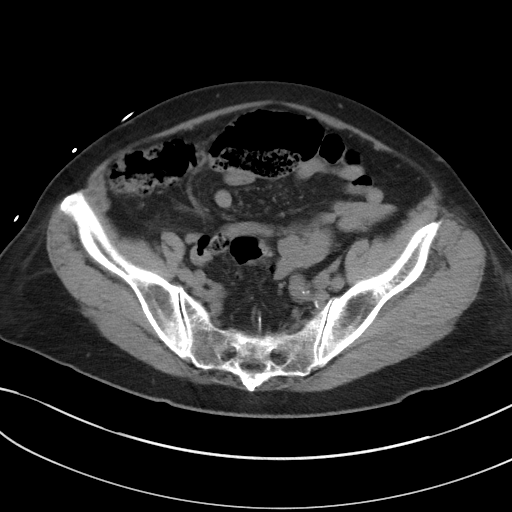
[im 36/82  soft-tissue]
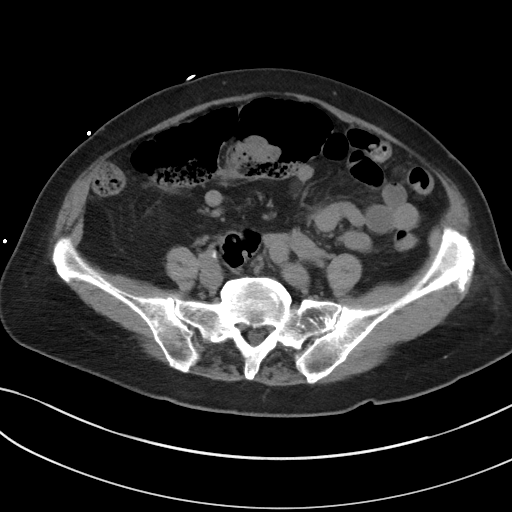
[im 43/82  soft-tissue]
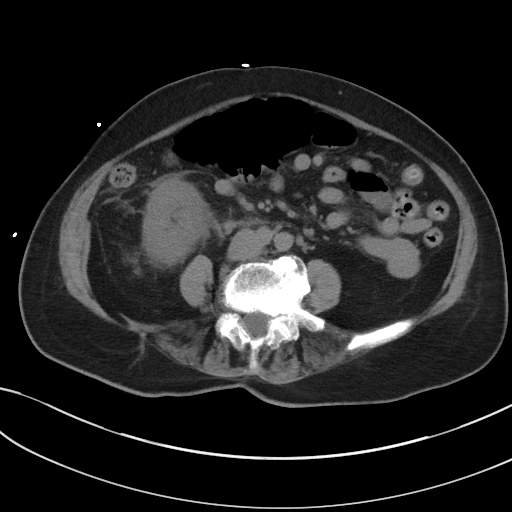
[im 46/82  soft-tissue]
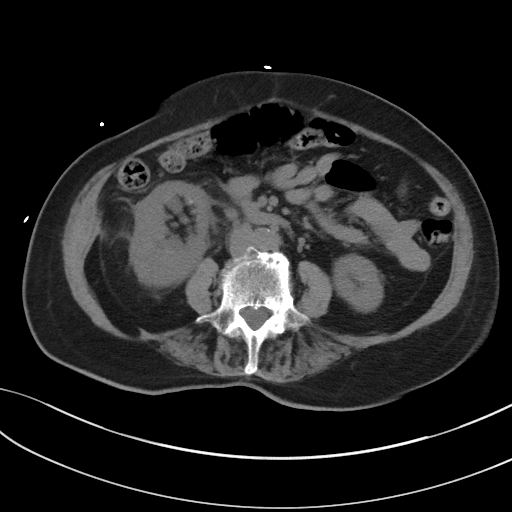
[im 52/82  soft-tissue]
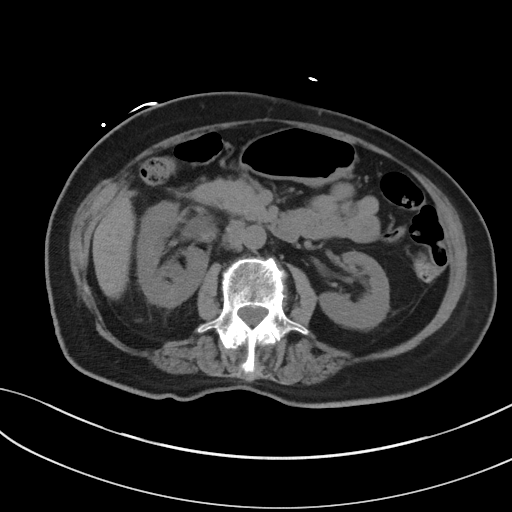
[im 52/82  bone]
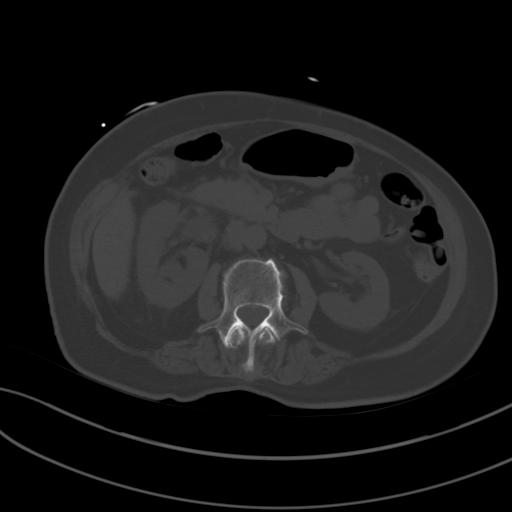
[im 59/82  soft-tissue]
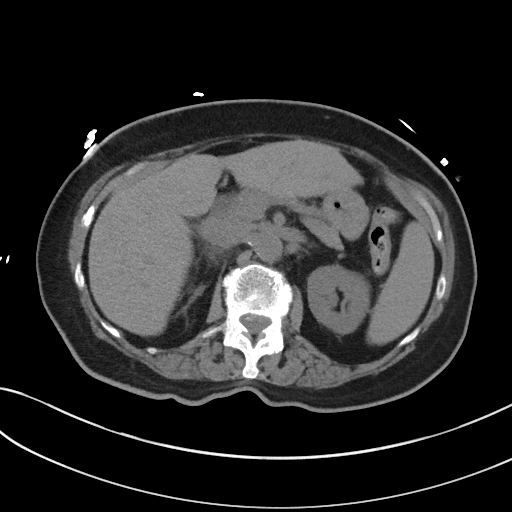
[im 65/82  soft-tissue]
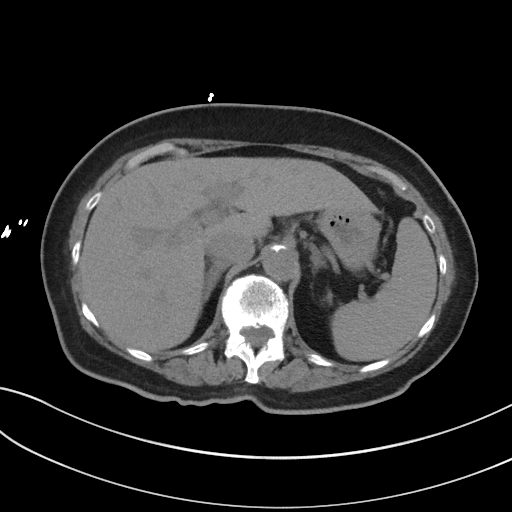
[im 72/82  soft-tissue]
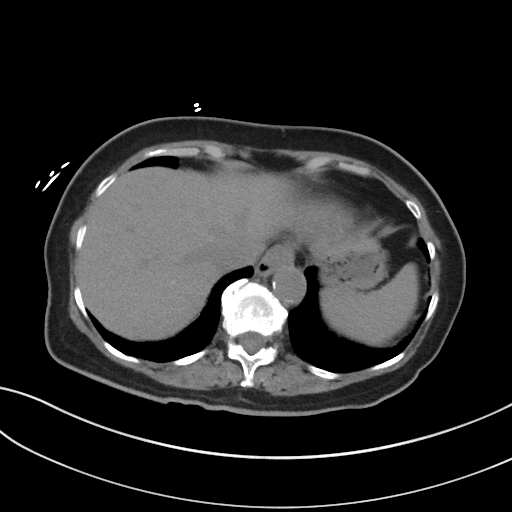
[im 78/82  soft-tissue]
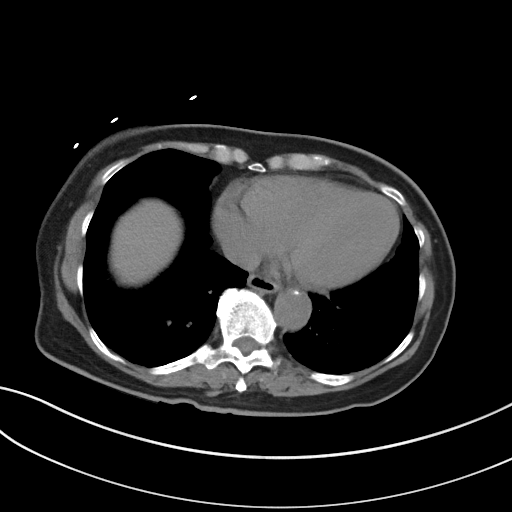

[Series 5: coronal · coronal · 0.68mm/px · 3 of 121 slices shown]
[im 41/121  soft-tissue]
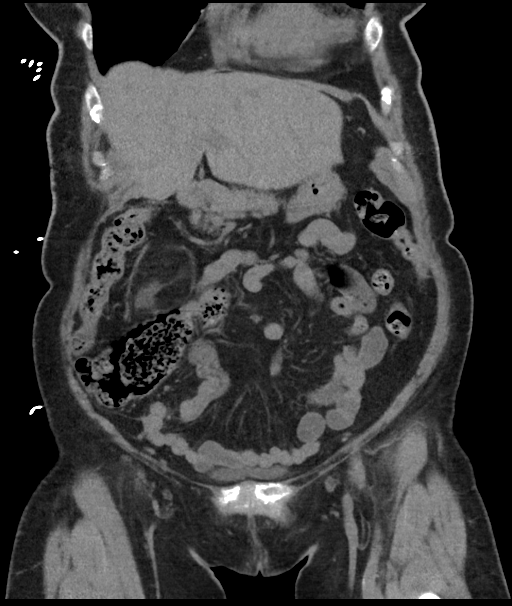
[im 54/121  soft-tissue]
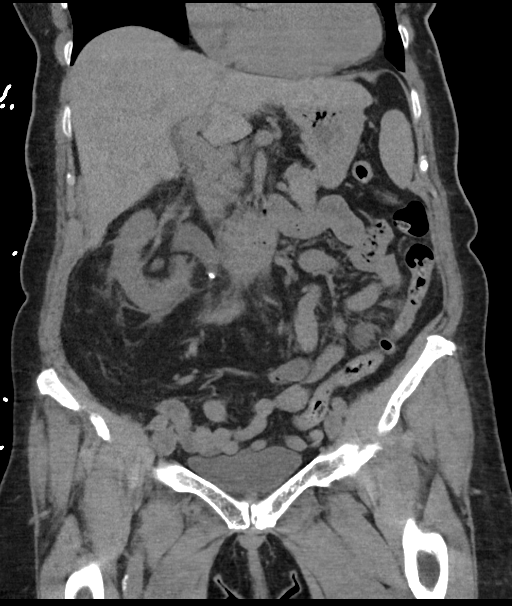
[im 67/121  soft-tissue]
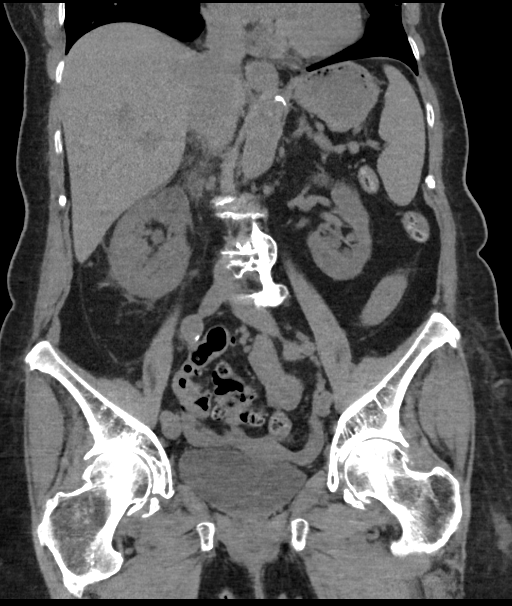

[16 of 46 positions shown; findings below may reference images not displayed]

FINDINGS: Lower chest: Stable cardiomegaly with bibasilar atelectasis.

Hepatobiliary: Cholecystectomy. No biliary dilatation. No
space-occupying mass of the unenhanced liver.

Pancreas: No ductal dilatation the pancreas or focal mass.

Spleen: Normal

Adrenals/Urinary Tract: Normal bilateral adrenal glands. Right 3 mm
UPJ stone causing perinephric fat stranding and mild to moderate
proximal hydroureteronephrosis. Additional 2 mm calculi in the
interpolar and lower pole of the right kidney. Cyst in the upper
pole of the right kidney is slightly larger than on prior comparison
study now estimated at 2.1 x 1.7 cm versus 1.2 by 0.8 cm previously.
Small parapelvic renal cysts on the left. The bladder is
physiologically distended without calculus.

Stomach/Bowel: No bowel obstruction. Moderate colonic stool burden.
No acute inflammation. Contracted stomach. Normal small bowel
rotation. The appendix is surgically absent.

Vascular/Lymphatic: Aortic atherosclerosis. No enlarged abdominal or
pelvic lymph nodes.

Reproductive: Uterus and bilateral adnexa are unremarkable.

Other: No free air nor free fluid.

Musculoskeletal: Lumbar spondylosis.
IMPRESSION: 1. Mild-to-moderate right-sided proximal hydroureteronephrosis
secondary to a 3 mm UPJ stone. Nonobstructing interpolar and right
lower pole renal calculi. Left-sided parapelvic renal cysts.
2. Cholecystectomy and appendectomy.
3. Aortic atherosclerosis.
4. Multilevel degenerative disc space narrowing of the lumbar spine
consistent with lumbar spondylosis.

## 2018-02-06 ENCOUNTER — Telehealth: Payer: Self-pay

## 2018-02-06 NOTE — Telephone Encounter (Signed)
LMTCB and schedule AWV. Needs to be scheduled sometime between 02/13/18 - 02/24/18. -MM

## 2018-02-13 NOTE — Telephone Encounter (Signed)
Spoke with husband who states he will have pt CB when she is available.  -MM

## 2018-02-23 NOTE — Telephone Encounter (Signed)
Scheduled for 02/24/18 @ 1:20 (awv) and 2:00 (cpe). -MM

## 2018-02-24 ENCOUNTER — Ambulatory Visit (INDEPENDENT_AMBULATORY_CARE_PROVIDER_SITE_OTHER): Payer: Medicare Other | Admitting: Physician Assistant

## 2018-02-24 ENCOUNTER — Encounter: Payer: Self-pay | Admitting: Physician Assistant

## 2018-02-24 ENCOUNTER — Ambulatory Visit (INDEPENDENT_AMBULATORY_CARE_PROVIDER_SITE_OTHER): Payer: Medicare Other

## 2018-02-24 VITALS — BP 160/84 | HR 70 | Temp 98.7°F | Ht 63.0 in | Wt 123.6 lb

## 2018-02-24 DIAGNOSIS — I10 Essential (primary) hypertension: Secondary | ICD-10-CM

## 2018-02-24 DIAGNOSIS — Z0000 Encounter for general adult medical examination without abnormal findings: Secondary | ICD-10-CM

## 2018-02-24 DIAGNOSIS — Z789 Other specified health status: Secondary | ICD-10-CM

## 2018-02-24 DIAGNOSIS — E538 Deficiency of other specified B group vitamins: Secondary | ICD-10-CM | POA: Diagnosis not present

## 2018-02-24 DIAGNOSIS — R739 Hyperglycemia, unspecified: Secondary | ICD-10-CM

## 2018-02-24 DIAGNOSIS — E559 Vitamin D deficiency, unspecified: Secondary | ICD-10-CM

## 2018-02-24 NOTE — Progress Notes (Signed)
Patient: Gina Middleton, Female    DOB: 04/16/1935, 83 y.o.   MRN: [id number] Visit Date: 02/24/2018  Today's Provider: [name], PA-C   Chief Complaint  Patient presents with  . Annual Exam   Subjective:   Patient saw [facility]enzie today for AWV.   Complete Physical Gina Middleton is a 83 y.o. female. She feels well. She reports exercising, walking. She reports she is sleeping well. ----------------------------------------------------------   Review of Systems  Constitutional: Negative.   HENT: Negative.   Eyes: Negative.   Respiratory: Negative.   Cardiovascular: Negative.   Gastrointestinal: Negative.   Endocrine: Negative.   Genitourinary: Negative.   Musculoskeletal: Positive for arthralgias.  Skin: Negative.   Allergic/Immunologic: Negative.   Neurological: Positive for numbness.  Hematological: Negative.   Psychiatric/Behavioral: Negative.     Social History   Socioeconomic History  . Marital status: Married    Spouse name: Kyung Rudd  . Number of children: 0  . Years of education: College  . Highest education level: Master's degree (e.g., MA, MS, MEng, MEd, MSW, MBA)  Occupational History  . Occupation: Retired Programmer, multimedia  . Occupation: Works part-time at [employer] and [employer]  . Occupation: [employer]    Employer: [employer]    Comment: Part Time  Social Needs  . Financial resource strain: Not hard at all  . Food insecurity:    Worry: Never true    Inability: Never true  . Transportation needs:    Medical: No    Non-medical: No  Tobacco Use  . Smoking status: Never Smoker  . Smokeless tobacco: Never Used  Substance and Sexual Activity  . Alcohol use: No  . Drug use: No  . Sexual activity: Never  Lifestyle  . Physical activity:    Days per week: 0 days    Minutes per session: 0 min  . Stress: Not at all  Relationships  . Social connections:    Talks on phone: Patient refused    Gets together:  Patient refused    Attends religious service: Patient refused    Active member of club or organization: Patient refused    Attends meetings of clubs or organizations: Patient refused    Relationship status: Patient refused  . Intimate partner violence:    Fear of current or ex partner: Patient refused    Emotionally abused: Patient refused    Physically abused: Patient refused    Forced sexual activity: Patient refused  Other Topics Concern  . Not on file  Social History Narrative  . Not on file    Past Medical History:  Diagnosis Date  . Hypertension   . Renal disorder      Patient Active Problem List   Diagnosis Date Noted  . Achilles tendonitis 05/12/2015  . Allergic rhinitis 09/20/2014  . Benign paroxysmal positional nystagmus 09/20/2014  . Blood glucose elevated 09/20/2014  . Neuralgia neuritis, sciatic nerve 09/20/2014  . Vegetarian 09/20/2014  . B12 deficiency 09/20/2014  . Avitaminosis D 09/20/2014  . Hypertension 09/07/2014    Past Surgical History:  Procedure Laterality Date  . APPENDECTOMY    . [place]  . CHOLECYSTECTOMY      Her family history includes Alzheimer's disease in her mother; Breast cancer in her maternal grandmother; Diabetes in her sister; Heart attack in her father and sister; Hypertension in her mother.      Current Outpatient Medications:  .  amLODipine (NORVASC) 5 MG tablet,  Take 1 tablet (5 mg total) by mouth daily., Disp: 90 tablet, Rfl: 1 .  aspirin 81 MG tablet, Take 81 mg by mouth daily. , Disp: , Rfl:  .  Biotin 1000 MCG tablet, Take 1,000 mcg by mouth daily., Disp: , Rfl:  .  Cholecalciferol (VITAMIN D3) 25 MCG (1000 UT) CAPS, Take by mouth daily., Disp: , Rfl:  .  CRANBERRY PO, Take by mouth daily. Skips occasionally, Disp: , Rfl:  .  Cyanocobalamin 1000 MCG TBCR, Take by mouth daily. , Disp: , Rfl:  .  ibuprofen (ADVIL,MOTRIN) 200 MG tablet, Take 100 mg by mouth every 6 (six) hours as needed. , Disp: , Rfl:  .   Omega-3 Fatty Acids (FISH OIL) 1000 MG CPDR, Take by mouth., Disp: , Rfl:   Patient Care Team: [name], PA-C as PCP - General (Family Medicine) Ralene Bathe, MD (Dermatology)     Objective:    Vitals: BP 160/84 (BP Location: Right Arm)   Pulse 70   Temp 98.7 F (37.1 C) (Oral)   Ht 5\' 3"  (1.6 m)   Wt 123 lb 9.6 oz (56.1 kg)   BMI 21.89 kg/m  BSA 1.58 m   Physical Exam Vitals signs reviewed.  Constitutional:      General: She is not in acute distress.    Appearance: She is well-developed. She is not diaphoretic.  HENT:     Head: Normocephalic and atraumatic.     Right Ear: Hearing, tympanic membrane, ear canal and external ear normal.     Left Ear: Hearing, tympanic membrane, ear canal and external ear normal.     Nose: Nose normal.     Mouth/Throat:     Lips: Pink.     Pharynx: Oropharynx is clear. Uvula midline. No oropharyngeal exudate.  Eyes:     General: No scleral icterus.       Right eye: No discharge.        Left eye: No discharge.     Conjunctiva/sclera: Conjunctivae normal.     Pupils: Pupils are equal, round, and reactive to light.  Neck:     Musculoskeletal: Normal range of motion and neck supple.     Thyroid: No thyromegaly.     Vascular: No carotid bruit or JVD.     Trachea: No tracheal deviation.  Cardiovascular:     Rate and Rhythm: Normal rate and regular rhythm.     Heart sounds: Normal heart sounds. No murmur. No friction rub. No gallop.   Pulmonary:     Effort: Pulmonary effort is normal. No respiratory distress.     Breath sounds: Normal breath sounds. No wheezing or rales.  Chest:     Chest wall: No tenderness.  Abdominal:     General: Bowel sounds are normal. There is no distension.     Palpations: Abdomen is soft. There is no mass.     Tenderness: There is no abdominal tenderness. There is no guarding or rebound.  Musculoskeletal: Normal range of motion.        General: No tenderness.  Lymphadenopathy:     Cervical:  No cervical adenopathy.  Skin:    General: Skin is warm and dry.     Findings: No rash.  Neurological:     Mental Status: She is alert and oriented to person, place, and time.  Psychiatric:        Behavior: Behavior normal.        Thought Content: Thought content normal.  Judgment: Judgment normal.     Activities of Daily Living In your present state of health, do you have any difficulty performing the following activities: 02/24/2018  Hearing? N  Vision? N  Difficulty concentrating or making decisions? N  Walking or climbing stairs? Y  Comment Due to right hip pain.   Dressing or bathing? N  Doing errands, shopping? N  Preparing Food and eating ? N  Using the Toilet? N  In the past six months, have you accidently leaked urine? N  Do you have problems with loss of bowel control? N  Managing your Medications? N  Managing your Finances? N  Housekeeping or managing your Housekeeping? N  Some recent data might be hidden    Fall Risk Assessment Fall Risk  02/24/2018 02/12/2017 08/28/2016 02/08/2016 01/25/2015  Falls in the past year? 0 No No No No  Number falls in past yr: 0 - - - -  Injury with Fall? 0 - - - -     Depression Screen PHQ 2/9 Scores 02/24/2018 02/24/2018 02/12/2017 08/28/2016  PHQ - 2 Score 0 0 0 0  PHQ- 9 Score 0 - - -    6CIT Screen 02/24/2018  What Year? 0 points  What month? 0 points  What time? 0 points  Count back from 20 0 points  Months in reverse 0 points  Repeat phrase 0 points  Total Score 0      Assessment & Plan:    Annual Physical Reviewed patient's Family Medical History Reviewed and updated list of patient's medical providers Assessment of cognitive impairment was done Assessed patient's functional ability Established a written schedule for health screening [place] Completed and Reviewed  Exercise Activities and Dietary recommendations Goals    . DIET - INCREASE WATER INTAKE     Recommend to drink at least  6-8 8oz glasses of water per day.       Immunization History  Administered Date(s) Administered  . Pneumococcal Conjugate-13 08/28/2016  . Pneumococcal Polysaccharide-23 09/12/2017  . Td 05/10/1995    Health Maintenance  Topic Date Due  . TETANUS/TDAP  05/09/2005  . INFLUENZA VACCINE  05/19/2018 (Originally 09/18/2017)  . DEXA SCAN  02/08/2020  . PNA vac Low Risk Adult  Completed     Discussed health benefits of physical activity, and encouraged her to engage in regular exercise appropriate for her age and condition.   1. Annual physical exam Normal physical exam today. Will check labs as below and f/u pending lab results. If labs are stable and WNL she will not need to have these rechecked for one year at her next annual physical exam. She is to call the office in the meantime if she has any acute issue, questions or concerns. - CBC w/Diff/Platelet - Comprehensive Metabolic Panel (CMET) - TSH - Lipid Profile - HgB A1c  2. Essential hypertension Slightly elevated today. Continue amlodipine 5mg . Will check labs as below and f/u pending results. 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 - CBC w/Diff/Platelet - Comprehensive Metabolic Panel (CMET) - Lipid Profile - HgB A1c  3. Avitaminosis D H/O this and post menopausal. Will check labs as below and f/u pending results. - CBC w/Diff/Platelet - Vitamin D (25 hydroxy)  4. B12 deficiency Was normal last May 2019.  - CBC w/Diff/Platelet  5. Blood glucose elevated H/O this. Will check labs as below and f/u pending results. - Comprehensive  Metabolic Panel (CMET) - Lipid Profile - HgB A1c  6. Vegetarian Weight normal. Tries to get in plenty of plant based protein. Will check labs as below and f/u pending results. - Comprehensive Metabolic Panel (CMET) - Lipid Profile     ------------------------------------------------------------------------------------------------------------    [name], PA-C  [facility]

## 2018-02-24 NOTE — Patient Instructions (Signed)
Health Maintenance After Age 81 After age 81, you are at a higher risk for certain long-term diseases and infections as well as injuries from falls. Falls are a major cause of broken bones and head injuries in people who are older than age 81. Getting regular preventive care can help to keep you healthy and well. Preventive care includes getting regular testing and making lifestyle changes as recommended by your health care provider. Talk with your health care provider about:  Which screenings and tests you should have. A screening is a test that checks for a disease when you have no symptoms.  A diet and exercise plan that is right for you. What should I know about screenings and tests to prevent falls? Screening and testing are the best ways to find a health problem early. Early diagnosis and treatment give you the best chance of managing medical conditions that are common after age 81. Certain conditions and lifestyle choices may make you more likely to have a fall. Your health care provider may recommend:  Regular vision checks. Poor vision and conditions such as cataracts can make you more likely to have a fall. If you wear glasses, make sure to get your prescription updated if your vision changes.  Medicine review. Work with your health care provider to regularly review all of the medicines you are taking, including over-the-counter medicines. Ask your health care provider about any side effects that may make you more likely to have a fall. Tell your health care provider if any medicines that you take make you feel dizzy or sleepy.  Osteoporosis screening. Osteoporosis is a condition that causes the bones to get weaker. This can make the bones weak and cause them to break more easily.  Blood pressure screening. Blood pressure changes and medicines to control blood pressure can make you feel dizzy.  Strength and balance checks. Your health care provider may recommend certain tests to check your  strength and balance while standing, walking, or changing positions.  Foot health exam. Foot pain and numbness, as well as not wearing proper footwear, can make you more likely to have a fall.  Depression screening. You may be more likely to have a fall if you have a fear of falling, feel emotionally low, or feel unable to do activities that you used to do.  Alcohol use screening. Using too much alcohol can affect your balance and may make you more likely to have a fall. What actions can I take to lower my risk of falls? General instructions  Talk with your health care provider about your risks for falling. Tell your health care provider if: ? You fall. Be sure to tell your health care provider about all falls, even ones that seem minor. ? You feel dizzy, sleepy, or off-balance.  Take over-the-counter and prescription medicines only as told by your health care provider. These include any supplements.  Eat a healthy diet and maintain a healthy weight. A healthy diet includes low-fat dairy products, low-fat (lean) meats, and fiber from whole grains, beans, and lots of fruits and vegetables. Home safety  Remove any tripping hazards, such as rugs, cords, and clutter.  Install safety equipment such as grab bars in bathrooms and safety rails on stairs.  Keep rooms and walkways well-lit. Activity   Follow a regular exercise program to stay fit. This will help you maintain your balance. Ask your health care provider what types of exercise are appropriate for you.  If you need a cane or   walker, use it as recommended by your health care provider.  Wear supportive shoes that have nonskid soles. Lifestyle  Do not drink alcohol if your health care provider tells you not to drink.  If you drink alcohol, limit how much you have: ? 0-1 drink a day for women. ? 0-2 drinks a day for men.  Be aware of how much alcohol is in your drink. In the [place] one drink equals one typical bottle of beer (12  oz), one-half glass of wine (5 oz), or one shot of hard liquor (1 oz).  Do not use any products that contain nicotine or tobacco, such as cigarettes and e-cigarettes. If you need help quitting, ask your health care provider. Summary  Having a healthy lifestyle and getting preventive care can help to protect your health and wellness after age 81.  Screening and testing are the best way to find a health problem early and help you avoid having a fall. Early diagnosis and treatment give you the best chance for managing medical conditions that are more common for people who are older than age 81.  Falls are a major cause of broken bones and head injuries in people who are older than age 81. Take precautions to prevent a fall at home.  Work with your health care provider to learn what changes you can make to improve your health and wellness and to prevent falls. This information is not intended to replace advice given to you by your health care provider. Make sure you discuss any questions you have with your health care provider. Document Released: 12/18/2016 Document Revised: 12/18/2016 Document Reviewed: 12/18/2016 Elsevier Interactive Patient Education  2019 [employer]  

## 2018-02-24 NOTE — Patient Instructions (Signed)
Gina Middleton , Thank you for taking time to come for your Medicare Wellness Visit. I appreciate your ongoing commitment to your health goals. Please review the following plan we discussed and let me know if I can assist you in the future.   Screening recommendations/referrals: Colonoscopy: No longer required.  Mammogram: No longer required.  Bone Density: Up to date, due 01/2020 Recommended yearly ophthalmology/optometry visit for glaucoma screening and checkup Recommended yearly dental visit for hygiene and checkup  Vaccinations: Influenza vaccine: Pt declines today.  Pneumococcal vaccine: Completed series Tdap vaccine: Pt declines today.  Shingles vaccine: Pt declines today.     Advanced directives: Please bring a copy of your POA (Power of Attorney) and/or Living Will to your next appointment.   Conditions/risks identified: Recommend to drink at least 6-8 8oz glasses of water per day.  Next appointment: 2:00 PM today with Gina Middleton.    Preventive Care 71 Years and Older, Female Preventive care refers to lifestyle choices and visits with your health care provider that can promote health and wellness. What does preventive care include?  A yearly physical exam. This is also called an annual well check.  Dental exams once or twice a year.  Routine eye exams. Ask your health care provider how often you should have your eyes checked.  Personal lifestyle choices, including:  Daily care of your teeth and gums.  Regular physical activity.  Eating a healthy diet.  Avoiding tobacco and drug use.  Limiting alcohol use.  Practicing safe sex.  Taking low-dose aspirin every day.  Taking vitamin and mineral supplements as recommended by your health care provider. What happens during an annual well check? The services and screenings done by your health care provider during your annual well check will depend on your age, overall health, lifestyle risk factors, and family  history of disease. Counseling  Your health care provider may ask you questions about your:  Alcohol use.  Tobacco use.  Drug use.  Emotional well-being.  Home and relationship well-being.  Sexual activity.  Eating habits.  History of falls.  Memory and ability to understand (cognition).  Work and work Statistician.  Reproductive health. Screening  You may have the following tests or measurements:  Height, weight, and BMI.  Blood pressure.  Lipid and cholesterol levels. These may be checked every 5 years, or more frequently if you are over 43 years old.  Skin check.  Lung cancer screening. You may have this screening every year starting at age 45 if you have a 30-pack-year history of smoking and currently smoke or have quit within the past 15 years.  Fecal occult blood test (FOBT) of the stool. You may have this test every year starting at age 29.  Flexible sigmoidoscopy or colonoscopy. You may have a sigmoidoscopy every 5 years or a colonoscopy every 10 years starting at age 57.  Hepatitis C blood test.  Hepatitis B blood test.  Sexually transmitted disease (STD) testing.  Diabetes screening. This is done by checking your blood sugar (glucose) after you have not eaten for a while (fasting). You may have this done every 1-3 years.  Bone density scan. This is done to screen for osteoporosis. You may have this done starting at age 70.  Mammogram. This may be done every 1-2 years. Talk to your health care provider about how often you should have regular mammograms. Talk with your health care provider about your test results, treatment options, and if necessary, the need for more tests. Vaccines  Your health care provider may recommend certain vaccines, such as:  Influenza vaccine. This is recommended every year.  Tetanus, diphtheria, and acellular pertussis (Tdap, Td) vaccine. You may need a Td booster every 10 years.  Zoster vaccine. You may need this after  age 66.  Pneumococcal 13-valent conjugate (PCV13) vaccine. One dose is recommended after age 14.  Pneumococcal polysaccharide (PPSV23) vaccine. One dose is recommended after age 74. Talk to your health care provider about which screenings and vaccines you need and how often you need them. This information is not intended to replace advice given to you by your health care provider. Make sure you discuss any questions you have with your health care provider. Document Released: 03/03/2015 Document Revised: 10/25/2015 Document Reviewed: 12/06/2014 Elsevier Interactive Patient Education  2017 [employer] Prevention in the Home Falls can cause injuries. They can happen to people of all ages. There are many things you can do to make your home safe and to help prevent falls. What can I do on the outside of my home?  Regularly fix the edges of walkways and driveways and fix any cracks.  Remove anything that might make you trip as you walk through a door, such as a raised step or threshold.  Trim any bushes or trees on the path to your home.  Use bright outdoor lighting.  Clear any walking paths of anything that might make someone trip, such as rocks or tools.  Regularly check to see if handrails are loose or broken. Make sure that both sides of any steps have handrails.  Any raised decks and porches should have guardrails on the edges.  Have any leaves, snow, or ice cleared regularly.  Use sand or salt on walking paths during winter.  Clean up any spills in your garage right away. This includes oil or grease spills. What can I do in the bathroom?  Use night lights.  Install grab bars by the toilet and in the tub and shower. Do not use towel bars as grab bars.  Use non-skid mats or decals in the tub or shower.  If you need to sit down in the shower, use a plastic, non-slip stool.  Keep the floor dry. Clean up any water that spills on the floor as soon as it  happens.  Remove soap buildup in the tub or shower regularly.  Attach bath mats securely with double-sided non-slip rug tape.  Do not have throw rugs and other things on the floor that can make you trip. What can I do in the bedroom?  Use night lights.  Make sure that you have a light by your bed that is easy to reach.  Do not use any sheets or blankets that are too big for your bed. They should not hang down onto the floor.  Have a firm chair that has side arms. You can use this for support while you get dressed.  Do not have throw rugs and other things on the floor that can make you trip. What can I do in the kitchen?  Clean up any spills right away.  Avoid walking on wet floors.  Keep items that you use a lot in easy-to-reach places.  If you need to reach something above you, use a strong step stool that has a grab bar.  Keep electrical cords out of the way.  Do not use floor polish or wax that makes floors slippery. If you must use wax, use non-skid floor wax.  Do  not have throw rugs and other things on the floor that can make you trip. What can I do with my stairs?  Do not leave any items on the stairs.  Make sure that there are handrails on both sides of the stairs and use them. Fix handrails that are broken or loose. Make sure that handrails are as long as the stairways.  Check any carpeting to make sure that it is firmly attached to the stairs. Fix any carpet that is loose or worn.  Avoid having throw rugs at the top or bottom of the stairs. If you do have throw rugs, attach them to the floor with carpet tape.  Make sure that you have a light switch at the top of the stairs and the bottom of the stairs. If you do not have them, ask someone to add them for you. What else can I do to help prevent falls?  Wear shoes that:  Do not have high heels.  Have rubber bottoms.  Are comfortable and fit you well.  Are closed at the toe. Do not wear sandals.  If you  use a stepladder:  Make sure that it is fully opened. Do not climb a closed stepladder.  Make sure that both sides of the stepladder are locked into place.  Ask someone to hold it for you, if possible.  Clearly mark and make sure that you can see:  Any grab bars or handrails.  First and last steps.  Where the edge of each step is.  Use tools that help you move around (mobility aids) if they are needed. These include:  Canes.  Walkers.  Scooters.  Crutches.  Turn on the lights when you go into a dark area. Replace any light bulbs as soon as they burn out.  Set up your furniture so you have a clear path. Avoid moving your furniture around.  If any of your floors are uneven, fix them.  If there are any pets around you, be aware of where they are.  Review your medicines with your doctor. Some medicines can make you feel dizzy. This can increase your chance of falling. Ask your doctor what other things that you can do to help prevent falls. This information is not intended to replace advice given to you by your health care provider. Make sure you discuss any questions you have with your health care provider. Document Released: 12/01/2008 Document Revised: 07/13/2015 Document Reviewed: 03/11/2014 Elsevier Interactive Patient Education  2017 [employer].

## 2018-02-24 NOTE — Progress Notes (Signed)
Subjective:   Gina Middleton is a 83 y.o. female who presents for Medicare Annual (Subsequent) preventive examination.  Review of Systems:  N/A  Cardiac Risk Factors include: advanced age (>87men, >59 women);hypertension     Objective:     Vitals: BP (!) 160/84 (BP Location: Right Arm)   Pulse 70   Temp 98.7 F (37.1 C) (Oral)   Ht 5\' 3"  (1.6 m)   Wt 123 lb 9.6 oz (56.1 kg)   BMI 21.89 kg/m   Body mass index is 21.89 kg/m.  Advanced Directives 02/24/2018 04/01/2016 02/08/2016 05/12/2015 11/04/2014 09/20/2014  Does Patient Have a Medical Advance Directive? Yes No No No Yes Yes  Type of Paramedic of [place];Living will - - - Living will Living will  Copy of [place] in Chart? No - copy requested - - - - -    Tobacco Social History   Tobacco Use  Smoking Status Never Smoker  Smokeless Tobacco Never Used     Counseling given: Not Answered   Clinical Intake:  Pre-visit preparation completed: Yes  Pain : No/denies pain Pain Score: 0-No pain     Nutritional Status: BMI of 19-24  Normal Nutritional Risks: None Diabetes: No  How often do you need to have someone help you when you read instructions, pamphlets, or other written materials from your doctor or pharmacy?: 1 - Never  Interpreter Needed?: No  Information entered by :: [facility] LPN  Past Medical History:  Diagnosis Date  . Hypertension   . Renal disorder    Past Surgical History:  Procedure Laterality Date  . APPENDECTOMY    . [place]  . CHOLECYSTECTOMY     Family History  Problem Relation Age of Onset  . Hypertension Mother   . Alzheimer's disease Mother   . Heart attack Father        x's 3  . Diabetes Sister   . Heart attack Sister   . Breast cancer Maternal Grandmother    Social History   Socioeconomic History  . Marital status: Married    Spouse name: Kyung Rudd  . Number of children: 0  . Years of education: College  .  Highest education level: Master's degree (e.g., MA, MS, MEng, MEd, MSW, MBA)  Occupational History  . Occupation: Retired Programmer, multimedia  . Occupation: Works part-time at [employer] and [employer]  . Occupation: [employer]    Employer: [employer]    Comment: Part Time  Social Needs  . Financial resource strain: Not hard at all  . Food insecurity:    Worry: Never true    Inability: Never true  . Transportation needs:    Medical: No    Non-medical: No  Tobacco Use  . Smoking status: Never Smoker  . Smokeless tobacco: Never Used  Substance and Sexual Activity  . Alcohol use: No  . Drug use: No  . Sexual activity: Never  Lifestyle  . Physical activity:    Days per week: 0 days    Minutes per session: 0 min  . Stress: Not at all  Relationships  . Social connections:    Talks on phone: Patient refused    Gets together: Patient refused    Attends religious service: Patient refused    Active member of club or organization: Patient refused    Attends meetings of clubs or organizations: Patient refused    Relationship status: Patient refused  Other Topics Concern  . Not  on file  Social History Narrative  . Not on file    Outpatient Encounter Medications as of 02/24/2018  Medication Sig  . amLODipine (NORVASC) 5 MG tablet Take 1 tablet (5 mg total) by mouth daily.  Marland Kitchen aspirin 81 MG tablet Take 81 mg by mouth daily.   . Biotin 1000 MCG tablet Take 1,000 mcg by mouth daily.  . Cholecalciferol (VITAMIN D3) 25 MCG (1000 UT) CAPS Take by mouth daily.  Marland Kitchen CRANBERRY PO Take by mouth daily. Skips occasionally  . Cyanocobalamin 1000 MCG TBCR Take by mouth daily.   Marland Kitchen ibuprofen (ADVIL,MOTRIN) 200 MG tablet Take 100 mg by mouth every 6 (six) hours as needed.   . Omega-3 Fatty Acids (FISH OIL) 1000 MG CPDR Take by mouth.   No facility-administered encounter medications on file as of 02/24/2018.     Activities of Daily Living In your present state of health, do you have any  difficulty performing the following activities: 02/24/2018  Hearing? N  Vision? N  Difficulty concentrating or making decisions? N  Walking or climbing stairs? Y  Comment Due to right hip pain.   Dressing or bathing? N  Doing errands, shopping? N  Preparing Food and eating ? N  Using the Toilet? N  In the past six months, have you accidently leaked urine? N  Do you have problems with loss of bowel control? N  Managing your Medications? N  Managing your Finances? N  Housekeeping or managing your Housekeeping? N  Some recent data might be hidden    Patient Care Team: [name] as PCP - General (Family Medicine) Ralene Bathe, MD (Dermatology)    Assessment:   This is a routine wellness examination for [name].  Exercise Activities and Dietary recommendations Current Exercise Habits: Home exercise routine, Type of exercise: walking, Time (Minutes): 20, Frequency (Times/Week): 5, Weekly Exercise (Minutes/Week): 100, Intensity: Mild, Exercise limited by: orthopedic condition(s)  Goals    . DIET - INCREASE WATER INTAKE     Recommend to drink at least 6-8 8oz glasses of water per day.       Fall Risk Fall Risk  02/24/2018 02/12/2017 08/28/2016 02/08/2016 01/25/2015  Falls in the past year? 0 No No No No  Number falls in past yr: 0 - - - -  Injury with Fall? 0 - - - -   FALL RISK PREVENTION PERTAINING TO THE HOME:  Any stairs in or around the home WITH handrails? Yes  Home free of loose throw rugs in walkways, pet beds, electrical cords, etc? Yes  Adequate lighting in your home to reduce risk of falls? Yes   ASSISTIVE DEVICES UTILIZED TO PREVENT FALLS:  Life alert? No  Use of a cane, walker or w/c? No  Grab bars in the bathroom? No  Shower chair or bench in shower? Yes  Elevated toilet seat or a handicapped toilet? No    TIMED UP AND GO:  Was the test performed? No .     Depression Screen PHQ 2/9 Scores 02/24/2018 02/24/2018 02/12/2017 08/28/2016  PHQ -  2 Score 0 0 0 0  PHQ- 9 Score 0 - - -     Cognitive Function     6CIT Screen 02/24/2018  What Year? 0 points  What month? 0 points  What time? 0 points  Count back from 20 0 points  Months in reverse 0 points  Repeat phrase 0 points  Total Score 0    Immunization History  Administered Date(s)  Administered  . Pneumococcal Conjugate-13 08/28/2016  . Pneumococcal Polysaccharide-23 09/12/2017  . Td 05/10/1995    Qualifies for Shingles Vaccine? Yes . Due for Shingrix. Education has been provided regarding the importance of this vaccine. Pt has been advised to call insurance company to determine out of pocket expense. Advised may also receive vaccine at local pharmacy or Health Dept. Verbalized acceptance and understanding.  Tdap: Although this vaccine is not a covered service during a Wellness Exam, does the patient still wish to receive this vaccine today?  No .  Education has been provided regarding the importance of this vaccine. Advised may receive this vaccine at local pharmacy or Health Dept. Aware to provide a copy of the vaccination record if obtained from local pharmacy or Health Dept. Verbalized acceptance and understanding.  Flu Vaccine: Due for Flu vaccine. Does the patient want to receive this vaccine today?  No . Education has been provided regarding the importance of this vaccine but still declined. Advised may receive this vaccine at local pharmacy or Health Dept. Aware to provide a copy of the vaccination record if obtained from local pharmacy or Health Dept. Verbalized acceptance and understanding.  Pneumococcal Vaccine: Up to date   Screening Tests Health Maintenance  Topic Date Due  . TETANUS/TDAP  05/09/2005  . INFLUENZA VACCINE  05/19/2018 (Originally 09/18/2017)  . DEXA SCAN  02/08/2020  . PNA vac Low Risk Adult  Completed    Cancer Screenings:  Colorectal Screening: No longer required.   Mammogram: No longer required.   Bone Density: Completed 02/08/15.  Results reflect OSTEOPENIA. Repeat every 5 years.   Lung Cancer Screening: (Low Dose CT Chest recommended if Age 43-80 years, 30 pack-year currently smoking OR have quit w/in 15years.) does not qualify.    Additional Screening:  Vision Screening: Recommended annual ophthalmology exams for early detection of glaucoma and other disorders of the eye.  Dental Screening: Recommended annual dental exams for proper oral hygiene  Community Resource Referral:  CRR required this visit?  No       Plan:  I have personally reviewed and addressed the Medicare Annual Wellness questionnaire and have noted the following in the patient's chart:  A. Medical and social history B. Use of alcohol, tobacco or illicit drugs  C. Current medications and supplements D. Functional ability and status E.  Nutritional status F.  Physical activity G. Advance directives H. List of other physicians I.  Hospitalizations, surgeries, and ER visits in previous 12 months J.  [place] such as hearing and vision if needed, cognitive and depression L. Referrals and appointments - none  In addition, I have reviewed and discussed with patient certain preventive protocols, quality metrics, and best practice recommendations. A written personalized care plan for preventive services as well as general preventive health recommendations were provided to patient.  See attached scanned questionnaire for additional information.   Signed,  Fabio Neighbors, LPN Nurse Health Advisor   Nurse Recommendations: Pt declined the tetanus and influenza vaccines today.

## 2018-02-28 LAB — COMPREHENSIVE METABOLIC PANEL
A/G RATIO: 1.7 (ref 1.2–2.2)
ALT: 11 IU/L (ref 0–32)
AST: 18 IU/L (ref 0–40)
Albumin: 4.4 g/dL (ref 3.5–4.7)
Alkaline Phosphatase: 111 IU/L (ref 39–117)
BILIRUBIN TOTAL: 0.6 mg/dL (ref 0.0–1.2)
BUN / CREAT RATIO: 13 (ref 12–28)
BUN: 11 mg/dL (ref 8–27)
CO2: 24 mmol/L (ref 20–29)
CREATININE: 0.84 mg/dL (ref 0.57–1.00)
Calcium: 9.3 mg/dL (ref 8.7–10.3)
Chloride: 104 mmol/L (ref 96–106)
GFR, EST AFRICAN AMERICAN: 76 mL/min/{1.73_m2} (ref >59)
GFR, EST NON AFRICAN AMERICAN: 66 mL/min/{1.73_m2} (ref >59)
Globulin, Total: 2.6 g/dL (ref 1.5–4.5)
Glucose: 98 mg/dL (ref 65–99)
Potassium: 4.1 mmol/L (ref 3.5–5.2)
SODIUM: 143 mmol/L (ref 134–144)
Total Protein: 7 g/dL (ref 6.0–8.5)

## 2018-02-28 LAB — HEMOGLOBIN A1C
ESTIMATED AVERAGE GLUCOSE: 108 mg/dL
HEMOGLOBIN A1C: 5.4 % (ref 4.8–5.6)

## 2018-02-28 LAB — CBC WITH DIFFERENTIAL/PLATELET
BASOS ABS: 0.1 10*3/uL (ref 0.0–0.2)
BASOS: 1 %
EOS (ABSOLUTE): 0.2 10*3/uL (ref 0.0–0.4)
Eos: 4 %
Hematocrit: 37.5 % (ref 34.0–46.6)
Hemoglobin: 12.7 g/dL (ref 11.1–15.9)
Immature Grans (Abs): 0 10*3/uL (ref 0.0–0.1)
Immature Granulocytes: 0 %
Lymphocytes Absolute: 1.3 10*3/uL (ref 0.7–3.1)
Lymphs: 25 %
MCH: 30.6 pg (ref 26.6–33.0)
MCHC: 33.9 g/dL (ref 31.5–35.7)
MCV: 90 fL (ref 79–97)
Monocytes Absolute: 0.4 10*3/uL (ref 0.1–0.9)
Monocytes: 9 %
NEUTROS ABS: 3.2 10*3/uL (ref 1.4–7.0)
NEUTROS PCT: 61 %
PLATELETS: 227 10*3/uL (ref 150–450)
RBC: 4.15 x10E6/uL (ref 3.77–5.28)
RDW: 12.6 % (ref 11.7–15.4)
WBC: 5.1 10*3/uL (ref 3.4–10.8)

## 2018-02-28 LAB — TSH: TSH: 1.72 u[IU]/mL (ref 0.450–4.500)

## 2018-02-28 LAB — LIPID PANEL
Chol/HDL Ratio: 3.6 ratio (ref 0.0–4.4)
Cholesterol, Total: 150 mg/dL (ref 100–199)
HDL: 42 mg/dL (ref >39)
LDL CALC: 83 mg/dL (ref 0–99)
Triglycerides: 126 mg/dL (ref 0–149)
VLDL Cholesterol Cal: 25 mg/dL (ref 5–40)

## 2018-02-28 LAB — VITAMIN D 25 HYDROXY (VIT D DEFICIENCY, FRACTURES): Vit D, 25-Hydroxy: 28.6 ng/mL — ABNORMAL LOW (ref 30.0–100.0)

## 2018-03-02 ENCOUNTER — Telehealth: Payer: Self-pay | Admitting: *Deleted

## 2018-03-02 NOTE — Telephone Encounter (Signed)
LMOVM for pt to return call 

## 2018-03-02 NOTE — Telephone Encounter (Signed)
-----   Message from [name], [place] sent at 03/02/2018  7:51 AM EST ----- All labs are normal except Vit D which is borderline low. Improved from last year however. If she is taking 1000 IU daily she should increase to 2000 IU daily.

## 2018-03-02 NOTE — Telephone Encounter (Signed)
Pt returned missed call. Please call pt back.  If you cannot reach her please leave a detailed message  Thanks, [facility]

## 2018-03-02 NOTE — Telephone Encounter (Signed)
Patient was notified of results. Expressed understanding.  

## 2018-04-09 ENCOUNTER — Emergency Department: Payer: Medicare Other

## 2018-04-09 ENCOUNTER — Encounter: Payer: Self-pay | Admitting: Emergency Medicine

## 2018-04-09 ENCOUNTER — Other Ambulatory Visit: Payer: Self-pay

## 2018-04-09 ENCOUNTER — Inpatient Hospital Stay: Payer: Medicare Other | Admitting: Anesthesiology

## 2018-04-09 ENCOUNTER — Encounter
Admission: EM | Disposition: A | Discharge status: Home / Self Care | Payer: Self-pay | Source: Home / Self Care | Attending: Internal Medicine

## 2018-04-09 ENCOUNTER — Inpatient Hospital Stay
Admission: EM | Admit: 2018-04-09 | Discharge: 2018-04-12 | DRG: 661 | Disposition: A | Discharge status: Home / Self Care | Payer: Medicare Other | Attending: Internal Medicine | Admitting: Internal Medicine

## 2018-04-09 DIAGNOSIS — B9620 Unspecified Escherichia coli [E. coli] as the cause of diseases classified elsewhere: Secondary | ICD-10-CM | POA: Diagnosis present

## 2018-04-09 DIAGNOSIS — Z79899 Other long term (current) drug therapy: Secondary | ICD-10-CM

## 2018-04-09 DIAGNOSIS — N390 Urinary tract infection, site not specified: Secondary | ICD-10-CM

## 2018-04-09 DIAGNOSIS — N201 Calculus of ureter: Secondary | ICD-10-CM | POA: Diagnosis not present

## 2018-04-09 DIAGNOSIS — N23 Unspecified renal colic: Secondary | ICD-10-CM

## 2018-04-09 DIAGNOSIS — N200 Calculus of kidney: Secondary | ICD-10-CM

## 2018-04-09 DIAGNOSIS — Z7982 Long term (current) use of aspirin: Secondary | ICD-10-CM | POA: Diagnosis not present

## 2018-04-09 DIAGNOSIS — I10 Essential (primary) hypertension: Secondary | ICD-10-CM | POA: Diagnosis present

## 2018-04-09 DIAGNOSIS — R319 Hematuria, unspecified: Secondary | ICD-10-CM

## 2018-04-09 DIAGNOSIS — N136 Pyonephrosis: Principal | ICD-10-CM | POA: Diagnosis present

## 2018-04-09 HISTORY — PX: CYSTOSCOPY W/ RETROGRADES: SHX1426

## 2018-04-09 HISTORY — PX: CYSTOSCOPY WITH STENT PLACEMENT: SHX5790

## 2018-04-09 HISTORY — PX: CYSTOSCOPY WITH URETEROSCOPY: SHX5123

## 2018-04-09 LAB — COMPREHENSIVE METABOLIC PANEL
ALBUMIN: 4.2 g/dL (ref 3.5–5.0)
ALK PHOS: 95 U/L (ref 38–126)
ALT: 12 U/L (ref 0–44)
ANION GAP: 8 (ref 5–15)
AST: 20 U/L (ref 15–41)
BILIRUBIN TOTAL: 1.4 mg/dL — AB (ref 0.3–1.2)
BUN: 12 mg/dL (ref 8–23)
CO2: 25 mmol/L (ref 22–32)
Calcium: 9.1 mg/dL (ref 8.9–10.3)
Chloride: 106 mmol/L (ref 98–111)
Creatinine, Ser: 0.65 mg/dL (ref 0.44–1.00)
GFR calc Af Amer: >60 mL/min (ref >60)
GFR calc non Af Amer: >60 mL/min (ref >60)
GLUCOSE: 130 mg/dL — AB (ref 70–99)
POTASSIUM: 3.9 mmol/L (ref 3.5–5.1)
Sodium: 139 mmol/L (ref 135–145)
Total Protein: 7.7 g/dL (ref 6.5–8.1)

## 2018-04-09 LAB — CBC
HCT: 35.2 % — ABNORMAL LOW (ref 36.0–46.0)
Hemoglobin: 11.8 g/dL — ABNORMAL LOW (ref 12.0–15.0)
MCH: 30.4 pg (ref 26.0–34.0)
MCHC: 33.5 g/dL (ref 30.0–36.0)
MCV: 90.7 fL (ref 80.0–100.0)
Platelets: 197 10*3/uL (ref 150–400)
RBC: 3.88 MIL/uL (ref 3.87–5.11)
RDW: 12.5 % (ref 11.5–15.5)
WBC: 9.9 10*3/uL (ref 4.0–10.5)
nRBC: 0 % (ref 0.0–0.2)

## 2018-04-09 LAB — URINALYSIS, COMPLETE (UACMP) WITH MICROSCOPIC
Bilirubin Urine: NEGATIVE
GLUCOSE, UA: NEGATIVE mg/dL
Ketones, ur: NEGATIVE mg/dL
NITRITE: NEGATIVE
Protein, ur: 30 mg/dL — AB
RBC / HPF: >50 RBC/hpf — ABNORMAL HIGH (ref 0–5)
SPECIFIC GRAVITY, URINE: 1.004 — AB (ref 1.005–1.030)
Squamous Epithelial / LPF: NONE SEEN (ref 0–5)
pH: 7 (ref 5.0–8.0)

## 2018-04-09 LAB — CBC WITH DIFFERENTIAL/PLATELET
ABS IMMATURE GRANULOCYTES: 0.03 10*3/uL (ref 0.00–0.07)
BASOS ABS: 0.1 10*3/uL (ref 0.0–0.1)
Basophils Relative: 0 %
Eosinophils Absolute: 0.1 10*3/uL (ref 0.0–0.5)
Eosinophils Relative: 1 %
HEMATOCRIT: 38 % (ref 36.0–46.0)
HEMOGLOBIN: 12.5 g/dL (ref 12.0–15.0)
IMMATURE GRANULOCYTES: 0 %
LYMPHS ABS: 1.3 10*3/uL (ref 0.7–4.0)
LYMPHS PCT: 12 %
MCH: 30.3 pg (ref 26.0–34.0)
MCHC: 32.9 g/dL (ref 30.0–36.0)
MCV: 92.2 fL (ref 80.0–100.0)
Monocytes Absolute: 0.8 10*3/uL (ref 0.1–1.0)
Monocytes Relative: 7 %
NEUTROS ABS: 9 10*3/uL — AB (ref 1.7–7.7)
NEUTROS PCT: 80 %
NRBC: 0 % (ref 0.0–0.2)
Platelets: 215 10*3/uL (ref 150–400)
RBC: 4.12 MIL/uL (ref 3.87–5.11)
RDW: 12.5 % (ref 11.5–15.5)
WBC: 11.3 10*3/uL — ABNORMAL HIGH (ref 4.0–10.5)

## 2018-04-09 LAB — CREATININE, SERUM
Creatinine, Ser: 0.62 mg/dL (ref 0.44–1.00)
GFR calc Af Amer: >60 mL/min (ref >60)
GFR calc non Af Amer: >60 mL/min (ref >60)

## 2018-04-09 SURGERY — CYSTOSCOPY, WITH STENT INSERTION
Anesthesia: General | Site: Ureter | Laterality: Left

## 2018-04-09 MED ORDER — FENTANYL CITRATE (PF) 100 MCG/2ML IJ SOLN
INTRAMUSCULAR | Status: DC | PRN
  Administered 2018-04-09: 25 ug via INTRAVENOUS
  Administered 2018-04-09: 50 ug via INTRAVENOUS
  Administered 2018-04-09: 25 ug via INTRAVENOUS

## 2018-04-09 MED ORDER — ONDANSETRON HCL 4 MG/2ML IJ SOLN
INTRAMUSCULAR | Status: AC
  Filled 2018-04-09: qty 2

## 2018-04-09 MED ORDER — PROPOFOL 10 MG/ML IV BOLUS
INTRAVENOUS | Status: AC
  Filled 2018-04-09: qty 20

## 2018-04-09 MED ORDER — ONDANSETRON HCL 4 MG PO TABS: 4.0000 mg | ORAL_TABLET | Freq: Four times a day (QID) | ORAL | Status: DC | PRN

## 2018-04-09 MED ORDER — FENTANYL CITRATE (PF) 100 MCG/2ML IJ SOLN: 25.0000 ug | INTRAMUSCULAR | Status: DC | PRN

## 2018-04-09 MED ORDER — AMLODIPINE BESYLATE 5 MG PO TABS
5.0000 mg | ORAL_TABLET | Freq: Every day | ORAL | Status: DC
  Administered 2018-04-10 – 2018-04-12 (×3): 5 mg via ORAL
  Filled 2018-04-09 (×4): qty 1

## 2018-04-09 MED ORDER — VITAMIN D 25 MCG (1000 UNIT) PO TABS
1000.0000 [IU] | ORAL_TABLET | Freq: Every day | ORAL | Status: DC
  Administered 2018-04-09 – 2018-04-12 (×4): 1000 [IU] via ORAL
  Filled 2018-04-09 (×4): qty 1

## 2018-04-09 MED ORDER — ASPIRIN EC 81 MG PO TBEC
81.0000 mg | DELAYED_RELEASE_TABLET | Freq: Every day | ORAL | Status: DC
  Administered 2018-04-10 – 2018-04-12 (×3): 81 mg via ORAL
  Filled 2018-04-09 (×4): qty 1

## 2018-04-09 MED ORDER — ONDANSETRON HCL 4 MG/2ML IJ SOLN
4.0000 mg | Freq: Four times a day (QID) | INTRAMUSCULAR | Status: DC | PRN
  Administered 2018-04-09 – 2018-04-10 (×2): 4 mg via INTRAVENOUS
  Filled 2018-04-09: qty 2

## 2018-04-09 MED ORDER — LIDOCAINE HCL (CARDIAC) PF 100 MG/5ML IV SOSY
PREFILLED_SYRINGE | INTRAVENOUS | Status: DC | PRN
  Administered 2018-04-09: 60 mg via INTRAVENOUS

## 2018-04-09 MED ORDER — BISACODYL 10 MG RE SUPP
10.0000 mg | Freq: Every day | RECTAL | Status: DC | PRN
  Filled 2018-04-09: qty 1

## 2018-04-09 MED ORDER — SODIUM CHLORIDE 0.9 % IV SOLN
1.0000 g | INTRAVENOUS | Status: DC
  Administered 2018-04-10 – 2018-04-12 (×3): 1 g via INTRAVENOUS
  Filled 2018-04-09: qty 1
  Filled 2018-04-09: qty 10
  Filled 2018-04-09 (×2): qty 1

## 2018-04-09 MED ORDER — DEXAMETHASONE SODIUM PHOSPHATE 10 MG/ML IJ SOLN
INTRAMUSCULAR | Status: DC | PRN
  Administered 2018-04-09: 4 mg via INTRAVENOUS

## 2018-04-09 MED ORDER — IOPAMIDOL (ISOVUE-200) INJECTION 41%
INTRAVENOUS | Status: DC | PRN
  Administered 2018-04-09: 20 mL via INTRAVENOUS

## 2018-04-09 MED ORDER — SODIUM CHLORIDE 0.9 % IV BOLUS
500.0000 mL | Freq: Once | INTRAVENOUS | Status: AC
  Administered 2018-04-09: 500 mL via INTRAVENOUS

## 2018-04-09 MED ORDER — VITAMIN B-12 1000 MCG PO TABS
1000.0000 ug | ORAL_TABLET | Freq: Every day | ORAL | Status: DC
  Administered 2018-04-09 – 2018-04-12 (×3): 1000 ug via ORAL
  Filled 2018-04-09 (×5): qty 1

## 2018-04-09 MED ORDER — BIOTIN 1000 MCG PO TABS: 1000.0000 ug | ORAL_TABLET | Freq: Every day | ORAL | Status: DC

## 2018-04-09 MED ORDER — ACETAMINOPHEN 325 MG PO TABS
650.0000 mg | ORAL_TABLET | Freq: Four times a day (QID) | ORAL | Status: DC | PRN
  Administered 2018-04-10 – 2018-04-11 (×3): 650 mg via ORAL
  Filled 2018-04-09 (×3): qty 2

## 2018-04-09 MED ORDER — PHENYLEPHRINE HCL 10 MG/ML IJ SOLN
INTRAMUSCULAR | Status: DC | PRN
  Administered 2018-04-09: 100 ug via INTRAVENOUS

## 2018-04-09 MED ORDER — SODIUM CHLORIDE 0.9 % IV SOLN
INTRAVENOUS | Status: DC
  Administered 2018-04-09 – 2018-04-10 (×5): via INTRAVENOUS

## 2018-04-09 MED ORDER — ENOXAPARIN SODIUM 40 MG/0.4ML ~~LOC~~ SOLN
40.0000 mg | SUBCUTANEOUS | Status: DC
  Filled 2018-04-09: qty 0.4

## 2018-04-09 MED ORDER — ACETAMINOPHEN 650 MG RE SUPP: 650.0000 mg | Freq: Four times a day (QID) | RECTAL | Status: DC | PRN

## 2018-04-09 MED ORDER — POLYETHYLENE GLYCOL 3350 17 G PO PACK: 17.0000 g | PACK | Freq: Every day | ORAL | Status: DC | PRN

## 2018-04-09 MED ORDER — FENTANYL CITRATE (PF) 100 MCG/2ML IJ SOLN
INTRAMUSCULAR | Status: AC
  Filled 2018-04-09: qty 2

## 2018-04-09 MED ORDER — DEXAMETHASONE SODIUM PHOSPHATE 4 MG/ML IJ SOLN
INTRAMUSCULAR | Status: AC
  Filled 2018-04-09: qty 1

## 2018-04-09 MED ORDER — PROPOFOL 10 MG/ML IV BOLUS
INTRAVENOUS | Status: DC | PRN
  Administered 2018-04-09: 100 mg via INTRAVENOUS

## 2018-04-09 MED ORDER — MORPHINE SULFATE (PF) 2 MG/ML IV SOLN
2.0000 mg | INTRAVENOUS | Status: DC | PRN
  Filled 2018-04-09: qty 1

## 2018-04-09 MED ORDER — SODIUM CHLORIDE 0.9 % IV SOLN
1.0000 g | Freq: Once | INTRAVENOUS | Status: AC
  Administered 2018-04-09: 1 g via INTRAVENOUS
  Filled 2018-04-09: qty 10

## 2018-04-09 MED ORDER — LIDOCAINE HCL (PF) 2 % IJ SOLN
INTRAMUSCULAR | Status: AC
  Filled 2018-04-09: qty 10

## 2018-04-09 SURGICAL SUPPLY — 31 items
BAG DRAIN CYSTO-URO LG1000N (MISCELLANEOUS) ×3 IMPLANT
BASKET ZERO TIP 1.9FR (BASKET) ×2 IMPLANT
BRUSH SCRUB EZ  4% CHG (MISCELLANEOUS)
BRUSH SCRUB EZ 1% IODOPHOR (MISCELLANEOUS) ×3 IMPLANT
BRUSH SCRUB EZ 4% CHG (MISCELLANEOUS) IMPLANT
CATH URETL 5X70 OPEN END (CATHETERS) ×3 IMPLANT
CNTNR SPEC 2.5X3XGRAD LEK (MISCELLANEOUS) ×1
CONRAY 43 FOR UROLOGY 50M (MISCELLANEOUS) ×3 IMPLANT
CONT SPEC 4OZ STER OR WHT (MISCELLANEOUS) ×2
CONTAINER SPEC 2.5X3XGRAD LEK (MISCELLANEOUS) ×1 IMPLANT
COVER WAND RF STERILE (DRAPES) ×3 IMPLANT
GLOVE BIO SURGEON STRL SZ8 (GLOVE) ×3 IMPLANT
GOWN STRL REUS W/ TWL LRG LVL3 (GOWN DISPOSABLE) ×2 IMPLANT
GOWN STRL REUS W/ TWL XL LVL3 (GOWN DISPOSABLE) ×1 IMPLANT
GOWN STRL REUS W/TWL LRG LVL3 (GOWN DISPOSABLE) ×4
GOWN STRL REUS W/TWL XL LVL3 (GOWN DISPOSABLE) ×2
GUIDEWIRE GREEN .038 145CM (MISCELLANEOUS) ×3 IMPLANT
GUIDEWIRE STR DUAL SENSOR (WIRE) ×4 IMPLANT
INFUSOR MANOMETER BAG 3000ML (MISCELLANEOUS) ×3 IMPLANT
INTRODUCER DILATOR DOUBLE (INTRODUCER) ×3 IMPLANT
KIT TURNOVER CYSTO (KITS) ×3 IMPLANT
PACK CYSTO AR (MISCELLANEOUS) ×3 IMPLANT
SET CYSTO W/LG BORE CLAMP LF (SET/KITS/TRAYS/PACK) ×3 IMPLANT
SHEATH URETERAL 12FRX35CM (MISCELLANEOUS) ×3 IMPLANT
SOL .9 NS 3000ML IRR  AL (IV SOLUTION) ×2
SOL .9 NS 3000ML IRR UROMATIC (IV SOLUTION) ×1 IMPLANT
STENT URET 6FRX22 CONTOUR (STENTS) ×2 IMPLANT
STENT URET 6FRX24 CONTOUR (STENTS) IMPLANT
STENT URET 6FRX26 CONTOUR (STENTS) IMPLANT
SURGILUBE 2OZ TUBE FLIPTOP (MISCELLANEOUS) ×3 IMPLANT
WATER STERILE IRR 1000ML POUR (IV SOLUTION) ×3 IMPLANT

## 2018-04-09 NOTE — H&P (Signed)
[facility] NAME: Gina Middleton    MR#:  [id number]  DATE OF BIRTH:  08-Aug-1935  DATE OF ADMISSION:  04/09/2018  PRIMARY CARE PHYSICIAN: [name], PA-C   REQUESTING/REFERRING PHYSICIAN: dr Burlene Arnt  CHIEF COMPLAINT:   Flank pain HISTORY OF PRESENT ILLNESS:  Gina Middleton  is a 83 y.o. female with a known history of hypertension who presents today to the emergency room due to left-sided flank pain.  Patient reports that since yesterday she has had left-sided flank pain.  It was initially a 10 out of 10 however her pain has subsided.  She is also complaining of dysuria and subjective fever and chills. Patient reports no history of kidney stones. CT scan performed in the emergency room today shows 3 mm distal left ureteral calculus with mild hydronephrosis.  ER physician has spoken with Dr. Bernardo Heater who will review CT scan and give recommendations after that.  She has been started on antibiotics for dysuria.  She denies hematuria  PAST MEDICAL HISTORY:   Past Medical History:  Diagnosis Date  . Hypertension   . Renal disorder     PAST SURGICAL HISTORY:   Past Surgical History:  Procedure Laterality Date  . APPENDECTOMY    . [place]  . CHOLECYSTECTOMY      SOCIAL HISTORY:   Social History   Tobacco Use  . Smoking status: Never Smoker  . Smokeless tobacco: Never Used  Substance Use Topics  . Alcohol use: No    FAMILY HISTORY:   Family History  Problem Relation Age of Onset  . Hypertension Mother   . Alzheimer's disease Mother   . Heart attack Father        x's 3  . Diabetes Sister   . Heart attack Sister   . Breast cancer Maternal Grandmother     DRUG ALLERGIES:   Allergies  Allergen Reactions  . Other Anaphylaxis    A muscle relaxer in the 70s but cannot remember name of the medication that caused it  . Hydrocodone-Acetaminophen Other (See Comments)    dizziness  . Iodinated  Diagnostic Agents   . Oxycodone-Acetaminophen     REVIEW OF SYSTEMS:   Review of Systems  Constitutional: Negative.  Negative for chills, fever and malaise/fatigue.  HENT: Negative.  Negative for ear discharge, ear pain, hearing loss, nosebleeds and sore throat.   Eyes: Negative.  Negative for blurred vision and pain.  Respiratory: Negative.  Negative for cough, hemoptysis, shortness of breath and wheezing.   Cardiovascular: Negative.  Negative for chest pain, palpitations and leg swelling.  Gastrointestinal: Negative.  Negative for abdominal pain, blood in stool, diarrhea, nausea and vomiting.  Genitourinary: Positive for dysuria, flank pain and frequency.  Musculoskeletal: Negative for back pain.  Skin: Negative.   Neurological: Negative for dizziness, tremors, speech change, focal weakness, seizures and headaches.  Endo/Heme/Allergies: Negative.  Does not bruise/bleed easily.  Psychiatric/Behavioral: Negative.  Negative for depression, hallucinations and suicidal ideas.    MEDICATIONS AT HOME:   Prior to Admission medications   Medication Sig Start Date End Date Taking? Authorizing Provider  amLODipine (NORVASC) 5 MG tablet Take 1 tablet (5 mg total) by mouth daily. 09/12/17  Yes [name], PA-C  aspirin 81 MG tablet Take 81 mg by mouth daily.  02/16/10  Yes [provider]  Biotin 1000 MCG tablet Take 1,000 mcg by mouth daily.   Yes [provider]  Cholecalciferol (VITAMIN  D3) 25 MCG (1000 UT) CAPS Take 1,000 Units by mouth daily.    Yes [provider]  CRANBERRY PO Take 1 tablet by mouth daily. Skips occasionally   Yes [provider]  Cyanocobalamin 1000 MCG TBCR Take 1,000 mcg by mouth daily.  05/13/11  Yes [provider]  Omega-3 Fatty Acids (FISH OIL) 1000 MG CPDR Take 1,000 mg by mouth daily.  02/16/10  Yes [provider]      VITAL SIGNS:  Blood pressure (!) 144/80, pulse 77, temperature 98.5 F (36.9  C), temperature source Oral, resp. rate 18, height 5\' 3"  (1.6 m), weight 54.9 kg, SpO2 100 %.  PHYSICAL EXAMINATION:   Physical Exam Constitutional:      General: She is not in acute distress. HENT:     Head: Normocephalic.  Eyes:     General: No scleral icterus. Neck:     Musculoskeletal: Normal range of motion and neck supple.     Vascular: No JVD.     Trachea: No tracheal deviation.  Cardiovascular:     Rate and Rhythm: Normal rate and regular rhythm.     Heart sounds: Normal heart sounds. No murmur. No friction rub. No gallop.   Pulmonary:     Effort: Pulmonary effort is normal. No respiratory distress.     Breath sounds: Normal breath sounds. No wheezing or rales.  Chest:     Chest wall: No tenderness.  Abdominal:     General: Bowel sounds are normal. There is no distension.     Palpations: Abdomen is soft. There is no mass.     Tenderness: There is no abdominal tenderness. There is no guarding or rebound.  Musculoskeletal: Normal range of motion.  Skin:    General: Skin is warm.     Findings: No erythema or rash.  Neurological:     Mental Status: She is alert and oriented to person, place, and time.  Psychiatric:        Judgment: Judgment normal.       LABORATORY PANEL:   CBC Recent Labs  Lab 04/09/18 0550  WBC 11.3*  HGB 12.5  HCT 38.0  PLT 215   ------------------------------------------------------------------------------------------------------------------  Chemistries  Recent Labs  Lab 04/09/18 0550  NA 139  K 3.9  CL 106  CO2 25  GLUCOSE 130*  BUN 12  CREATININE 0.65  CALCIUM 9.1  AST 20  ALT 12  ALKPHOS 95  BILITOT 1.4*   ------------------------------------------------------------------------------------------------------------------  Cardiac Enzymes No results for input(s): TROPONINI in the last 168  hours. ------------------------------------------------------------------------------------------------------------------  RADIOLOGY:  Ct Renal Stone Study  Result Date: 04/09/2018 CLINICAL DATA:  Flank pain with stone disease suspected EXAM: CT ABDOMEN AND PELVIS WITHOUT CONTRAST TECHNIQUE: Multidetector CT imaging of the abdomen and pelvis was performed following the standard protocol without IV contrast. COMPARISON:  04/01/2016 FINDINGS: Lower chest: No acute finding. Mild lung scarring along endplate osteophytes in the right lower lobe. Calcified right lower lobe granuloma. Hepatobiliary: Tiny low-density in the upper right liver that is too small for densitometry.Cholecystectomy. Pancreas: Unremarkable. Spleen: Unremarkable. Adrenals/Urinary Tract: Negative adrenals. Mild left hydronephrosis and hydroureter with perinephric edema due to a 3 mm stone along the left pelvic sidewall. Two right renal calculi that measure up to 2 mm. Right upper pole renal cystic density. Tiny presumed hemorrhagic cyst in the lower pole left kidney. Negative bladder. Stomach/Bowel: No obstruction. Appendectomy. Numerous left-sided colonic diverticula Vascular/Lymphatic: Mild atherosclerotic calcification . No mass or adenopathy Reproductive:Normal for age Other: Trace pelvic  fluid, nonspecific. Musculoskeletal: Severe hip osteoarthritis. Spondylosis, disc narrowing, and osteopenia IMPRESSION: 1. 3 mm distal left ureteral calculus with mild hydronephrosis. 2. Two small right renal calculi. Electronically Signed   By: Monte Fantasia M.D.   On: 04/09/2018 07:11    EKG:  No orders found for this or any previous visit.  IMPRESSION AND PLAN:   83 year old female with history of hypertension who presents with left-sided flank pain.  1. 3 mm distal left ureteral calculus with mild hydronephrosis: ER physician is contacted Dr. Bernardo Heater who will review films and advise. IV fluids have been started PRN pain control  2.   Acute cystitis due to calculus: Continue Rocephin and follow-up on final urine culture  3.  Essential hypertension: Continue Norvasc    All the records are reviewed and case discussed with ED provider. Management plans discussed with the patient and she is in agreement  CODE STATUS: full  TOTAL TIME TAKING CARE OF THIS PATIENT: 44 minutes.    Palmer Shorey M.D on 04/09/2018 at 10:29 AM  Between 7am to 6pm - Pager - [phone]  After 6pm go to [email or web address] - password [email or web address] [facility]  Office  [phone]  CC: Primary care physician; [name], PA-C

## 2018-04-09 NOTE — ED Notes (Signed)
Consent filled out for procedure but pt denying anyone has talked with her about it yet. Hold on signature until surgery thoroughly talks through this with pt.

## 2018-04-09 NOTE — Anesthesia Post-op Follow-up Note (Signed)
Anesthesia QCDR form completed.        

## 2018-04-09 NOTE — Anesthesia Postprocedure Evaluation (Signed)
Anesthesia Post Note  Patient: Gina Middleton  Procedure(s) Performed: CYSTOSCOPY WITH STENT PLACEMENT-LEFT (Left Ureter) POSSIBLE URETEROSCOPY-LEFT (Left Ureter) CYSTOSCOPY WITH RETROGRADE PYELOGRAM (Left Ureter)  Patient location during evaluation: PACU Anesthesia Type: General Level of consciousness: awake and alert Pain management: pain level controlled Vital Signs Assessment: post-procedure vital signs reviewed and stable Respiratory status: spontaneous breathing, nonlabored ventilation and respiratory function stable Cardiovascular status: blood pressure returned to baseline and stable Postop Assessment: no signs of nausea or vomiting Anesthetic complications: no     Last Vitals:  Vitals:   04/09/18 1257 04/09/18 1419  BP: 134/71 111/64  Pulse: 75 66  Resp: 18 11  Temp: 36.9 C 36.5 C  SpO2: 100% 100%    Last Pain:  Vitals:   04/09/18 1419  TempSrc:   PainSc: Asleep                 Chanice Brenton

## 2018-04-09 NOTE — Op Note (Signed)
Preoperative diagnosis: Left distal ureteral calculus  Postoperative diagnosis: Left distal ureteral calculus  Procedure:  1. Cystoscopy 2. Left ureteroscopy and stone removal 3. Left ureteral stent placement (6FR) 24 cm 4. Left retrograde pyelography with interpretation  Surgeon: Nicki Reaper C. Patryce Depriest, M.D.  Anesthesia: General  Complications: None  Intraoperative findings:   1.  Left retrograde pyelography post procedure showed no filling defects, stone fragments or contrast extravasation  EBL: Minimal  Specimens: 1. Calculus fragments for analysis   Indication: Gina Middleton is a 83 y.o. year old female who presented to the ED this morning with a 12-hour history of left flank pain which was moderate to severe in intensity.  She had mild dysuria and a subjective fever yesterday.  Urinalysis showed pyuria and microhematuria but was nitrite negative.  She had mild leukocytosis.   After reviewing the management options for treatment, the patient elected to proceed with the above surgical procedure(s). We have discussed the potential benefits and risks of the procedure, side effects of the proposed treatment, the likelihood of the patient achieving the goals of the procedure, and any potential problems that might occur during the procedure or recuperation. Informed consent has been obtained.  Description of procedure:  The patient was taken to the operating room and general anesthesia was induced.  The patient was placed in the dorsal lithotomy position, prepped and draped in the usual sterile fashion, and preoperative antibiotics were administered. A preoperative time-out was performed.   A 21 French cystoscope was lubricated and passed under direct vision.  The urethra was normal and appearance.  Panendoscopy was performed and the bladder mucosa showed scattered erythema endoscopically consistent with cystitis.  No solid or papillary lesions.  Attention was directed to the left  ureteral orifice and a 0.038 Sensor wire was then advanced up the ureter into the renal pelvis under fluoroscopic guidance.  A 4.5 Fr semirigid ureteroscope was then advanced into the ureter next to the guidewire and the calculus was identified.  The calculus was placed in a 1.9 French nitinol basket and removed without difficulty.  The ureteroscope was readvanced and 1 smaller fragment was identified which was removed.  Retrograde pyelogram was then performed through the cystoscope with findings as described above.  The wire was then backloaded through the cystoscope and a 6 French/24 cm ureteral stent was advance over the wire using Seldinger technique.  The bladder was then emptied and the procedure ended.  The patient appeared to tolerate the procedure well and without complications.  After anesthetic reversal the patient was transported to the PACU in stable condition.   John Giovanni, MD

## 2018-04-09 NOTE — ED Provider Notes (Signed)
[facility] Emergency Department Provider Note  ____________________________________________   I have reviewed the triage vital signs and the nursing notes. Where available I have reviewed prior notes and, if possible and indicated, outside hospital notes.    HISTORY  Chief Complaint Flank Pain    HPI Gina Middleton is a 83 y.o. female who presents today complaining of left-sided flank pain.  Patient does have a history of kidney stones.  She states she has had some dysuria and some subjective fevers at home yesterday and then began having flank pain last night.  No vomiting positive nausea no objective fever but did feel subjectively warm.  She states that she has had kidney stones but has not had this kind of dysuria before.  She denies any chest pain shortness of breath, lightheadedness.  Pain is sharp, waxes and wanes on its own, at this time it is minimal, she has no request for pain meds at this moment.  However she will let me know if it comes back.  Is a crampy discomfort with no other radiation.  No other alleviating or aggravating symptoms or prior treatment.  Past Medical History:  Diagnosis Date  . Hypertension   . Renal disorder     Patient Active Problem List   Diagnosis Date Noted  . Renal stone 04/09/2018  . Achilles tendonitis 05/12/2015  . Allergic rhinitis 09/20/2014  . Benign paroxysmal positional nystagmus 09/20/2014  . Blood glucose elevated 09/20/2014  . Neuralgia neuritis, sciatic nerve 09/20/2014  . Vegetarian 09/20/2014  . B12 deficiency 09/20/2014  . Avitaminosis D 09/20/2014  . Hypertension 09/07/2014    Past Surgical History:  Procedure Laterality Date  . APPENDECTOMY    . [place]  . CHOLECYSTECTOMY      Prior to Admission medications   Medication Sig Start Date End Date Taking? Authorizing Provider  amLODipine (NORVASC) 5 MG tablet Take 1 tablet (5 mg total) by mouth daily. 09/12/17  Yes [name], PA-C  aspirin 81 MG tablet Take 81 mg by mouth daily.  02/16/10  Yes [provider]  Biotin 1000 MCG tablet Take 1,000 mcg by mouth daily.   Yes [provider]  Cholecalciferol (VITAMIN D3) 25 MCG (1000 UT) CAPS Take 1,000 Units by mouth daily.    Yes [provider]  CRANBERRY PO Take 1 tablet by mouth daily. Skips occasionally   Yes [provider]  Cyanocobalamin 1000 MCG TBCR Take 1,000 mcg by mouth daily.  05/13/11  Yes [provider]  Omega-3 Fatty Acids (FISH OIL) 1000 MG CPDR Take 1,000 mg by mouth daily.  02/16/10  Yes [provider]    Allergies Other; Hydrocodone-acetaminophen; Iodinated diagnostic agents; and Oxycodone-acetaminophen  Family History  Problem Relation Age of Onset  . Hypertension Mother   . Alzheimer's disease Mother   . Heart attack Father        x's 3  . Diabetes Sister   . Heart attack Sister   . Breast cancer Maternal Grandmother     Social History Social History   Tobacco Use  . Smoking status: Never Smoker  . Smokeless tobacco: Never Used  Substance Use Topics  . Alcohol use: No  . Drug use: No    Review of Systems Constitutional: Objective fever Eyes: No visual changes. ENT: No sore throat. No stiff neck no neck pain Cardiovascular: Denies chest pain. Respiratory: Denies shortness of breath. Gastrointestinal:   no vomiting.  No diarrhea.  No  constipation. Genitourinary: + for dysuria. Musculoskeletal: Negative lower extremity swelling Skin: Negative for rash. Neurological: Negative for severe headaches, focal weakness or numbness.   ____________________________________________   PHYSICAL EXAM:  VITAL SIGNS: ED Triage Vitals  Enc Vitals Group     BP 04/09/18 0457 (!) 163/71     Pulse Rate 04/09/18 0457 82     Resp 04/09/18 0457 18     Temp 04/09/18 0457 98.5 F (36.9 C)     Temp Source 04/09/18 0457 Oral     SpO2 04/09/18 0457 99 %     Weight 04/09/18  0459 121 lb (54.9 kg)     Height 04/09/18 0459 5\' 3"  (1.6 m)     Head Circumference --      Peak Flow --      Pain Score 04/09/18 0458 9     Pain Loc --      Pain Edu? --      Excl. in [place]? --     Constitutional: Alert and oriented. Well appearing and in no acute distress. Eyes: Conjunctivae are normal Head: Atraumatic HEENT: No congestion/rhinnorhea. Mucous membranes are moist.  Oropharynx non-erythematous Neck:   Nontender with no meningismus, no masses, no stridor Cardiovascular: Normal rate, regular rhythm. Grossly normal heart sounds.  Good peripheral circulation. Respiratory: Normal respiratory effort.  No retractions. Lungs CTAB. Abdominal: Soft and nontender. No distention. No guarding no rebound Back:  There is no focal tenderness or step off.  there is no midline tenderness there are no lesions noted. there is mild left CVA tenderness  Musculoskeletal: No lower extremity tenderness, no upper extremity tenderness. No joint effusions, no DVT signs strong distal pulses no edema Neurologic:  Normal speech and language. No gross focal neurologic deficits are appreciated.  Skin:  Skin is warm, dry and intact. No rash noted. Psychiatric: Mood and affect are normal. Speech and behavior are normal.  ____________________________________________   LABS (all labs ordered are listed, but only abnormal results are displayed)  Labs Reviewed  URINALYSIS, COMPLETE (UACMP) WITH MICROSCOPIC - Abnormal; Notable for the following components:      Result Value   Color, Urine YELLOW (*)    APPearance CLOUDY (*)    Specific Gravity, Urine 1.004 (*)    Hgb urine dipstick LARGE (*)    Protein, ur 30 (*)    Leukocytes,Ua LARGE (*)    RBC / HPF >50 (*)    WBC, UA >50 (*)    Bacteria, UA RARE (*)    Non Squamous Epithelial PRESENT (*)    All other components within normal limits  CBC WITH DIFFERENTIAL/PLATELET - Abnormal; Notable for the following components:   WBC 11.3 (*)    Neutro Abs  9.0 (*)    All other components within normal limits  COMPREHENSIVE METABOLIC PANEL - Abnormal; Notable for the following components:   Glucose, Bld 130 (*)    Total Bilirubin 1.4 (*)    All other components within normal limits  URINE CULTURE    Pertinent labs  results that were available during my care of the patient were reviewed by me and considered in my medical decision making (see chart for details). ____________________________________________  EKG  I personally interpreted any EKGs ordered by me or triage  ____________________________________________  RADIOLOGY  Pertinent labs & imaging results that were available during my care of the patient were reviewed by me and considered in my medical decision making (see chart for details). If possible, patient and/or family made aware of  any abnormal findings.  Ct Renal Stone Study  Result Date: 04/09/2018 CLINICAL DATA:  Flank pain with stone disease suspected EXAM: CT ABDOMEN AND PELVIS WITHOUT CONTRAST TECHNIQUE: Multidetector CT imaging of the abdomen and pelvis was performed following the standard protocol without IV contrast. COMPARISON:  04/01/2016 FINDINGS: Lower chest: No acute finding. Mild lung scarring along endplate osteophytes in the right lower lobe. Calcified right lower lobe granuloma. Hepatobiliary: Tiny low-density in the upper right liver that is too small for densitometry.Cholecystectomy. Pancreas: Unremarkable. Spleen: Unremarkable. Adrenals/Urinary Tract: Negative adrenals. Mild left hydronephrosis and hydroureter with perinephric edema due to a 3 mm stone along the left pelvic sidewall. Two right renal calculi that measure up to 2 mm. Right upper pole renal cystic density. Tiny presumed hemorrhagic cyst in the lower pole left kidney. Negative bladder. Stomach/Bowel: No obstruction. Appendectomy. Numerous left-sided colonic diverticula Vascular/Lymphatic: Mild atherosclerotic calcification . No mass or adenopathy  Reproductive:Normal for age Other: Trace pelvic fluid, nonspecific. Musculoskeletal: Severe hip osteoarthritis. Spondylosis, disc narrowing, and osteopenia IMPRESSION: 1. 3 mm distal left ureteral calculus with mild hydronephrosis. 2. Two small right renal calculi. Electronically Signed   By: Monte Fantasia M.D.   On: 04/09/2018 07:11   ____________________________________________    PROCEDURES  Procedure(s) performed: None  Procedures  Critical Care performed: None  ____________________________________________   INITIAL IMPRESSION / ASSESSMENT AND PLAN / ED COURSE  Pertinent labs & imaging results that were available during my care of the patient were reviewed by me and considered in my medical decision making (see chart for details).  Patient here with a kidney stone, 3 mm at the distal ureter however, she is also making complaints of possible UTI with cloudy urine and leukocytes and white cells and bacteria.  Any time a kidney stone with mild hydronephrosis is also accompanied by a UTI is of course concerning given potential for decompensation.  For this reason we are sending a urine culture giving her IV antibiotics I talked to Dr. Bernardo Heater and he will evaluate the patient he did asked me to admit the patient to the hospitalist service which I have done.    ____________________________________________   FINAL CLINICAL IMPRESSION(S) / ED DIAGNOSES  Final diagnoses:  Renal colic  Urinary tract infection with hematuria, site unspecified      This chart was dictated using voice recognition software.  Despite best efforts to proofread,  errors can occur which can change meaning.      Schuyler Amor, MD 04/09/18 [id number]

## 2018-04-09 NOTE — ED Notes (Addendum)
Pt up to bedside toilet now. Urinated. Pt refusing pain meds.

## 2018-04-09 NOTE — Consult Note (Signed)
Urology Consult  Chief Complaint: Left flank pain  History of Present Illness: Gina Middleton is a 83 y.o. year old seen in consultation at the request of Dr. Burlene Arnt for evaluation of a left flank pain.  She presented to the ED this morning with onset of left flank pain last night.  The pain was nonradiating and intensity was rated moderate to severe.  There were no identifiable precipitating, aggravating or alleviating factors.  She had onset of mild dysuria and subjective fever yesterday.  She was afebrile in the ED. Complains of nausea without vomiting.  Does have a prior history of stone disease.  Evaluation in the ED included a stone protocol CT of the abdomen and pelvis which showed mild left hydronephrosis and hydroureter secondary to a 3 mm left distal ureteral calculus.  She also had mild nonobstructing right renal calculi.  Urinalysis was equivocal with >50 RBC/>50 WBC.  Urine was nitrite negative.  He had mild leukocytosis at 11.3.  Creatinine was 0.65.   Past Medical History:  Diagnosis Date  . Hypertension   . Renal disorder     Past Surgical History:  Procedure Laterality Date  . APPENDECTOMY    . [place]  . CHOLECYSTECTOMY      Home Medications:  Current Meds  Medication Sig  . amLODipine (NORVASC) 5 MG tablet Take 1 tablet (5 mg total) by mouth daily.  Marland Kitchen aspirin 81 MG tablet Take 81 mg by mouth daily.   . Biotin 1000 MCG tablet Take 1,000 mcg by mouth daily.  . Cholecalciferol (VITAMIN D3) 25 MCG (1000 UT) CAPS Take 1,000 Units by mouth daily.   Marland Kitchen CRANBERRY PO Take 1 tablet by mouth daily. Skips occasionally  . Cyanocobalamin 1000 MCG TBCR Take 1,000 mcg by mouth daily.   . Omega-3 Fatty Acids (FISH OIL) 1000 MG CPDR Take 1,000 mg by mouth daily.     Allergies:  Allergies  Allergen Reactions  . Other Anaphylaxis    A muscle relaxer in the 70s but cannot remember name of the medication that caused it  . Hydrocodone-Acetaminophen Other  (See Comments)    dizziness  . Iodinated Diagnostic Agents   . Oxycodone-Acetaminophen     Family History  Problem Relation Age of Onset  . Hypertension Mother   . Alzheimer's disease Mother   . Heart attack Father        x's 3  . Diabetes Sister   . Heart attack Sister   . Breast cancer Maternal Grandmother     Social History:  reports that she has never smoked. She has never used smokeless tobacco. She reports that she does not drink alcohol or use drugs.  ROS: A complete review of systems was performed.  All systems are negative except for pertinent findings as noted.  Physical Exam:  Vital signs in last 24 hours: Temp:  [98.5 F (36.9 C)] 98.5 F (36.9 C) (02/20 0457) Pulse Rate:  [75-82] 75 (02/20 0736) Resp:  [18] 18 (02/20 0736) BP: (145-163)/(71-76) 145/76 (02/20 0736) SpO2:  [99 %] 99 % (02/20 0736) Weight:  [54.9 kg] 54.9 kg (02/20 0459) Constitutional:  Alert and oriented, No acute distress HEENT: [place] AT, moist mucus membranes.  Trachea midline, no masses Cardiovascular: Regular rate and rhythm, no clubbing, cyanosis, or edema. Respiratory: Normal respiratory effort, lungs clear bilaterally GI: Abdomen is soft, nontender, nondistended, no abdominal masses GU: No CVA tenderness Skin: No rashes, bruises or suspicious lesions Lymph: No cervical  or inguinal adenopathy Neurologic: Grossly intact, no focal deficits, moving all 4 extremities Psychiatric: Normal mood and affect   Laboratory Data:  Recent Labs    04/09/18 0550  WBC 11.3*  HGB 12.5  HCT 38.0   Recent Labs    04/09/18 0550  NA 139  K 3.9  CL 106  CO2 25  GLUCOSE 130*  BUN 12  CREATININE 0.65  CALCIUM 9.1   No results for input(s): LABPT, INR in the last 72 hours. No results for input(s): LABURIN in the last 72 hours. Results for orders placed or performed in visit on 04/05/16  Microscopic Examination     Status: Abnormal   Collection Time: 04/05/16  9:24 AM  Result Value Ref Range  Status   WBC, UA >30 (A) 0 - 5 /hpf Final   RBC, UA >30 (A) 0 - 2 /hpf Final   Epithelial Cells (non renal) 0-10 0 - 10 /hpf Final   Bacteria, UA Few None seen/Few Final     Radiologic Imaging: Ct Renal Stone Study  Result Date: 04/09/2018 CLINICAL DATA:  Flank pain with stone disease suspected EXAM: CT ABDOMEN AND PELVIS WITHOUT CONTRAST TECHNIQUE: Multidetector CT imaging of the abdomen and pelvis was performed following the standard protocol without IV contrast. COMPARISON:  04/01/2016 FINDINGS: Lower chest: No acute finding. Mild lung scarring along endplate osteophytes in the right lower lobe. Calcified right lower lobe granuloma. Hepatobiliary: Tiny low-density in the upper right liver that is too small for densitometry.Cholecystectomy. Pancreas: Unremarkable. Spleen: Unremarkable. Adrenals/Urinary Tract: Negative adrenals. Mild left hydronephrosis and hydroureter with perinephric edema due to a 3 mm stone along the left pelvic sidewall. Two right renal calculi that measure up to 2 mm. Right upper pole renal cystic density. Tiny presumed hemorrhagic cyst in the lower pole left kidney. Negative bladder. Stomach/Bowel: No obstruction. Appendectomy. Numerous left-sided colonic diverticula Vascular/Lymphatic: Mild atherosclerotic calcification . No mass or adenopathy Reproductive:Normal for age Other: Trace pelvic fluid, nonspecific. Musculoskeletal: Severe hip osteoarthritis. Spondylosis, disc narrowing, and osteopenia IMPRESSION: 1. 3 mm distal left ureteral calculus with mild hydronephrosis. 2. Two small right renal calculi. Electronically Signed   By: Monte Fantasia M.D.   On: 04/09/2018 07:11   CT images were personally reviewed   Impression/Assessment:  83 year old female with a 3 mm left distal ureteral calculus and mild hydronephrosis/hydroureter.  She has microhematuria/pyuria and mild leukocytosis.  She received Rocephin in the ED and will be admitted to the hospitalist service for IV  antibiotics.    Plan:  I recommended cystoscopy with left ureteral stent placement and consideration of left ureteroscopy with stone removal based on stone size and location and soft UTI signs.  The procedure was discussed in detail including potential risks of bleeding, worsening sepsis and ureteral injury.  She indicated all questions answered and desires to proceed.   04/09/2018, 9:46 AM  John Giovanni,  MD

## 2018-04-09 NOTE — Progress Notes (Signed)
Family Meeting Note  Advance Directive:no  Today a meeting took place with the Patient.   The following clinical team members were present during this meeting:MD  The following were discussed:Patient's diagnosis: kidney stone, Patient's progosis: Unable to determine and Goals for treatment: Full Code  Additional follow-up to be provided: chaplain consult for AD  Time spent during discussion:16 minutes  Gina Middleton, Ulice Bold, MD

## 2018-04-09 NOTE — Anesthesia Preprocedure Evaluation (Signed)
Anesthesia Evaluation  Patient identified by MRN, date of birth, ID band Patient awake    Reviewed: Allergy & Precautions, H&P , NPO status , Patient's Chart, lab work & pertinent test results  History of Anesthesia Complications Negative for: history of anesthetic complications  Airway Mallampati: III  TM Distance: <3 FB Neck ROM: limited    Dental  (+) Chipped   Pulmonary neg shortness of breath,           Cardiovascular Exercise Tolerance: Good hypertension, (-) angina(-) Past MI and (-) DOE      Neuro/Psych  Neuromuscular disease negative psych ROS   GI/Hepatic negative GI ROS, Neg liver ROS, neg GERD  ,  Endo/Other  negative endocrine ROS  Renal/GU Renal disease     Musculoskeletal   Abdominal   Peds  Hematology negative hematology ROS (+)   Anesthesia Other Findings Past Medical History: No date: Hypertension No date: Renal disorder  Past Surgical History: No date: APPENDECTOMY 1969: BACK SURGERY No date: CHOLECYSTECTOMY  BMI    Body Mass Index:  21.43 kg/m      Reproductive/Obstetrics negative OB ROS                             Anesthesia Physical Anesthesia Plan  ASA: III  Anesthesia Plan: General LMA   Post-op Pain Management:    Induction: Intravenous  PONV Risk Score and Plan: Dexamethasone, Ondansetron, Midazolam and Treatment may vary due to age or medical condition  Airway Management Planned: LMA  Additional Equipment:   Intra-op Plan:   Post-operative Plan: Extubation in OR  Informed Consent: I have reviewed the patients History and Physical, chart, labs and discussed the procedure including the risks, benefits and alternatives for the proposed anesthesia with the patient or authorized representative who has indicated his/her understanding and acceptance.     Dental Advisory Given  Plan Discussed with: Anesthesiologist, CRNA and  Surgeon  Anesthesia Plan Comments: (Patient consented for risks of anesthesia including but not limited to:  - adverse reactions to medications - damage to teeth, lips or other oral mucosa - sore throat or hoarseness - Damage to heart, brain, lungs or loss of life  Patient voiced understanding.)        Anesthesia Quick Evaluation

## 2018-04-09 NOTE — H&P (View-Only) (Signed)
Urology Consult  Chief Complaint: Left flank pain  History of Present Illness: Gina Middleton is a 83 y.o. year old seen in consultation at the request of Dr. Burlene Arnt for evaluation of a left flank pain.  She presented to the ED this morning with onset of left flank pain last night.  The pain was nonradiating and intensity was rated moderate to severe.  There were no identifiable precipitating, aggravating or alleviating factors.  She had onset of mild dysuria and subjective fever yesterday.  She was afebrile in the ED. Complains of nausea without vomiting.  Does have a prior history of stone disease.  Evaluation in the ED included a stone protocol CT of the abdomen and pelvis which showed mild left hydronephrosis and hydroureter secondary to a 3 mm left distal ureteral calculus.  She also had mild nonobstructing right renal calculi.  Urinalysis was equivocal with >50 RBC/>50 WBC.  Urine was nitrite negative.  He had mild leukocytosis at 11.3.  Creatinine was 0.65.   Past Medical History:  Diagnosis Date  . Hypertension   . Renal disorder     Past Surgical History:  Procedure Laterality Date  . APPENDECTOMY    . [place]  . CHOLECYSTECTOMY      Home Medications:  Current Meds  Medication Sig  . amLODipine (NORVASC) 5 MG tablet Take 1 tablet (5 mg total) by mouth daily.  Marland Kitchen aspirin 81 MG tablet Take 81 mg by mouth daily.   . Biotin 1000 MCG tablet Take 1,000 mcg by mouth daily.  . Cholecalciferol (VITAMIN D3) 25 MCG (1000 UT) CAPS Take 1,000 Units by mouth daily.   Marland Kitchen CRANBERRY PO Take 1 tablet by mouth daily. Skips occasionally  . Cyanocobalamin 1000 MCG TBCR Take 1,000 mcg by mouth daily.   . Omega-3 Fatty Acids (FISH OIL) 1000 MG CPDR Take 1,000 mg by mouth daily.     Allergies:  Allergies  Allergen Reactions  . Other Anaphylaxis    A muscle relaxer in the 70s but cannot remember name of the medication that caused it  . Hydrocodone-Acetaminophen Other  (See Comments)    dizziness  . Iodinated Diagnostic Agents   . Oxycodone-Acetaminophen     Family History  Problem Relation Age of Onset  . Hypertension Mother   . Alzheimer's disease Mother   . Heart attack Father        x's 3  . Diabetes Sister   . Heart attack Sister   . Breast cancer Maternal Grandmother     Social History:  reports that she has never smoked. She has never used smokeless tobacco. She reports that she does not drink alcohol or use drugs.  ROS: A complete review of systems was performed.  All systems are negative except for pertinent findings as noted.  Physical Exam:  Vital signs in last 24 hours: Temp:  [98.5 F (36.9 C)] 98.5 F (36.9 C) (02/20 0457) Pulse Rate:  [75-82] 75 (02/20 0736) Resp:  [18] 18 (02/20 0736) BP: (145-163)/(71-76) 145/76 (02/20 0736) SpO2:  [99 %] 99 % (02/20 0736) Weight:  [54.9 kg] 54.9 kg (02/20 0459) Constitutional:  Alert and oriented, No acute distress HEENT: [place] AT, moist mucus membranes.  Trachea midline, no masses Cardiovascular: Regular rate and rhythm, no clubbing, cyanosis, or edema. Respiratory: Normal respiratory effort, lungs clear bilaterally GI: Abdomen is soft, nontender, nondistended, no abdominal masses GU: No CVA tenderness Skin: No rashes, bruises or suspicious lesions Lymph: No cervical  or inguinal adenopathy Neurologic: Grossly intact, no focal deficits, moving all 4 extremities Psychiatric: Normal mood and affect   Laboratory Data:  Recent Labs    04/09/18 0550  WBC 11.3*  HGB 12.5  HCT 38.0   Recent Labs    04/09/18 0550  NA 139  K 3.9  CL 106  CO2 25  GLUCOSE 130*  BUN 12  CREATININE 0.65  CALCIUM 9.1   No results for input(s): LABPT, INR in the last 72 hours. No results for input(s): LABURIN in the last 72 hours. Results for orders placed or performed in visit on 04/05/16  Microscopic Examination     Status: Abnormal   Collection Time: 04/05/16  9:24 AM  Result Value Ref Range  Status   WBC, UA >30 (A) 0 - 5 /hpf Final   RBC, UA >30 (A) 0 - 2 /hpf Final   Epithelial Cells (non renal) 0-10 0 - 10 /hpf Final   Bacteria, UA Few None seen/Few Final     Radiologic Imaging: Ct Renal Stone Study  Result Date: 04/09/2018 CLINICAL DATA:  Flank pain with stone disease suspected EXAM: CT ABDOMEN AND PELVIS WITHOUT CONTRAST TECHNIQUE: Multidetector CT imaging of the abdomen and pelvis was performed following the standard protocol without IV contrast. COMPARISON:  04/01/2016 FINDINGS: Lower chest: No acute finding. Mild lung scarring along endplate osteophytes in the right lower lobe. Calcified right lower lobe granuloma. Hepatobiliary: Tiny low-density in the upper right liver that is too small for densitometry.Cholecystectomy. Pancreas: Unremarkable. Spleen: Unremarkable. Adrenals/Urinary Tract: Negative adrenals. Mild left hydronephrosis and hydroureter with perinephric edema due to a 3 mm stone along the left pelvic sidewall. Two right renal calculi that measure up to 2 mm. Right upper pole renal cystic density. Tiny presumed hemorrhagic cyst in the lower pole left kidney. Negative bladder. Stomach/Bowel: No obstruction. Appendectomy. Numerous left-sided colonic diverticula Vascular/Lymphatic: Mild atherosclerotic calcification . No mass or adenopathy Reproductive:Normal for age Other: Trace pelvic fluid, nonspecific. Musculoskeletal: Severe hip osteoarthritis. Spondylosis, disc narrowing, and osteopenia IMPRESSION: 1. 3 mm distal left ureteral calculus with mild hydronephrosis. 2. Two small right renal calculi. Electronically Signed   By: Monte Fantasia M.D.   On: 04/09/2018 07:11   CT images were personally reviewed   Impression/Assessment:  83 year old female with a 3 mm left distal ureteral calculus and mild hydronephrosis/hydroureter.  She has microhematuria/pyuria and mild leukocytosis.  She received Rocephin in the ED and will be admitted to the hospitalist service for IV  antibiotics.    Plan:  I recommended cystoscopy with left ureteral stent placement and consideration of left ureteroscopy with stone removal based on stone size and location and soft UTI signs.  The procedure was discussed in detail including potential risks of bleeding, worsening sepsis and ureteral injury.  She indicated all questions answered and desires to proceed.   04/09/2018, 9:46 AM  John Giovanni,  MD

## 2018-04-09 NOTE — Progress Notes (Signed)
   04/09/18 1100  Clinical Encounter Type  Visited With Patient  Visit Type Initial  Referral From Physician   OR received to complete or update an AD. Upon arrival, patient was awake, alert, and resting in bed. She was pleasant and noted that her pain had decreased a great deal since her arrival earlier this morning. Patient confirmed prior completion of POA this summer with her attorney; no needPatient shared information about her professional and personal life, family, and some personal interests. She anticipates a short hospital stay for observation and would welcome a follow-up visit.

## 2018-04-09 NOTE — Anesthesia Procedure Notes (Signed)
Procedure Name: LMA Insertion Date/Time: 04/09/2018 1:38 PM Performed by: Hedda Slade, CRNA Pre-anesthesia Checklist: Patient identified, Patient being monitored, Timeout performed, Emergency Drugs available and Suction available Patient Re-evaluated:Patient Re-evaluated prior to induction Oxygen Delivery Method: Circle system utilized Preoxygenation: Pre-oxygenation with 100% oxygen Induction Type: IV induction Ventilation: Mask ventilation without difficulty LMA: LMA inserted LMA Size: 3.5 Tube type: Oral Number of attempts: 1 Placement Confirmation: positive ETCO2 and breath sounds checked- equal and bilateral Tube secured with: Tape Dental Injury: Teeth and Oropharynx as per pre-operative assessment

## 2018-04-09 NOTE — ED Triage Notes (Signed)
Patient coming in for left sided flank pain that started at 6pm l;ast night and states she had a kidney stone 2 years ago and it feels like that but also states she is having dysuria and bladder spasms and chills.

## 2018-04-09 NOTE — Interval H&P Note (Signed)
History and Physical Interval Note:  04/09/2018 1:21 PM  Gina Middleton  has presented today for surgery, with the diagnosis of LEFT  The various methods of treatment have been discussed with the patient and family. After consideration of risks, benefits and other options for treatment, the patient has consented to  Procedure(s): CYSTOSCOPY WITH STENT PLACEMENT-LEFT (Left) POSSIBLE URETEROSCOPY-LEFT (Left) as a surgical intervention .  The patient's history has been reviewed, patient examined, no change in status, stable for surgery.  I have reviewed the patient's chart and labs.  Questions were answered to the patient's satisfaction.     [place]

## 2018-04-09 NOTE — Transfer of Care (Signed)
Immediate Anesthesia Transfer of Care Note  Patient: Gina Middleton  Procedure(s) Performed: CYSTOSCOPY WITH STENT PLACEMENT-LEFT (Left Ureter) POSSIBLE URETEROSCOPY-LEFT (Left Ureter) CYSTOSCOPY WITH RETROGRADE PYELOGRAM (Left Ureter)  Patient Location: PACU  Anesthesia Type:General  Level of Consciousness: sedated  Airway & Oxygen Therapy: Patient Spontanous Breathing and Patient connected to face mask oxygen  Post-op Assessment: Report given to RN and Post -op Vital signs reviewed and stable  Post vital signs: Reviewed and stable  Last Vitals:  Vitals Value Taken Time  BP 111/64 04/09/2018  2:19 PM  Temp 36.5 C 04/09/2018  2:19 PM  Pulse 66 04/09/2018  2:19 PM  Resp 11 04/09/2018  2:19 PM  SpO2 100 % 04/09/2018  2:19 PM    Last Pain:  Vitals:   04/09/18 1257  TempSrc: Tympanic  PainSc: 3          Complications: No apparent anesthesia complications

## 2018-04-09 NOTE — ED Notes (Signed)
Report given to same day surgery RN

## 2018-04-10 ENCOUNTER — Telehealth: Payer: Self-pay | Admitting: Urology

## 2018-04-10 ENCOUNTER — Encounter: Payer: Self-pay | Admitting: Urology

## 2018-04-10 ENCOUNTER — Inpatient Hospital Stay: Payer: Medicare Other

## 2018-04-10 LAB — BASIC METABOLIC PANEL
Anion gap: 8 (ref 5–15)
BUN: 15 mg/dL (ref 8–23)
CO2: 21 mmol/L — ABNORMAL LOW (ref 22–32)
Calcium: 8.3 mg/dL — ABNORMAL LOW (ref 8.9–10.3)
Chloride: 111 mmol/L (ref 98–111)
Creatinine, Ser: 0.66 mg/dL (ref 0.44–1.00)
GFR calc non Af Amer: >60 mL/min (ref >60)
Glucose, Bld: 131 mg/dL — ABNORMAL HIGH (ref 70–99)
Potassium: 3.7 mmol/L (ref 3.5–5.1)
Sodium: 140 mmol/L (ref 135–145)

## 2018-04-10 LAB — CBC
HCT: 40.5 % (ref 36.0–46.0)
Hemoglobin: 13.4 g/dL (ref 12.0–15.0)
MCH: 30.2 pg (ref 26.0–34.0)
MCHC: 33.1 g/dL (ref 30.0–36.0)
MCV: 91.4 fL (ref 80.0–100.0)
Platelets: 121 10*3/uL — ABNORMAL LOW (ref 150–400)
RBC: 4.43 MIL/uL (ref 3.87–5.11)
RDW: 12.6 % (ref 11.5–15.5)
WBC: 11.6 10*3/uL — ABNORMAL HIGH (ref 4.0–10.5)
nRBC: 0 % (ref 0.0–0.2)

## 2018-04-10 NOTE — Telephone Encounter (Signed)
-----   Message from Abbie Sons, MD sent at 04/10/2018 10:58 AM EST ----- Regarding: Follow-up Please schedule cystoscopy with stent removal approximately 2 weeks

## 2018-04-10 NOTE — Progress Notes (Addendum)
Urology Consult Follow Up  Subjective: Pain resolved status post stent placement.  Had an episode of chills and nausea this morning.  Preoperative urine culture growing E. coli.  Anti-infectives: Anti-infectives (From admission, onward)   Start     Dose/Rate Route Frequency Ordered Stop   04/09/18 1030  cefTRIAXone (ROCEPHIN) 1 g in sodium chloride 0.9 % 100 mL IVPB     1 g 200 mL/hr over 30 Minutes Intravenous Every 24 hours 04/09/18 1021     04/09/18 0800  cefTRIAXone (ROCEPHIN) 1 g in sodium chloride 0.9 % 100 mL IVPB     1 g 200 mL/hr over 30 Minutes Intravenous  Once 04/09/18 0751 04/09/18 0908      Current Facility-Administered Medications  Medication Dose Route Frequency Provider Last Rate Last Dose  . 0.9 %  sodium chloride infusion   Intravenous Continuous Abbie Sons, MD 125 mL/hr at 04/10/18 0006    . acetaminophen (TYLENOL) tablet 650 mg  650 mg Oral Q6H PRN Josiyah Tozzi C, MD       Or  . acetaminophen (TYLENOL) suppository 650 mg  650 mg Rectal Q6H PRN Mohid Furuya C, MD      . amLODipine (NORVASC) tablet 5 mg  5 mg Oral Daily Hervey Wedig C, MD      . aspirin EC tablet 81 mg  81 mg Oral Daily Trejon Duford C, MD      . bisacodyl (DULCOLAX) suppository 10 mg  10 mg Rectal Daily PRN Avyukth Bontempo C, MD      . cefTRIAXone (ROCEPHIN) 1 g in sodium chloride 0.9 % 100 mL IVPB  1 g Intravenous Q24H Loryn Haacke C, MD      . cholecalciferol (VITAMIN D3) tablet 1,000 Units  1,000 Units Oral Daily Abbie Sons, MD   1,000 Units at 04/09/18 1209  . enoxaparin (LOVENOX) injection 40 mg  40 mg Subcutaneous Q24H Silvestre Mines C, MD      . morphine 2 MG/ML injection 2 mg  2 mg Intravenous Q4H PRN Shericka Johnstone C, MD      . ondansetron (ZOFRAN) tablet 4 mg  4 mg Oral Q6H PRN Margarie Mcguirt C, MD       Or  . ondansetron (ZOFRAN) injection 4 mg  4 mg Intravenous Q6H PRN Hayward Rylander C, MD   4 mg at 04/09/18 1341  . polyethylene glycol (MIRALAX / GLYCOLAX) packet 17  g  17 g Oral Daily PRN Gurnoor Sloop C, MD      . vitamin B-12 (CYANOCOBALAMIN) tablet 1,000 mcg  1,000 mcg Oral Daily Urvi Imes C, MD   1,000 mcg at 04/09/18 2306     Objective: Vital signs in last 24 hours: Temp:  [97.7 F (36.5 C)-99.9 F (37.7 C)] 98.9 F (37.2 C) (02/21 0429) Pulse Rate:  [66-108] 67 (02/21 0429) Resp:  [11-21] 16 (02/21 0429) BP: (111-156)/(62-97) 122/62 (02/21 0429) SpO2:  [96 %-100 %] 97 % (02/21 0429)  Intake/Output from previous day: 02/20 0701 - 02/21 0700 In: 2420.8 [I.V.:2420.8] Out: -  Intake/Output this shift: Total I/O In: 120 [P.O.:120] Out: -    Physical Exam: In no acute distress.  Abdomen soft, nontender  Lab Results:  Recent Labs    04/09/18 1040 04/10/18 0248  WBC 9.9 11.6*  HGB 11.8* 13.4  HCT 35.2* 40.5  PLT 197 121*   BMET Recent Labs    04/09/18 0550 04/09/18 1040 04/10/18 0248  NA 139  --  140  K 3.9  --  3.7  CL 106  --  111  CO2 25  --  21*  GLUCOSE 130*  --  131*  BUN 12  --  15  CREATININE 0.65 0.62 0.66  CALCIUM 9.1  --  8.3*    Assessment: Doing well status post ureteroscopic stone removal/stent placement.  Recommendation: Would continue IV antibiotic for 24 hours pending urine culture/sensitivity. Will schedule cystoscopy with stent removal 1 to 2 weeks    LOS: 1 day    Ronda Fairly [facility] 04/10/2018

## 2018-04-10 NOTE — Telephone Encounter (Signed)
Gave app to Ginger on [employer]

## 2018-04-10 NOTE — Progress Notes (Signed)
[facility] [place] NAME: Gina Middleton    MR#:  [id number]  DATE OF BIRTH:  1935-07-01  SUBJECTIVE:  CHIEF COMPLAINT:   Chief Complaint  Patient presents with  . Flank Pain   Resting comfortably in bed when seen, s/p cystoscopy with stent placement and stone removal.  Denies any pain at rest or with urination + hematuria "a lot of blood". + headache but had not had anything to eat yet when seen. Drinking with no issues   + nausea, - vomiting. + some chills, afebrile   REVIEW OF SYSTEMS:  Review of Systems  Constitutional: Positive for chills.  Negative for fever and malaise/fatigue.  HENT: Negative.  Negative for ear discharge, ear pain, hearing loss, nosebleeds and sore throat.   Eyes: Negative.  Negative for blurred vision and pain.  Respiratory: Negative.  Negative for cough, hemoptysis, shortness of breath and wheezing.   Cardiovascular: Negative.  Negative for chest pain, palpitations and leg swelling.  Gastrointestinal: Positive for nausea. Negative for abdominal pain, blood in stool, diarrhea, and vomiting.  Genitourinary: Positive for dysuria and hematuria, negative for flank pain and frequency.  Musculoskeletal: Negative for back pain.  Skin: Negative.   Neurological: Negative for dizziness, tremors, speech change, focal weakness, seizures and headaches.  Endo/Heme/Allergies: Negative.  Does not bruise/bleed easily.  Psychiatric/Behavioral: Negative.  Negative for depression, hallucinations and suicidal ideations. DRUG ALLERGIES:   Allergies  Allergen Reactions  . Other Anaphylaxis    A muscle relaxer in the 70s but cannot remember name of the medication that caused it  . Hydrocodone-Acetaminophen Other (See Comments)    dizziness  . Iodinated Diagnostic Agents   . Oxycodone-Acetaminophen    VITALS:  Blood pressure 137/71, pulse 86, temperature 100.2 F (37.9 C), temperature source Oral, resp. rate 18, height 5\' 3"   (1.6 m), weight 54.9 kg, SpO2 94 %. PHYSICAL EXAMINATION:  Physical Exam Constitutional:      General: She is not in acute distress. HENT:     Head: Normocephalic.  Eyes:     General: No scleral icterus. Neck:     Musculoskeletal: Normal range of motion and neck supple.     Vascular: No JVD.     Trachea: No tracheal deviation.  Cardiovascular:     Rate and Rhythm: Normal rate and regular rhythm.     Heart sounds: Normal heart sounds. No murmur. No friction rub. No gallop.   Pulmonary:     Effort: Pulmonary effort is normal. No respiratory distress.     Breath sounds: Normal breath sounds. No wheezing or rales.  Chest:     Chest wall: No tenderness.  Abdominal:     General: Bowel sounds are normal. There is no distension.     Palpations: Abdomen is soft. There is no mass.     Tenderness: There is no abdominal tenderness. There is mild suprapubic tenderness.There is no guarding or rebound.  Musculoskeletal: Normal range of motion.  Skin:    General: Skin is warm.     Findings: No erythema or rash.  Neurological:     Mental Status: She is alert and oriented to person, place, and time.  Psychiatric:        Judgment: Judgment normal.    LABORATORY PANEL:   CBC Recent Labs  Lab 04/10/18 0248  WBC 11.6*  HGB 13.4  HCT 40.5  PLT 121*   ------------------------------------------------------------------------------------------------------------------ Chemistries  Recent Labs  Lab 04/09/18 0550  04/10/18 0248  NA 139  --  140  K 3.9  --  3.7  CL 106  --  111  CO2 25  --  21*  GLUCOSE 130*  --  131*  BUN 12  --  15  CREATININE 0.65   < > 0.66  CALCIUM 9.1  --  8.3*  AST 20  --   --   ALT 12  --   --   ALKPHOS 95  --   --   BILITOT 1.4*  --   --    < > = values in this interval not displayed.   RADIOLOGY:  Dg Abd 1 View  Result Date: 04/10/2018 CLINICAL DATA:  History of urinary tract stones. Double-J stent place on the left. EXAM: ABDOMEN - 1 VIEW COMPARISON:   CT abdomen and pelvis 04/09/2018. FINDINGS: Left double-J ureteral stent is in place. The proximal loop is partially uncoiled but the stent projects in good position. No ureteral stone is identified. No kidney stones are seen. The patient has multiple small calcifications in the pelvis consistent with phleboliths. Bowel gas pattern is unremarkable. Severe bilateral hip osteoarthritis is worse on the right. Mild convex right lumbar scoliosis and multilevel degenerative change are seen. IMPRESSION: Left double-J ureteral stent in place. The stent projects in good position. The proximal loop is partially uncoiled. 3 mm distal left ureteral stone seen on prior CT is not visualized on this examination. Multiple small calcifications in the pelvis are compatible with phleboliths. Severe bilateral hip osteoarthritis is worse on the right. Electronically Signed   By: Inge Rise M.D.   On: 04/10/2018 11:25   ASSESSMENT AND PLAN:   83 year old female with history of hypertension who presents with left-sided flank pain.  Plan:  1. 3 mm distal left ureteral calculus with mild hydronephrosis 2.  Acute cystitis due to calculus Dr. Katheren Shams following: procedure 2/20 stone removal and stent placement IV fluids IV Rocephin for 24 hours after procedure PRN pain control Urine culture: E coli  3.  Essential hypertension: Continue Norvasc   All the records are reviewed and case is discussed with Care Management/Social Worker. Management plans discussed with the patient and/or family and they are in agreement.  CODE STATUS: Full Code  TOTAL TIME TAKING CARE OF THIS PATIENT: 30 minutes.   More than 50% of the time was spent in counseling/coordination of care: YES  POSSIBLE D/C IN 1 DAYS, DEPENDING ON CLINICAL CONDITION.   Ripley Fraise PA-C on 04/10/2018 at 12:38 PM  Between 7am to 6pm - Pager - [phone]  After 6 pm go to [email or web address] - password [email or web address] [facility]  [employer] [facility] [facility]  Office  [phone]  CC: Primary care physician; Gina Daring, PA-C  Note: This dictation was prepared with Dragon dictation along with smaller phrase technology. Any transcriptional errors that result from this process are unintentional.

## 2018-04-11 LAB — BASIC METABOLIC PANEL
Anion gap: 4 — ABNORMAL LOW (ref 5–15)
BUN: 13 mg/dL (ref 8–23)
CO2: 24 mmol/L (ref 22–32)
Calcium: 7.8 mg/dL — ABNORMAL LOW (ref 8.9–10.3)
Chloride: 111 mmol/L (ref 98–111)
Creatinine, Ser: 0.84 mg/dL (ref 0.44–1.00)
GFR calc Af Amer: >60 mL/min (ref >60)
GLUCOSE: 106 mg/dL — AB (ref 70–99)
Potassium: 3.6 mmol/L (ref 3.5–5.1)
Sodium: 139 mmol/L (ref 135–145)

## 2018-04-11 LAB — CBC
HCT: 29.6 % — ABNORMAL LOW (ref 36.0–46.0)
Hemoglobin: 9.7 g/dL — ABNORMAL LOW (ref 12.0–15.0)
MCH: 30.2 pg (ref 26.0–34.0)
MCHC: 32.8 g/dL (ref 30.0–36.0)
MCV: 92.2 fL (ref 80.0–100.0)
Platelets: 132 10*3/uL — ABNORMAL LOW (ref 150–400)
RBC: 3.21 MIL/uL — ABNORMAL LOW (ref 3.87–5.11)
RDW: 12.8 % (ref 11.5–15.5)
WBC: 7.2 10*3/uL (ref 4.0–10.5)
nRBC: 0 % (ref 0.0–0.2)

## 2018-04-11 LAB — URINE CULTURE: Culture: >=100000 — AB

## 2018-04-11 NOTE — Progress Notes (Signed)
[facility] [place] NAME: Mearl Harewood    MR#:  [id number]  DATE OF BIRTH:  [birth year]  SUBJECTIVE:  CHIEF COMPLAINT:   Chief Complaint  Patient presents with  . Flank Pain   Fever, no abdominal pain, nausea or vomiting.  Better hematuria.  REVIEW OF SYSTEMS:  Review of Systems  Constitutional: Positive for fever. No malaise/fatigue.  HENT: Negative.  Negative for ear discharge, ear pain, hearing loss, nosebleeds and sore throat.   Eyes: Negative.  Negative for blurred vision and pain.  Respiratory: Negative.  Negative for cough, hemoptysis, shortness of breath and wheezing.   Cardiovascular: Negative.  Negative for chest pain, palpitations and leg swelling.  Gastrointestinal: No nausea. Negative for abdominal pain, blood in stool, diarrhea, and vomiting.  Genitourinary: No dysuria and hematuria, negative for flank pain and frequency.  Musculoskeletal: Negative for back pain.  Skin: Negative.   Neurological: Negative for dizziness, tremors, speech change, focal weakness, seizures and headaches.  Endo/Heme/Allergies: Negative.  Does not bruise/bleed easily.  Psychiatric/Behavioral: Negative.  Negative for depression, hallucinations and suicidal ideations. DRUG ALLERGIES:   Allergies  Allergen Reactions  . Other Anaphylaxis    A muscle relaxer in the 70s but cannot remember name of the medication that caused it  . Hydrocodone-Acetaminophen Other (See Comments)    dizziness  . Iodinated Diagnostic Agents   . Oxycodone-Acetaminophen    VITALS:  Blood pressure 136/66, pulse 76, temperature 99.5 F (37.5 C), temperature source Oral, resp. rate 18, height 5\' 3"  (1.6 m), weight 54.9 kg, SpO2 92 %. PHYSICAL EXAMINATION:  Physical Exam Constitutional:      General: She is not in acute distress. HENT:     Head: Normocephalic.  Eyes:     General: No scleral icterus. Neck:     Musculoskeletal: Normal range of motion and neck  supple.     Vascular: No JVD.     Trachea: No tracheal deviation.  Cardiovascular:     Rate and Rhythm: Normal rate and regular rhythm.     Heart sounds: Normal heart sounds. No murmur. No friction rub. No gallop.   Pulmonary:     Effort: Pulmonary effort is normal. No respiratory distress.     Breath sounds: Normal breath sounds. No wheezing or rales.  Chest:     Chest wall: No tenderness.  Abdominal:     General: Bowel sounds are normal. There is no distension.     Palpations: Abdomen is soft. There is no mass.     Tenderness: There is no abdominal tenderness. There is mild suprapubic tenderness.There is no guarding or rebound.  Musculoskeletal: Normal range of motion.  Skin:    General: Skin is warm.     Findings: No erythema or rash.  Neurological:     Mental Status: She is alert and oriented to person, place, and time.  Psychiatric:        Judgment: Judgment normal.    LABORATORY PANEL:   CBC Recent Labs  Lab 04/11/18 0421  WBC 7.2  HGB 9.7*  HCT 29.6*  PLT 132*   ------------------------------------------------------------------------------------------------------------------ Chemistries  Recent Labs  Lab 04/09/18 0550  04/11/18 0421  NA 139   < > 139  K 3.9   < > 3.6  CL 106   < > 111  CO2 25   < > 24  GLUCOSE 130*   < > 106*  BUN 12   < > 13  CREATININE 0.65   < >  0.84  CALCIUM 9.1   < > 7.8*  AST 20  --   --   ALT 12  --   --   ALKPHOS 95  --   --   BILITOT 1.4*  --   --    < > = values in this interval not displayed.   RADIOLOGY:  No results found. ASSESSMENT AND PLAN:   83 year old female with history of hypertension who presents with left-sided flank pain.  Plan:  1. 3 mm distal left ureteral calculus with mild hydronephrosis 2.  Acute cystitis due to calculus Dr. Katheren Shams following: procedure 2/20 stone removal and stent placement She is treated with IV fluids Continue IV Rocephin, PRN pain control Urine culture: E coli  3.   Essential hypertension: Continue Norvasc  All the records are reviewed and case is discussed with Care Management/Social Worker. Management plans discussed with the patient and/or family and they are in agreement.  CODE STATUS: Full Code  TOTAL TIME TAKING CARE OF THIS PATIENT: 32 minutes.   More than 50% of the time was spent in counseling/coordination of care: YES  POSSIBLE D/C IN 1-2 DAYS, DEPENDING ON CLINICAL CONDITION.   Demetrios Loll PA-C on 04/11/2018 at 1:02 PM  Between 7am to 6pm - Pager - [phone]  After 6 pm go to [email or web address] - password [email or web address] [facility]  [employer] [place]  Office  [phone]  CC: Primary care physician; [name], PA-C  Note: This dictation was prepared with Dragon dictation along with smaller phrase technology. Any transcriptional errors that result from this process are unintentional.

## 2018-04-12 LAB — HEMOGLOBIN: Hemoglobin: 10 g/dL — ABNORMAL LOW (ref 12.0–15.0)

## 2018-04-12 MED ORDER — CIPROFLOXACIN HCL 500 MG PO TABS: 500.0000 mg | ORAL_TABLET | Freq: Two times a day (BID) | ORAL | 0 refills | Status: AC

## 2018-04-12 NOTE — Discharge Summary (Addendum)
[facility] at [place] NAME: Gina Middleton    MR#:  [id number]  [place]:  11-08-1935  DATE OF ADMISSION:  04/09/2018   ADMITTING PHYSICIAN: Bettey Costa, MD  DATE OF DISCHARGE: 04/12/2018  PRIMARY CARE PHYSICIAN: [name], PA-C   ADMISSION DIAGNOSIS:  Renal colic [T05] Urinary tract infection with hematuria, site unspecified [N39.0, R31.9] Kidney stone [N20.0] DISCHARGE DIAGNOSIS:  Active Problems:   Renal stone   Kidney stone  SECONDARY DIAGNOSIS:   Past Medical History:  Diagnosis Date  . Hypertension   . Renal disorder    HOSPITAL COURSE:  83 year old female with history of hypertension who presents with left-sided flank pain.  Plan:  1.3 mm distal left ureteral calculus with mild hydronephrosis 2. Acute cystitis due to calculus Dr. Katheren Shams following: procedure 2/20 stone removal and stent placement She is treated with IV fluids and IV Rocephin, PRN pain control Urine culture: E coli, sensitive to many antibiotics.  Change to p.o. Cipro for 2 more days.  Follow-up with Dr. Bernardo Heater as outpatient.  3. Essential hypertension: Continue Norvasc DISCHARGE CONDITIONS:  Stable, discharge to home today. CONSULTS OBTAINED:  Treatment Team:  Abbie Sons, MD DRUG ALLERGIES:   Allergies  Allergen Reactions  . Other Anaphylaxis    A muscle relaxer in the 70s but cannot remember name of the medication that caused it  . Hydrocodone-Acetaminophen Other (See Comments)    dizziness  . Iodinated Diagnostic Agents   . Oxycodone-Acetaminophen    DISCHARGE MEDICATIONS:   Allergies as of 04/12/2018      Reactions   Other Anaphylaxis   A muscle relaxer in the 70s but cannot remember name of the medication that caused it   Hydrocodone-acetaminophen Other (See Comments)   dizziness   Iodinated Diagnostic Agents    Oxycodone-acetaminophen       Medication List    TAKE these medications     amLODipine 5 MG tablet Commonly known as:  NORVASC Take 1 tablet (5 mg total) by mouth daily.   aspirin 81 MG tablet Take 81 mg by mouth daily.   Biotin 1000 MCG tablet Take 1,000 mcg by mouth daily.   ciprofloxacin 500 MG tablet Commonly known as:  CIPRO Take 1 tablet (500 mg total) by mouth 2 (two) times daily for 10 days.   CRANBERRY PO Take 1 tablet by mouth daily. Skips occasionally   Cyanocobalamin 1000 MCG Tbcr Take 1,000 mcg by mouth daily.   Fish Oil 1000 MG Cpdr Take 1,000 mg by mouth daily.   Vitamin D3 25 MCG (1000 UT) Caps Take 1,000 Units by mouth daily.        DISCHARGE INSTRUCTIONS:  See AVS. If you experience worsening of your admission symptoms, develop shortness of breath, life threatening emergency, suicidal or homicidal thoughts you must seek medical attention immediately by calling 911 or calling your MD immediately  if symptoms less severe.  You Must read complete instructions/literature along with all the possible adverse reactions/side effects for all the Medicines you take and that have been prescribed to you. Take any new Medicines after you have completely understood and accpet all the possible adverse reactions/side effects.   Please note  You were cared for by a hospitalist during your hospital stay. If you have any questions about your discharge medications or the care you received while you were in the hospital after you are discharged, you can call the unit and asked to speak with  the hospitalist on call if the hospitalist that took care of you is not available. Once you are discharged, your primary care physician will handle any further medical issues. Please note that NO REFILLS for any discharge medications will be authorized once you are discharged, as it is imperative that you return to your primary care physician (or establish a relationship with a primary care physician if you do not have one) for your aftercare needs so that they can  reassess your need for medications and monitor your lab values.    On the day of Discharge:  VITAL SIGNS:  Blood pressure 132/70, pulse 65, temperature 98.4 F (36.9 C), temperature source Oral, resp. rate 16, height 5\' 3"  (1.6 m), weight 54.9 kg, SpO2 95 %. PHYSICAL EXAMINATION:  GENERAL:  83 y.o.-year-old patient lying in the bed with no acute distress.  EYES: Pupils equal, round, reactive to light and accommodation. No scleral icterus. Extraocular muscles intact.  HEENT: Head atraumatic, normocephalic. Oropharynx and nasopharynx clear.  NECK:  Supple, no jugular venous distention. No thyroid enlargement, no tenderness.  LUNGS: Normal breath sounds bilaterally, no wheezing, rales,rhonchi or crepitation. No use of accessory muscles of respiration.  CARDIOVASCULAR: S1, S2 normal. No murmurs, rubs, or gallops.  ABDOMEN: Soft, non-tender, non-distended. Bowel sounds present. No organomegaly or mass.  EXTREMITIES: No pedal edema, cyanosis, or clubbing.  NEUROLOGIC: Cranial nerves II through XII are intact. Muscle strength 5/5 in all extremities. Sensation intact. Gait not checked.  PSYCHIATRIC: The patient is alert and oriented x 3.  SKIN: No obvious rash, lesion, or ulcer.  DATA REVIEW:   CBC Recent Labs  Lab 04/11/18 0421 04/12/18 0726  WBC 7.2  --   HGB 9.7* 10.0*  HCT 29.6*  --   PLT 132*  --     Chemistries  Recent Labs  Lab 04/09/18 0550  04/11/18 0421  NA 139   < > 139  K 3.9   < > 3.6  CL 106   < > 111  CO2 25   < > 24  GLUCOSE 130*   < > 106*  BUN 12   < > 13  CREATININE 0.65   < > 0.84  CALCIUM 9.1   < > 7.8*  AST 20  --   --   ALT 12  --   --   ALKPHOS 95  --   --   BILITOT 1.4*  --   --    < > = values in this interval not displayed.     Microbiology Results  Results for orders placed or performed during the hospital encounter of 04/09/18  Urine culture     Status: Abnormal   Collection Time: 04/09/18  5:01 AM  Result Value Ref Range Status    Specimen Description   Final    URINE, RANDOM Performed at [facility], [address] [place], [place] [address]    Special Requests   Final    NONE Performed at [facility], [address] [place], [place] [address]    Culture >=100,000 COLONIES/mL ESCHERICHIA COLI (A)  Final   Report Status 04/11/2018 FINAL  Final   Organism ID, Bacteria ESCHERICHIA COLI (A)  Final      Susceptibility   Escherichia coli - MIC*    AMPICILLIN 4 SENSITIVE Sensitive     CEFAZOLIN <=4 SENSITIVE Sensitive     CEFTRIAXONE <=1 SENSITIVE Sensitive     CIPROFLOXACIN <=0.25 SENSITIVE Sensitive     GENTAMICIN <=1  SENSITIVE Sensitive     IMIPENEM <=0.25 SENSITIVE Sensitive     NITROFURANTOIN <=16 SENSITIVE Sensitive     TRIMETH/SULFA <=20 SENSITIVE Sensitive     AMPICILLIN/SULBACTAM <=2 SENSITIVE Sensitive     PIP/TAZO <=4 SENSITIVE Sensitive     Extended ESBL NEGATIVE Sensitive     * >=100,000 COLONIES/mL ESCHERICHIA COLI    RADIOLOGY:  No results found.   Management plans discussed with the patient, family and they are in agreement.  CODE STATUS: Full Code   TOTAL TIME TAKING CARE OF THIS PATIENT: 32 minutes.    Demetrios Loll M.D on 04/12/2018 at 12:52 PM  Between 7am to 6pm - Pager - [phone]  After 6pm go to [email or web address] - password [email or web address] [facility]  [employer] [place] [facility]  Office  [phone]  CC: Primary care physician; [name], PA-C   Note: This dictation was prepared with Dragon dictation along with smaller phrase technology. Any transcriptional errors that result from this process are unintentional.

## 2018-04-12 NOTE — Plan of Care (Signed)
°  Problem: Education: °Goal: Knowledge of General Education information will improve °Description: Including pain rating scale, medication(s)/side effects and non-pharmacologic comfort measures °Outcome: Progressing °  °Problem: Elimination: °Goal: Will not experience complications related to bowel motility °Outcome: Progressing °Goal: Will not experience complications related to urinary retention °Outcome: Progressing °  °Problem: Pain Managment: °Goal: General experience of comfort will improve °Outcome: Progressing °  °

## 2018-04-12 NOTE — Progress Notes (Signed)
MD order received in [facility] to discharge pt home today; verbally reviewed CHL with pt, gave Rx for Cipro to pt; no questions voiced at this time; pt's discharge pending arrival of her ride home

## 2018-04-13 ENCOUNTER — Telehealth: Payer: Self-pay | Admitting: Urology

## 2018-04-13 ENCOUNTER — Telehealth: Payer: Self-pay

## 2018-04-13 NOTE — Telephone Encounter (Signed)
Patient notified on voicemail that seeing blood in her urine is normal. She is to contact our office if she has trouble urinating.

## 2018-04-13 NOTE — Telephone Encounter (Signed)
HFU apt not scheduled.

## 2018-04-13 NOTE — Telephone Encounter (Signed)
Pt states that she is staring to see blood in her urine, she wants to know if this is normal after stent placement. Please advise. [phone]  she said that if she doesn't answer cell ph you can leave vm on home #.

## 2018-04-13 NOTE — Telephone Encounter (Signed)
I have made the 1st attempt to contact the patient or family member in charge, in order to follow up from recently being discharged from the hospital. I left a message on voicemail requesting a CB. -MM 

## 2018-04-14 ENCOUNTER — Encounter: Payer: Self-pay | Admitting: Emergency Medicine

## 2018-04-14 ENCOUNTER — Other Ambulatory Visit: Payer: Self-pay

## 2018-04-14 DIAGNOSIS — Z7982 Long term (current) use of aspirin: Secondary | ICD-10-CM | POA: Insufficient documentation

## 2018-04-14 DIAGNOSIS — R109 Unspecified abdominal pain: Secondary | ICD-10-CM | POA: Insufficient documentation

## 2018-04-14 DIAGNOSIS — I10 Essential (primary) hypertension: Secondary | ICD-10-CM | POA: Diagnosis not present

## 2018-04-14 DIAGNOSIS — Z79899 Other long term (current) drug therapy: Secondary | ICD-10-CM | POA: Insufficient documentation

## 2018-04-14 NOTE — Telephone Encounter (Signed)
I have made the 2nd attempt to contact the patient or family member in charge, in order to follow up from recently being discharged from the hospital. I left a message on voicemail requesting a CB. -MM  

## 2018-04-14 NOTE — Telephone Encounter (Signed)
Transition Care Management Follow-up Telephone Call  Date of discharge and from where: [facility] on 04/12/18.  How have you been since you were released from the hospital? Doing ok, no better or worse. Has been having back pain and abdominal discomfort. Currently pain level is a 2 and described as a dull ache. Last night pain level was about an 8 but it went away on its on. Pt has had diarrhea due to antibiotics. Urine is pinkish-red but pt was advised by Dr Dene Gentry office that that was normal. Declines fever, blood clots or n/v.   Any questions or concerns? No   Items Reviewed:  Did the pt receive and understand the discharge instructions provided? Yes   Medications obtained and verified? Pt declines reviewing meds at this time.   Any new allergies since your discharge? No   Dietary orders reviewed? Yes  Do you have support at home? Yes   Other (ie: DME, Home Health, etc) N/A  Functional Questionnaire: (I = Independent and D = Dependent)  Bathing/Dressing- I   Meal Prep- I  Eating- I  Maintaining continence- I  Transferring/Ambulation- I  Managing Meds- I   Follow up appointments reviewed:    PCP Hospital f/u appt confirmed? Yes  Scheduled to see Fenton Malling on 04/17/18 @ 11:00 AM.  Are transportation arrangements needed? No   If their condition worsens, is the pt aware to call  their PCP or go to the ED? Yes  Was the patient provided with contact information for the PCP's office or ED? Yes  Was the pt encouraged to call back with questions or concerns? Yes

## 2018-04-14 NOTE — Telephone Encounter (Signed)
Husband called back and states wife is currently in bed and he will have her CB later today. -MM

## 2018-04-14 NOTE — ED Triage Notes (Signed)
Patient to ER for c/o left sided flank pain, was seen here and had surgery for kidney stones on 2/20 with stent placement. States amount of urination has also decreased, until just after arrival to ER (prior to triage), which produced larger amount.

## 2018-04-15 ENCOUNTER — Emergency Department: Payer: Medicare Other

## 2018-04-15 ENCOUNTER — Emergency Department
Admission: EM | Admit: 2018-04-15 | Discharge: 2018-04-15 | Disposition: A | Discharge status: Home / Self Care | Payer: Medicare Other | Attending: Emergency Medicine | Admitting: Emergency Medicine

## 2018-04-15 DIAGNOSIS — R109 Unspecified abdominal pain: Secondary | ICD-10-CM

## 2018-04-15 LAB — CBC
HEMATOCRIT: 32.7 % — AB (ref 36.0–46.0)
Hemoglobin: 11 g/dL — ABNORMAL LOW (ref 12.0–15.0)
MCH: 29.9 pg (ref 26.0–34.0)
MCHC: 33.6 g/dL (ref 30.0–36.0)
MCV: 88.9 fL (ref 80.0–100.0)
Platelets: 222 10*3/uL (ref 150–400)
RBC: 3.68 MIL/uL — AB (ref 3.87–5.11)
RDW: 12.4 % (ref 11.5–15.5)
WBC: 7.1 10*3/uL (ref 4.0–10.5)
nRBC: 0 % (ref 0.0–0.2)

## 2018-04-15 LAB — URINALYSIS, COMPLETE (UACMP) WITH MICROSCOPIC
Bilirubin Urine: NEGATIVE
Glucose, UA: NEGATIVE mg/dL
Ketones, ur: NEGATIVE mg/dL
Nitrite: NEGATIVE
Protein, ur: 30 mg/dL — AB
RBC / HPF: >50 RBC/hpf — ABNORMAL HIGH (ref 0–5)
Specific Gravity, Urine: 1.004 — ABNORMAL LOW (ref 1.005–1.030)
pH: 7 (ref 5.0–8.0)

## 2018-04-15 LAB — BASIC METABOLIC PANEL
Anion gap: 9 (ref 5–15)
BUN: 10 mg/dL (ref 8–23)
CHLORIDE: 106 mmol/L (ref 98–111)
CO2: 26 mmol/L (ref 22–32)
Calcium: 8.7 mg/dL — ABNORMAL LOW (ref 8.9–10.3)
Creatinine, Ser: 0.83 mg/dL (ref 0.44–1.00)
GFR calc Af Amer: >60 mL/min (ref >60)
GFR calc non Af Amer: >60 mL/min (ref >60)
Glucose, Bld: 113 mg/dL — ABNORMAL HIGH (ref 70–99)
Potassium: 3.1 mmol/L — ABNORMAL LOW (ref 3.5–5.1)
Sodium: 141 mmol/L (ref 135–145)

## 2018-04-15 NOTE — ED Notes (Addendum)
Pt in with co left flank pain that started at 1900 hx of kidney stones. Saturday had left ureteral stent placed due obstructing stone. States has had multiple episodes of kidney stones. States has had bloody urine today, no fever.

## 2018-04-15 NOTE — ED Provider Notes (Signed)
[facility] Emergency Department Provider Note   First MD Initiated Contact with Patient 04/15/18 0303     (approximate)  I have reviewed the triage vital signs and the nursing notes.   HISTORY  Chief Complaint Flank Pain    HPI Gina Middleton is a 83 y.o. female with below list of chronic medical conditions including recently placed left urethral stent secondary to kidney stone presents to the emergency department with cute onset of 9 out of 10 left flank pain.  Patient also admits to hematuria since stent placement however today before arrival to the emergency department patient states that she had the urge to urinate but was unable to do so.  Upon arrival to the emergency department patient states she had a large void of urine.  Patient states that she did not notice any clots at that time.  Pain resolved at this time.   Past Medical History:  Diagnosis Date  . Hypertension   . Renal disorder     Patient Active Problem List   Diagnosis Date Noted  . Renal stone 04/09/2018  . Kidney stone 04/09/2018  . Achilles tendonitis 05/12/2015  . Allergic rhinitis 09/20/2014  . Benign paroxysmal positional nystagmus 09/20/2014  . Blood glucose elevated 09/20/2014  . Neuralgia neuritis, sciatic nerve 09/20/2014  . Vegetarian 09/20/2014  . B12 deficiency 09/20/2014  . Avitaminosis D 09/20/2014  . Hypertension 09/07/2014    Past Surgical History:  Procedure Laterality Date  . APPENDECTOMY    . [place]  . CHOLECYSTECTOMY    . CYSTOSCOPY W/ RETROGRADES Left 04/09/2018   Procedure: CYSTOSCOPY WITH RETROGRADE PYELOGRAM;  Surgeon: Abbie Sons, MD;  Location: [facility] ORS;  Service: Urology;  Laterality: Left;  . CYSTOSCOPY WITH STENT PLACEMENT Left 04/09/2018   Procedure: CYSTOSCOPY WITH STENT PLACEMENT-LEFT;  Surgeon: Abbie Sons, MD;  Location: [facility] ORS;  Service: Urology;  Laterality: Left;  . CYSTOSCOPY WITH URETEROSCOPY Left  04/09/2018   Procedure: POSSIBLE URETEROSCOPY-LEFT;  Surgeon: Abbie Sons, MD;  Location: [facility] ORS;  Service: Urology;  Laterality: Left;    Prior to Admission medications   Medication Sig Start Date End Date Taking? Authorizing Provider  amLODipine (NORVASC) 5 MG tablet Take 1 tablet (5 mg total) by mouth daily. 09/12/17   [name], PA-C  aspirin 81 MG tablet Take 81 mg by mouth daily.  02/16/10   [provider]  Biotin 1000 MCG tablet Take 1,000 mcg by mouth daily.    [provider]  Cholecalciferol (VITAMIN D3) 25 MCG (1000 UT) CAPS Take 1,000 Units by mouth daily.     [provider]  ciprofloxacin (CIPRO) 500 MG tablet Take 1 tablet (500 mg total) by mouth 2 (two) times daily for 10 days. 04/12/18 04/22/18  Demetrios Loll, MD  CRANBERRY PO Take 1 tablet by mouth daily. Skips occasionally    [provider]  Cyanocobalamin 1000 MCG TBCR Take 1,000 mcg by mouth daily.  05/13/11   [provider]  Omega-3 Fatty Acids (FISH OIL) 1000 MG CPDR Take 1,000 mg by mouth daily.  02/16/10   [provider]    Allergies Other; Hydrocodone-acetaminophen; Iodinated diagnostic agents; and Oxycodone-acetaminophen  Family History  Problem Relation Age of Onset  . Hypertension Mother   . Alzheimer's disease Mother   . Heart attack Father        x's 3  . Diabetes Sister   . Heart attack Sister   . Breast cancer  Maternal Grandmother     Social History Social History   Tobacco Use  . Smoking status: Never Smoker  . Smokeless tobacco: Never Used  Substance Use Topics  . Alcohol use: Not on file  . Drug use: No    Review of Systems Constitutional: No fever/chills Eyes: No visual changes. ENT: No sore throat. Cardiovascular: Denies chest pain. Respiratory: Denies shortness of breath. Gastrointestinal: No abdominal pain.  No nausea, no vomiting.  No diarrhea.  No constipation.  Positive for left flank pain Genitourinary:  Negative for dysuria. Musculoskeletal: Negative for neck pain.  Negative for back pain. Integumentary: Negative for rash. Neurological: Negative for headaches, focal weakness or numbness.   ____________________________________________   PHYSICAL EXAM:  VITAL SIGNS: ED Triage Vitals  Enc Vitals Group     BP 04/14/18 2345 (!) 156/78     Pulse Rate 04/14/18 2345 89     Resp --      Temp 04/14/18 2345 98.3 F (36.8 C)     Temp src --      SpO2 04/14/18 2345 97 %     Weight 04/14/18 2348 54.9 kg (121 lb 0.5 oz)     Height 04/14/18 2348 1.6 m (5\' 3" )     Head Circumference --      Peak Flow --      Pain Score 04/14/18 2348 7     Pain Loc --      Pain Edu? --      Excl. in [place]? --     Constitutional: Alert and oriented. Well appearing and in no acute distress. Eyes: Conjunctivae are normal. Mouth/Throat: Mucous membranes are moist. Oropharynx non-erythematous. Neck: No stridor.   Cardiovascular: Normal rate, regular rhythm. Good peripheral circulation. Grossly normal heart sounds. Respiratory: Normal respiratory effort.  No retractions. Lungs CTAB. Gastrointestinal: Soft and nontender. No distention.  Musculoskeletal: No lower extremity tenderness nor edema. No gross deformities of extremities. Neurologic:  Normal speech and language. No gross focal neurologic deficits are appreciated.  Skin:  Skin is warm, dry and intact. No rash noted. Psychiatric: Mood and affect are normal. Speech and behavior are normal.  ____________________________________________   LABS (all labs ordered are listed, but only abnormal results are displayed)  Labs Reviewed  URINALYSIS, COMPLETE (UACMP) WITH MICROSCOPIC - Abnormal; Notable for the following components:      Result Value   Color, Urine YELLOW (*)    APPearance CLEAR (*)    Specific Gravity, Urine 1.004 (*)    Hgb urine dipstick LARGE (*)    Protein, ur 30 (*)    Leukocytes,Ua TRACE (*)    RBC / HPF >50 (*)    Bacteria, UA RARE  (*)    All other components within normal limits  BASIC METABOLIC PANEL - Abnormal; Notable for the following components:   Potassium 3.1 (*)    Glucose, Bld 113 (*)    Calcium 8.7 (*)    All other components within normal limits  CBC - Abnormal; Notable for the following components:   RBC 3.68 (*)    Hemoglobin 11.0 (*)    HCT 32.7 (*)    All other components within normal limits   _________________  RADIOLOGY I, [place] N , personally viewed and evaluated these images (plain radiographs) as part of my medical decision making, as well as reviewing the written report by the radiologist.  ED MD interpretation: Interval placement of left-sided pigtail urethral stent with persistent mild left hydronephrosis no definitive stone identified per radiologist.  Official radiology report(s): Ct Renal Stone Study  Result Date: 04/15/2018 CLINICAL DATA:  83 year old female with left flank pain and hematuria. EXAM: CT ABDOMEN AND PELVIS WITHOUT CONTRAST TECHNIQUE: Multidetector CT imaging of the abdomen and pelvis was performed following the standard protocol without IV contrast. COMPARISON:  CT of the abdomen pelvis dated 04/09/2018. FINDINGS: Evaluation of this exam is limited in the absence of intravenous contrast. Lower chest: There are linear bibasilar atelectasis/scarring. There is mild cardiomegaly. There is hypoattenuation of the cardiac blood pool suggestive of a degree of anemia. Clinical correlation is recommended. No intra-abdominal free air. Small free fluid within the pelvis. Hepatobiliary: Subcentimeter hypodense focus in the dome of the liver is too small to characterize. The liver is otherwise unremarkable. Cholecystectomy. No intrahepatic biliary ductal dilatation. Pancreas: Unremarkable. No pancreatic ductal dilatation or surrounding inflammatory changes. Spleen: Normal in size without focal abnormality. Adrenals/Urinary Tract: The adrenal glands are unremarkable. There has been  interval placement of a left sided pigtail ureteral stent with proximal tip in the mid to lower pole collecting system and distal end in the urinary bladder. There is persistent mild left hydronephrosis, not significantly changed since the prior CT. No definite stone identified in the left kidney or along the course of the left ureteral stent. There is slight stranding of the fat surrounding the left ureter. Several nonobstructing right renal calculi measure 3-4 mm. Right renal upper pole cyst. The visualized right ureter is unremarkable. The urinary bladder is collapsed. Stomach/Bowel: There is sigmoid diverticulosis without active inflammatory changes. There is no bowel obstruction or active inflammation. Appendectomy. Vascular/Lymphatic: Mild aortoiliac atherosclerotic disease. No portal venous gas. There is no adenopathy. Reproductive: The uterus is anteverted and grossly unremarkable. No adnexal masses. Other: None Musculoskeletal: Degenerative changes of the spine. Severe bilateral hip osteoarthritis, right worse than left. No acute osseous pathology. IMPRESSION: 1. Interval placement of a left-sided pigtail ureteral stent. Persistent mild left hydronephrosis. No definite stone identified in the left kidney or along the course of the left ureteral stent. 2. Small nonobstructing right renal calculi. No hydronephrosis. 3. Sigmoid diverticulosis. No bowel obstruction or active inflammation. Electronically Signed   By: Anner Crete M.D.   On: 04/15/2018 03:40     Procedures   ____________________________________________   INITIAL IMPRESSION / MDM / [place] / ED COURSE  As part of my medical decision making, I reviewed the following data within the electronic MEDICAL RECORD NUMBER   83 year old female presented with above-stated history and physical exam secondary to left flank pain following urethral stent placement.  Consider possibility of stent obstruction however I suspect this to be  unlikely given the patient's pain is resolved.  In addition consider the possibility of urinary retention secondary to clot followed by resolution.  CT revealed no acute findings and as such I favor that the patient's discomfort was secondary to urinary retention most likely secondary to a clot.  Patient advised to follow-up with Dr. Bernardo Heater today ____________________________________________  FINAL CLINICAL IMPRESSION(S) / ED DIAGNOSES  Final diagnoses:  Left flank pain     MEDICATIONS GIVEN DURING THIS VISIT:  Medications - No data to display   ED Discharge Orders    None       Note:  This document was prepared using Dragon voice recognition software and may include unintentional dictation errors.   Gregor Hams, MD 04/15/18 [phone]

## 2018-04-15 NOTE — ED Notes (Signed)
Pt to CT

## 2018-04-15 NOTE — ED Notes (Signed)
Patient discharged to home per MD order. Patient in stable condition, and deemed medically cleared by ED provider for discharge. Discharge instructions reviewed with patient/family using "Teach Back"; verbalized understanding of medication education and administration, and information about follow-up care. Denies further concerns. ° °

## 2018-04-17 ENCOUNTER — Ambulatory Visit: Payer: Medicare Other | Admitting: Physician Assistant

## 2018-04-17 ENCOUNTER — Encounter: Payer: Self-pay | Admitting: Physician Assistant

## 2018-04-17 VITALS — BP 145/77 | HR 71 | Temp 98.1°F | Resp 16 | Wt 123.0 lb

## 2018-04-17 DIAGNOSIS — N3001 Acute cystitis with hematuria: Secondary | ICD-10-CM

## 2018-04-17 DIAGNOSIS — N132 Hydronephrosis with renal and ureteral calculous obstruction: Secondary | ICD-10-CM | POA: Diagnosis not present

## 2018-04-17 DIAGNOSIS — N200 Calculus of kidney: Secondary | ICD-10-CM

## 2018-04-17 DIAGNOSIS — IMO0001 Reserved for inherently not codable concepts without codable children: Secondary | ICD-10-CM

## 2018-04-17 DIAGNOSIS — Z9189 Other specified personal risk factors, not elsewhere classified: Secondary | ICD-10-CM

## 2018-04-17 NOTE — Progress Notes (Signed)
Patient: Gina Middleton Female    DOB: 1935/11/19   83 y.o.   MRN: [id number] Visit Date: 04/17/2018  Today's Provider: [name], PA-C   Chief Complaint  Patient presents with  . Hospitalization Follow-up   Subjective:     HPI  Follow Up ER Visit  Patient is here for ER follow up.  She was recently seen at [facility] for recurrent left Flank on 04/15/2018. Treatment for this included CT and urinary retention. She reports excellent compliance with treatment. She reports this condition is Improved. Reports that she still has pain.  Patient was also admitted on 04/09/2018 and discharge on 04/12/2018 for the same issue and kidney stone.  Transition of care call was done 04/13/2018.  Treated for Hydronephrosis and Kidney Stone; procedure on  2/20 stone removed and stent placed by Dr. Bernardo Heater.  She was treated withIV fluids andIV Rocephin,PRN pain  control  Urine culture:E coli, sensitive to many antibiotics.  Change to  p.o. Cipro for 10 more days following discharge. Now  completed.  Has f/u scheduled with Dr. Bernardo Heater on 04/27/18.   Allergies  Allergen Reactions  . Other Anaphylaxis    A muscle relaxer in the 70s but cannot remember name of the medication that caused it  . Hydrocodone-Acetaminophen Other (See Comments)    dizziness  . Iodinated Diagnostic Agents   . Oxycodone-Acetaminophen      Current Outpatient Medications:  .  amLODipine (NORVASC) 5 MG tablet, Take 1 tablet (5 mg total) by mouth daily., Disp: 90 tablet, Rfl: 1 .  aspirin 81 MG tablet, Take 81 mg by mouth daily. , Disp: , Rfl:  .  Biotin 1000 MCG tablet, Take 1,000 mcg by mouth daily., Disp: , Rfl:  .  Cholecalciferol (VITAMIN D3) 25 MCG (1000 UT) CAPS, Take 1,000 Units by mouth daily. , Disp: , Rfl:  .  ciprofloxacin (CIPRO) 500 MG tablet, Take 1 tablet (500 mg total) by mouth 2 (two) times daily for 10 days., Disp: 20 tablet, Rfl: 0 .  CRANBERRY PO, Take 1 tablet by mouth daily.  Skips occasionally, Disp: , Rfl:  .  Cyanocobalamin 1000 MCG TBCR, Take 1,000 mcg by mouth daily. , Disp: , Rfl:  .  Omega-3 Fatty Acids (FISH OIL) 1000 MG CPDR, Take 1,000 mg by mouth daily. , Disp: , Rfl:   Review of Systems  Constitutional: Negative.   Respiratory: Negative.   Cardiovascular: Negative.   Gastrointestinal: Negative.   Genitourinary: Positive for frequency and hematuria. Negative for dysuria, flank pain and urgency.       Intermittent suprapubic pain/pressure  Musculoskeletal: Negative for back pain.    Social History   Tobacco Use  . Smoking status: Never Smoker  . Smokeless tobacco: Never Used  Substance Use Topics  . Alcohol use: Not on file      Objective:   BP (!) 145/77 (BP Location: Left Arm, Patient Position: Sitting, Cuff Size: Normal)   Pulse 71   Temp 98.1 F (36.7 C) (Oral)   Resp 16   Wt 123 lb (55.8 kg)   BMI 21.79 kg/m  Vitals:   04/17/18 1104  BP: (!) 145/77  Pulse: 71  Resp: 16  Temp: 98.1 F (36.7 C)  TempSrc: Oral  Weight: 123 lb (55.8 kg)     Physical Exam Vitals signs reviewed.  Constitutional:      General: She is not in acute distress.    Appearance: Normal appearance. She is well-developed.  She is not diaphoretic.  Neck:     Musculoskeletal: Normal range of motion and neck supple.     Thyroid: No thyromegaly.     Vascular: No JVD.     Trachea: No tracheal deviation.  Cardiovascular:     Rate and Rhythm: Normal rate and regular rhythm.     Heart sounds: Normal heart sounds. No murmur. No friction rub. No gallop.   Pulmonary:     Effort: Pulmonary effort is normal. No respiratory distress.     Breath sounds: Normal breath sounds. No wheezing or rales.  Abdominal:     General: Bowel sounds are normal. There is no distension.     Palpations: Abdomen is soft. There is no mass.     Tenderness: There is no abdominal tenderness. There is no guarding or rebound.     Comments: Suprapubic pressure  Lymphadenopathy:      Cervical: No cervical adenopathy.  Skin:    General: Skin is warm and dry.  Neurological:     Mental Status: She is alert and oriented to person, place, and time.         Assessment & Plan    1. Transition of care performed with sharing of clinical summary Hospital notes, labs, and images from 04/09/18-04/12/18 and 04/15/18 ER visit reviewed. Improving. Continue Cipro until completed. Keep scheduled appt with Dr. Bernardo Heater on 04/27/18 for stent removal. Call if symptoms worsen.   2. Kidney stone See above medical treatment plan.  3. Hydronephrosis with urinary obstruction due to ureteral calculus See above medical treatment plan.  4. Acute cystitis with hematuria See above medical treatment plan.     [name], PA-C  [facility]

## 2018-04-17 NOTE — Patient Instructions (Signed)
Dietary Guidelines to Help Prevent Kidney Stones Kidney stones are deposits of minerals and salts that form inside your kidneys. Your risk of developing kidney stones may be greater depending on your diet, your lifestyle, the medicines you take, and whether you have certain medical conditions. Most people can reduce their chances of developing kidney stones by following the instructions below. Depending on your overall health and the type of kidney stones you tend to develop, your dietitian may give you more specific instructions. What are tips for following this plan? Reading food labels  Choose foods with "no salt added" or "low-salt" labels. Limit your sodium intake to less than 1500 mg per day.  Choose foods with calcium for each meal and snack. Try to eat about 300 mg of calcium at each meal. Foods that contain 200-500 mg of calcium per serving include: ? 8 oz (237 ml) of milk, fortified nondairy milk, and fortified fruit juice. ? 8 oz (237 ml) of kefir, yogurt, and soy yogurt. ? 4 oz (118 ml) of tofu. ? 1 oz of cheese. ? 1 cup (300 g) of dried figs. ? 1 cup (91 g) of cooked broccoli. ? 1-3 oz can of sardines or mackerel.  Most people need 1000 to 1500 mg of calcium each day. Talk to your dietitian about how much calcium is recommended for you. Shopping  Buy plenty of fresh fruits and vegetables. Most people do not need to avoid fruits and vegetables, even if they contain nutrients that may contribute to kidney stones.  When shopping for convenience foods, choose: ? Whole pieces of fruit. ? Premade salads with dressing on the side. ? Low-fat fruit and yogurt smoothies.  Avoid buying frozen meals or prepared deli foods.  Look for foods with live cultures, such as yogurt and kefir. Cooking  Do not add salt to food when cooking. Place a salt shaker on the table and allow each person to add his or her own salt to taste.  Use vegetable protein, such as beans, textured vegetable  protein (TVP), or tofu instead of meat in pasta, casseroles, and soups. Meal planning   Eat less salt, if told by your dietitian. To do this: ? Avoid eating processed or premade food. ? Avoid eating fast food.  Eat less animal protein, including cheese, meat, poultry, or fish, if told by your dietitian. To do this: ? Limit the number of times you have meat, poultry, fish, or cheese each week. Eat a diet free of meat at least 2 days a week. ? Eat only one serving each day of meat, poultry, fish, or seafood. ? When you prepare animal protein, cut pieces into small portion sizes. For most meat and fish, one serving is about the size of one deck of cards.  Eat at least 5 servings of fresh fruits and vegetables each day. To do this: ? Keep fruits and vegetables on hand for snacks. ? Eat 1 piece of fruit or a handful of berries with breakfast. ? Have a salad and fruit at lunch. ? Have two kinds of vegetables at dinner.  Limit foods that are high in a substance called oxalate. These include: ? Spinach. ? Rhubarb. ? Beets. ? Potato chips and french fries. ? Nuts.  If you regularly take a diuretic medicine, make sure to eat at least 1-2 fruits or vegetables high in potassium each day. These include: ? Avocado. ? Banana. ? Orange, prune, carrot, or tomato juice. ? Baked potato. ? Cabbage. ? Beans and split   If you regularly take a diuretic medicine, make sure to eat at least 1-2 fruits or vegetables high in potassium each day. These include:  ? Avocado.  ? Banana.  ? Orange, prune, carrot, or tomato juice.  ? Baked potato.  ? Cabbage.  ? Beans and split peas.  General instructions     Drink enough fluid to keep your urine clear or pale yellow. This is the most important thing you can do.   Talk to your health care provider and dietitian about taking daily supplements. Depending on your health and the cause of your kidney stones, you may be advised:  ? Not to take supplements with vitamin C.  ? To take a calcium supplement.  ? To take a daily probiotic supplement.  ? To take other supplements such as magnesium, fish oil, or vitamin B6.   Take all medicines and supplements as told by your health care provider.   Limit alcohol intake to no  more than 1 drink a day for nonpregnant women and 2 drinks a day for men. One drink equals 12 oz of beer, 5 oz of wine, or 1 oz of hard liquor.   Lose weight if told by your health care provider. Work with your dietitian to find strategies and an eating plan that works best for you.  What foods are not recommended?  Limit your intake of the following foods, or as told by your dietitian. Talk to your dietitian about specific foods you should avoid based on the type of kidney stones and your overall health.  Grains  Breads. Bagels. Rolls. Baked goods. Salted crackers. Cereal. Pasta.  Vegetables  Spinach. Rhubarb. Beets. Canned vegetables. Pickles. Olives.  Meats and other protein foods  Nuts. Nut butters. Large portions of meat, poultry, or fish. Salted or cured meats. Deli meats. Hot dogs. Sausages.  Dairy  Cheese.  Beverages  Regular soft drinks. Regular vegetable juice.  Seasonings and other foods  Seasoning blends with salt. Salad dressings. Canned soups. Soy sauce. Ketchup. Barbecue sauce. Canned pasta sauce. Casseroles. Pizza. Lasagna. Frozen meals. Potato chips. French fries.  Summary   You can reduce your risk of kidney stones by making changes to your diet.   The most important thing you can do is drink enough fluid. You should drink enough fluid to keep your urine clear or pale yellow.   Ask your health care provider or dietitian how much protein from animal sources you should eat each day, and also how much salt and calcium you should have each day.  This information is not intended to replace advice given to you by your health care provider. Make sure you discuss any questions you have with your health care provider.  Document Released: 06/01/2010 Document Revised: 01/16/2016 Document Reviewed: 01/16/2016  Elsevier Interactive Patient Education  2019 [employer]

## 2018-04-27 ENCOUNTER — Ambulatory Visit: Payer: Medicare Other | Admitting: Urology

## 2018-04-27 ENCOUNTER — Encounter: Payer: Self-pay | Admitting: Urology

## 2018-04-27 VITALS — BP 142/68 | HR 74 | Ht 63.0 in | Wt 123.0 lb

## 2018-04-27 DIAGNOSIS — M1610 Unilateral primary osteoarthritis, unspecified hip: Secondary | ICD-10-CM | POA: Insufficient documentation

## 2018-04-27 DIAGNOSIS — Z9889 Other specified postprocedural states: Secondary | ICD-10-CM

## 2018-04-27 DIAGNOSIS — Z87442 Personal history of urinary calculi: Secondary | ICD-10-CM

## 2018-04-27 LAB — CALCULI, WITH PHOTOGRAPH (CLINICAL LAB)
Calcium Oxalate Dihydrate: 100 %
Weight Calculi: 4 mg

## 2018-04-27 LAB — URINALYSIS, COMPLETE
Bilirubin, UA: NEGATIVE
Glucose, UA: NEGATIVE
NITRITE UA: POSITIVE — AB
Specific Gravity, UA: 1.025 (ref 1.005–1.030)
Urobilinogen, Ur: 1 mg/dL (ref 0.2–1.0)
pH, UA: 5.5 (ref 5.0–7.5)

## 2018-04-27 LAB — MICROSCOPIC EXAMINATION
Bacteria, UA: NONE SEEN
EPITHELIAL CELLS (NON RENAL): NONE SEEN /HPF (ref 0–10)

## 2018-04-27 MED ORDER — LIDOCAINE HCL URETHRAL/MUCOSAL 2 % EX GEL: 1.0000 "application " | Freq: Once | CUTANEOUS | Status: DC

## 2018-04-27 NOTE — Progress Notes (Signed)
Indications: Patient is 83 y.o., female status post left ureteroscopic stone removal on 04/09/2018.  She has experienced intermittent stent irritation.  The patient is presenting today for stent removal.  Procedure:  Flexible Cystoscopy with stent removal (36067)  Timeout was performed and the correct patient, procedure and participants were identified.    Description:  The patient was prepped and draped in the usual sterile fashion. Flexible cystosopy was performed.  The stent was visualized, grasped, and removed intact without difficulty. The patient tolerated the procedure well.  A single dose of oral antibiotics was given.  Complications:  None  Plan: Follow-up visit approximately 1 month

## 2018-04-27 NOTE — Patient Instructions (Signed)

## 2018-05-02 LAB — CULTURE, URINE COMPREHENSIVE

## 2018-05-04 ENCOUNTER — Telehealth: Payer: Self-pay

## 2018-05-04 NOTE — Telephone Encounter (Signed)
Left pt detailed message per [facility]

## 2018-05-04 NOTE — Telephone Encounter (Signed)
-----   Message from Abbie Sons, MD sent at 05/03/2018 12:55 PM EDT ----- Urine culture grew a low level of staph which most likely represents colonization.  Please check to see if she has any UTI symptoms.

## 2018-05-05 ENCOUNTER — Telehealth: Payer: Self-pay | Admitting: Urology

## 2018-05-05 NOTE — Telephone Encounter (Signed)
Patient called back and stated she was not having any UTI symptoms  Sharyn Lull

## 2018-06-02 ENCOUNTER — Other Ambulatory Visit: Payer: Self-pay | Admitting: Physician Assistant

## 2018-06-02 DIAGNOSIS — I10 Essential (primary) hypertension: Secondary | ICD-10-CM

## 2018-06-09 ENCOUNTER — Ambulatory Visit: Payer: Medicare Other | Admitting: Urology

## 2018-06-09 ENCOUNTER — Other Ambulatory Visit: Payer: Self-pay

## 2018-06-09 ENCOUNTER — Encounter: Payer: Self-pay | Admitting: Urology

## 2018-06-09 ENCOUNTER — Other Ambulatory Visit: Payer: Self-pay | Admitting: Urology

## 2018-06-09 VITALS — BP 139/62 | HR 73 | Ht 63.0 in | Wt 123.0 lb

## 2018-06-09 DIAGNOSIS — N200 Calculus of kidney: Secondary | ICD-10-CM | POA: Diagnosis not present

## 2018-06-09 MED ORDER — TRAMADOL HCL 50 MG PO TABS: 50.0000 mg | ORAL_TABLET | Freq: Four times a day (QID) | ORAL | 0 refills | Status: DC | PRN

## 2018-06-09 NOTE — Progress Notes (Signed)
06/09/2018 2:02 PM   Gina Middleton 1935/07/07 [id number]  Referring provider: [name], PA-C [place], [place] [address]  Chief Complaint  Patient presents with  . Follow-up    HPI: 83 year old female status post ureteroscopic removal of a 3 mm left distal ureteral calculus on 04/09/2018.  Her CT did show right renal calculi.  Her stent was removed on 04/27/2018 and she has been asymptomatic since stent removal.  CT did show small nonobstructing right renal calculi.  She passed a stone in 2018 and had 1 additional stone approximately 40 years ago which she passed.  Stone analysis was 100% calcium oxalate dihydrate.   PMH: Past Medical History:  Diagnosis Date  . Hypertension   . Kidney stone   . Renal disorder     Surgical History: Past Surgical History:  Procedure Laterality Date  . APPENDECTOMY    . [place]  . CHOLECYSTECTOMY    . CYSTOSCOPY W/ RETROGRADES Left 04/09/2018   Procedure: CYSTOSCOPY WITH RETROGRADE PYELOGRAM;  Surgeon: Abbie Sons, MD;  Location: [facility];  Service: Urology;  Laterality: Left;  . CYSTOSCOPY WITH STENT PLACEMENT Left 04/09/2018   Procedure: CYSTOSCOPY WITH STENT PLACEMENT-LEFT;  Surgeon: Abbie Sons, MD;  Location: [facility];  Service: Urology;  Laterality: Left;  . CYSTOSCOPY WITH URETEROSCOPY Left 04/09/2018   Procedure: POSSIBLE URETEROSCOPY-LEFT;  Surgeon: Abbie Sons, MD;  Location: [facility];  Service: Urology;  Laterality: Left;    Home Medications:  Allergies as of 06/09/2018      Reactions   Other Anaphylaxis   A muscle relaxer in the 70s but cannot remember name of the medication that caused it   Hydrocodone-acetaminophen Other (See Comments)   dizziness   Iodinated Diagnostic Agents    Oxycodone-acetaminophen       Medication List       Accurate as of June 09, 2018  2:02 PM. Always use your most recent med list.        amLODipine 5 MG tablet Commonly  known as:  NORVASC TAKE 1 TABLET(5 MG) BY MOUTH DAILY   aspirin 81 MG tablet Take 81 mg by mouth daily.   Biotin 1000 MCG tablet Take 1,000 mcg by mouth daily.   CRANBERRY PO Take 1 tablet by mouth daily. Skips occasionally   Cyanocobalamin 1000 MCG Tbcr Take 1,000 mcg by mouth daily.   Fish Oil 1000 MG Cpdr Take 1,000 mg by mouth daily.   Vitamin D3 25 MCG (1000 UT) Caps Take 1,000 Units by mouth daily.       Allergies:  Allergies  Allergen Reactions  . Other Anaphylaxis    A muscle relaxer in the 70s but cannot remember name of the medication that caused it  . Hydrocodone-Acetaminophen Other (See Comments)    dizziness  . Iodinated Diagnostic Agents   . Oxycodone-Acetaminophen     Family History: Family History  Problem Relation Age of Onset  . Hypertension Mother   . Alzheimer's disease Mother   . Heart attack Father        x's 3  . Diabetes Sister   . Heart attack Sister   . Breast cancer Maternal Grandmother     Social History:  reports that she has never smoked. She has never used smokeless tobacco. She reports that she does not use drugs. No history on file for alcohol.  ROS: UROLOGY Frequent Urination?: No Hard to postpone urination?: No Burning/pain with urination?: No Get  up at night to urinate?: No Leakage of urine?: No Urine stream starts and stops?: No Trouble starting stream?: No Do you have to strain to urinate?: No Blood in urine?: No Urinary tract infection?: No Sexually transmitted disease?: No Injury to kidneys or bladder?: No Painful intercourse?: No Weak stream?: No Currently pregnant?: No Vaginal bleeding?: No Last menstrual period?: Postmenopausal  Gastrointestinal Nausea?: No Vomiting?: No Indigestion/heartburn?: No Diarrhea?: No Constipation?: No  Constitutional Fever: No Night sweats?: No Weight loss?: No Fatigue?: No  Skin Skin rash/lesions?: No Itching?: No  Eyes Blurred vision?: No Double vision?: No   Ears/Nose/Throat Sore throat?: No Sinus problems?: No  Hematologic/Lymphatic Swollen glands?: No Easy bruising?: No  Cardiovascular Leg swelling?: No Chest pain?: No  Respiratory Cough?: No Shortness of breath?: No  Endocrine Excessive thirst?: No  Musculoskeletal Back pain?: No Joint pain?: Yes  Neurological Headaches?: No Dizziness?: No  Psychologic Depression?: No Anxiety?: No  Physical Exam: BP 139/62 (BP Location: Left Arm, Patient Position: Sitting, Cuff Size: Normal)   Pulse 73   Ht 5\' 3"  (1.6 m)   Wt 123 lb (55.8 kg)   BMI 21.79 kg/m   Constitutional:  Alert and oriented, No acute distress. HEENT: [place] AT, moist mucus membranes.  Trachea midline, no masses. Cardiovascular: No clubbing, cyanosis, or edema. Respiratory: Normal respiratory effort, no increased work of breathing. Skin: No rashes, bruises or suspicious lesions. Neurologic: Grossly intact, no focal deficits, moving all 4 extremities. Psychiatric: Normal mood and affect.   Assessment & Plan:   Doing well status post ureteroscopic removal of a small distal left ureteral calculus.  She is a recurrent stone former and does have nonobstructing right renal calculi.  I did discuss a metabolic evaluation including a 24-hour urine study and blood work however she did not desire at this time until the COVID-19 pandemic subsides.  She was given literature on general stone prevention including increasing her water intake keep urine output greater than 2 L/day and dietary oxalate moderation.  She was also concerned about going to the ED for recurrent stone pain during the COVID-19 pandemic.  She did not have any more pain medication and a limited supply of tramadol was sent to her pharmacy.  Return in about 4 months (around 10/09/2018) for KUB.   Abbie Sons, [place] [address] [place], [place] [address] [phone]

## 2018-07-21 DIAGNOSIS — C4491 Basal cell carcinoma of skin, unspecified: Secondary | ICD-10-CM

## 2018-07-21 HISTORY — DX: Basal cell carcinoma of skin, unspecified: C44.91

## 2018-09-14 ENCOUNTER — Encounter: Payer: Self-pay | Admitting: Physician Assistant

## 2018-09-14 ENCOUNTER — Ambulatory Visit
Admission: RE | Admit: 2018-09-14 | Discharge: 2018-09-14 | Disposition: A | Discharge status: Home / Self Care | Payer: Medicare Other | Source: Ambulatory Visit | Attending: Physician Assistant | Admitting: Physician Assistant

## 2018-09-14 ENCOUNTER — Ambulatory Visit (INDEPENDENT_AMBULATORY_CARE_PROVIDER_SITE_OTHER): Payer: Medicare Other | Admitting: Physician Assistant

## 2018-09-14 ENCOUNTER — Other Ambulatory Visit: Payer: Self-pay | Admitting: Physician Assistant

## 2018-09-14 ENCOUNTER — Other Ambulatory Visit: Payer: Self-pay

## 2018-09-14 VITALS — BP 128/66 | HR 76 | Ht 63.0 in | Wt 122.0 lb

## 2018-09-14 DIAGNOSIS — N200 Calculus of kidney: Secondary | ICD-10-CM | POA: Diagnosis present

## 2018-09-14 DIAGNOSIS — R300 Dysuria: Secondary | ICD-10-CM | POA: Diagnosis present

## 2018-09-14 DIAGNOSIS — N12 Tubulo-interstitial nephritis, not specified as acute or chronic: Secondary | ICD-10-CM | POA: Diagnosis not present

## 2018-09-14 LAB — MICROSCOPIC EXAMINATION: WBC, UA: >30 /hpf — AB (ref 0–5)

## 2018-09-14 LAB — URINALYSIS, COMPLETE
Bilirubin, UA: NEGATIVE
Glucose, UA: NEGATIVE
Nitrite, UA: NEGATIVE
Specific Gravity, UA: 1.02 (ref 1.005–1.030)
Urobilinogen, Ur: 1 mg/dL (ref 0.2–1.0)
pH, UA: 5.5 (ref 5.0–7.5)

## 2018-09-14 MED ORDER — CIPROFLOXACIN HCL 500 MG PO TABS: 500.0000 mg | ORAL_TABLET | Freq: Two times a day (BID) | ORAL | 0 refills | Status: AC

## 2018-09-14 NOTE — Progress Notes (Signed)
09/14/2018 2:05 PM   Gina Middleton 1935/05/15 [id number]  CC: Dysuria, right flank pain, fever, nausea, chills  HPI: Gina Middleton is a 83 y.o. female with recurrent nephrolithiasis who presents today with complaints of dysuria, fever, nausea, and chills. She is an established [facility] patient who last saw Dr. Bernardo Heater on 06/09/2018 for follow up s/p left ureteroscopy with stone removal on 04/09/2018. Follow up CT stone study on 04/15/2018 showed several nonobstructing right renal calculi, 3-16mm in size.  Today, she reports a 4-day history of right flank pain and dysuria with fever to >101F, vomiting, and chills. Has taken Tylenol at home with palliation of symptoms. She began to defervesce yesterday and has been tolerating PO fluid and food intake. She does not believe she has passed a stone and denies gross hematuria.  Urine culture in March 2020 with low CFUs Staph species believed to represent colonization. She has not taken antibiotics in the past 90 days.  In office UA today positive for leukocyte esterase, protein, blood, and ketones; microscopy with >30WBC/HPF, 11-30 RBC/HPF, and moderate bacteria. KUB today with no obstructing stones visualized on the right side. Urine culture pending.  PMH: Past Medical History:  Diagnosis Date  . Hypertension   . Kidney stone   . Renal disorder     Surgical History: Past Surgical History:  Procedure Laterality Date  . APPENDECTOMY    . [place]  . CHOLECYSTECTOMY    . CYSTOSCOPY W/ RETROGRADES Left 04/09/2018   Procedure: CYSTOSCOPY WITH RETROGRADE PYELOGRAM;  Surgeon: Abbie Sons, MD;  Location: [facility];  Service: Urology;  Laterality: Left;  . CYSTOSCOPY WITH STENT PLACEMENT Left 04/09/2018   Procedure: CYSTOSCOPY WITH STENT PLACEMENT-LEFT;  Surgeon: Abbie Sons, MD;  Location: [facility];  Service: Urology;  Laterality: Left;  . CYSTOSCOPY WITH URETEROSCOPY Left 04/09/2018   Procedure: POSSIBLE  URETEROSCOPY-LEFT;  Surgeon: Abbie Sons, MD;  Location: [facility];  Service: Urology;  Laterality: Left;    Home Medications:  Allergies as of 09/14/2018      Reactions   Other Anaphylaxis   A muscle relaxer in the 70s but cannot remember name of the medication that caused it   Hydrocodone-acetaminophen Other (See Comments)   dizziness   Iodinated Diagnostic Agents    Oxycodone-acetaminophen       Medication List       Accurate as of September 14, 2018  2:05 PM. If you have any questions, ask your nurse or doctor.        amLODipine 5 MG tablet Commonly known as: NORVASC TAKE 1 TABLET(5 MG) BY MOUTH DAILY   aspirin 81 MG tablet Take 81 mg by mouth daily.   Biotin 1000 MCG tablet Take 1,000 mcg by mouth daily.   CRANBERRY PO Take 1 tablet by mouth daily. Skips occasionally   Cyanocobalamin 1000 MCG Tbcr Take 1,000 mcg by mouth daily.   Fish Oil 1000 MG Cpdr Take 1,000 mg by mouth daily.   traMADol 50 MG tablet Commonly known as: ULTRAM Take 1 tablet (50 mg total) by mouth every 6 (six) hours as needed for moderate pain.   Vitamin D3 25 MCG (1000 UT) Caps Take 1,000 Units by mouth daily.       Allergies:  Allergies  Allergen Reactions  . Other Anaphylaxis    A muscle relaxer in the 70s but cannot remember name of the medication that caused it  . Hydrocodone-Acetaminophen Other (See Comments)    dizziness  .  Iodinated Diagnostic Agents   . Oxycodone-Acetaminophen     Family History: Family History  Problem Relation Age of Onset  . Hypertension Mother   . Alzheimer's disease Mother   . Heart attack Father        x's 3  . Diabetes Sister   . Heart attack Sister   . Breast cancer Maternal Grandmother     Social History:   reports that she has never smoked. She has never used smokeless tobacco. She reports that she does not use drugs. No history on file for alcohol.  ROS: UROLOGY Frequent Urination?: No Hard to postpone urination?: No  Burning/pain with urination?: Yes Get up at night to urinate?: No Leakage of urine?: No Urine stream starts and stops?: No Trouble starting stream?: No Do you have to strain to urinate?: No Blood in urine?: No Urinary tract infection?: No Sexually transmitted disease?: No Injury to kidneys or bladder?: No Painful intercourse?: No Weak stream?: No Currently pregnant?: No Vaginal bleeding?: No Last menstrual period?: n  Gastrointestinal Nausea?: Yes Vomiting?: Yes Indigestion/heartburn?: No Diarrhea?: No Constipation?: No  Constitutional Fever: Yes Night sweats?: No Weight loss?: No Fatigue?: No  Skin Skin rash/lesions?: No Itching?: No  Eyes Blurred vision?: No Double vision?: No  Ears/Nose/Throat Sore throat?: No Sinus problems?: No  Hematologic/Lymphatic Swollen glands?: No Easy bruising?: No  Cardiovascular Leg swelling?: No Chest pain?: No  Respiratory Cough?: No Shortness of breath?: No  Endocrine Excessive thirst?: No  Musculoskeletal Back pain?: No Joint pain?: No  Neurological Headaches?: Yes Dizziness?: Yes  Psychologic Depression?: No Anxiety?: No  Physical Exam: BP 128/66   Pulse 76   Ht 5\' 3"  (1.6 m)   Wt 122 lb (55.3 kg)   BMI 21.61 kg/m   Constitutional:  Alert and oriented, No acute distress. Non toxic appearing. Cardiovascular: No clubbing, cyanosis, or edema. Respiratory: Normal respiratory effort, no increased work of breathing. GU: + RIGHT CVA tenderness, no LEFT CVA tenderness Skin: No rashes, bruises or suspicious lesions. Neurologic: Grossly intact, no focal deficits, moving all 4 extremities. Psychiatric: Normal mood and affect.  Laboratory Data: Urinalysis    Component Value Date/Time   COLORURINE YELLOW (A) 04/14/2018 2354   APPEARANCEUR Cloudy (A) 09/14/2018 1400   LABSPEC 1.004 (L) 04/14/2018 2354   PHURINE 7.0 04/14/2018 2354   GLUCOSEU Negative 09/14/2018 1400   HGBUR LARGE (A) 04/14/2018 2354    BILIRUBINUR Negative 09/14/2018 1400   KETONESUR NEGATIVE 04/14/2018 2354   PROTEINUR 3+ (A) 09/14/2018 1400   PROTEINUR 30 (A) 04/14/2018 2354   UROBILINOGEN 0.2 11/04/2014 1336   NITRITE Negative 09/14/2018 1400   NITRITE NEGATIVE 04/14/2018 2354   LEUKOCYTESUR 3+ (A) 09/14/2018 1400   LEUKOCYTESUR TRACE (A) 04/14/2018 2354   Results for orders placed or performed in visit on 09/14/18  Microscopic Examination     Status: Abnormal   Collection Time: 09/14/18  2:00 PM   URINE  Result Value Ref Range Status   WBC, UA >30 (A) 0 - 5 /hpf Final   RBC 11-30 (A) 0 - 2 /hpf Final   Epithelial Cells (non renal) 0-10 0 - 10 /hpf Final   Renal Epithel, UA 0-10 (A) None seen /hpf Final   Bacteria, UA Moderate (A) None seen/Few Final   Pertinent Imaging: KUB, 09/14/2018: IMPRESSION: 1. Interval removal of a LEFT-sided nephroureteral stent. No LEFT-sided renal or ureteral calculi seen. 2. No RIGHT-sided renal or ureteral stone is seen on today's plain film. There were multiple small stones visualized  on earlier CT abdomen of 04/15/2018. 3. No acute findings.   Electronically Signed   By: Franki Cabot M.D.   On: 09/14/2018 20:23  Renal US, 09/14/2018: IMPRESSION: 1. Mild left hydronephrosis. 2. No right-sided hydronephrosis. Suspected 6 mm nonobstructing stone in the interpolar right kidney. 3. Bilateral renal cysts.   Electronically Signed   By: Logan Bores M.D.   On: 09/14/2018 16:51  I personally reviewed the images referenced above and agree with the radiologic findings.  Assessment & Plan:   1. Acute pyelonephritis Patient with positive UA, unilateral right CVA tenderness, and symptoms consistent with acute pyelonephritis. Patient is nontoxic appearing, vitals are WNL, and she is tolerating PO well at this time; thus, I do not believe she requires inpatient treatment at this time.  I will initiate antibiotic therapy today. I counseled her that if her fever continues  >72 hours after initiation of antibiotics, then she should call our office so that we may escalate treatment. -Urinalysis, complete -Urine culture -Cipro 500mg  BID x7 days  2. PMH recurrent nephrolithiasis No obstructing stones visualized on KUB today, however given patient's PMH, I would like further imaging of her kidneys to rule out upper urinary tract obstruction as contributing to her current infection. Renal US ordered today. I counseled her that we would be in touch with her results and that swelling in her kidney may warrant a stent to both clear the infection and bypass any obstruction. She expressed an understanding of this plan.  Renal US result with no hydronephrosis on the right (mild hydronephrosis on the left considered incidental given patient's right-sided symptoms). Low concern for upper tract obstruction at this time. Will proceed with antibiotic therapy alone as above. -STAT renal US  Odaly Peri, [facility]  [place] [address] [place], [place] [address] [phone]

## 2018-09-15 ENCOUNTER — Telehealth: Payer: Self-pay | Admitting: Physician Assistant

## 2018-09-15 NOTE — Telephone Encounter (Signed)
Pt called and I read her the note from Sam, she states that she will let us know if she does not feel better in 3 days.

## 2018-09-15 NOTE — Telephone Encounter (Signed)
Please contact the patient and inform her that her renal ultrasound did not show any swelling in her right kidney, so I do not believe there is a stone contributing to her current infection. She should continue with her antibiotics as planned and let us know if she does not begin to feel better within 3 days.

## 2018-09-17 LAB — CULTURE, URINE COMPREHENSIVE

## 2018-10-09 ENCOUNTER — Ambulatory Visit
Admission: RE | Admit: 2018-10-09 | Discharge: 2018-10-09 | Disposition: A | Discharge status: Home / Self Care | Payer: Medicare Other | Source: Ambulatory Visit | Attending: Urology | Admitting: Urology

## 2018-10-09 ENCOUNTER — Other Ambulatory Visit: Payer: Self-pay

## 2018-10-09 ENCOUNTER — Ambulatory Visit (INDEPENDENT_AMBULATORY_CARE_PROVIDER_SITE_OTHER): Payer: Medicare Other | Admitting: Urology

## 2018-10-09 ENCOUNTER — Encounter: Payer: Self-pay | Admitting: Urology

## 2018-10-09 VITALS — BP 136/66 | HR 72 | Ht 63.0 in | Wt 122.0 lb

## 2018-10-09 DIAGNOSIS — N200 Calculus of kidney: Secondary | ICD-10-CM

## 2018-10-09 DIAGNOSIS — Z87442 Personal history of urinary calculi: Secondary | ICD-10-CM | POA: Diagnosis not present

## 2018-10-09 DIAGNOSIS — N12 Tubulo-interstitial nephritis, not specified as acute or chronic: Secondary | ICD-10-CM | POA: Diagnosis not present

## 2018-10-09 LAB — URINALYSIS, COMPLETE
Bilirubin, UA: NEGATIVE
Glucose, UA: NEGATIVE
Ketones, UA: NEGATIVE
Leukocytes,UA: NEGATIVE
Nitrite, UA: NEGATIVE
RBC, UA: NEGATIVE
Specific Gravity, UA: 1.02 (ref 1.005–1.030)
Urobilinogen, Ur: 0.2 mg/dL (ref 0.2–1.0)
pH, UA: 5.5 (ref 5.0–7.5)

## 2018-10-09 LAB — MICROSCOPIC EXAMINATION
Bacteria, UA: NONE SEEN
RBC, Urine: NONE SEEN /hpf (ref 0–2)

## 2018-10-09 NOTE — Progress Notes (Signed)
10/09/2018 1:03 PM   Gina Middleton Mar 04, 1935 [id number]  Referring provider: [name], PA-C [place],  [place] [address]  Chief Complaint  Patient presents with  . Nephrolithiasis    Follow up    Urologic history: 1.  Nephrolithiasis  -Ureteroscopic removal 3 mm left distal ureteral calculus 03/2018  -CT showed small 3-4 mm nonobstructing right renal calculi   HPI: 83 y.o. female presents for follow-up of nephrolithiasis.  She did see 1 of our PAs in late July complaining of dysuria, right flank pain, fever, nausea and chills.  Urinalysis showed pyuria and urine culture did grow Proteus which was treated.  A renal ultrasound was performed that showed no hydronephrosis.  Possible nonobstructing right renal calculus was noted.  She has no complaints today and states her symptoms resolved on antibiotic therapy.   PMH: Past Medical History:  Diagnosis Date  . Hypertension   . Kidney stone   . Renal disorder     Surgical History: Past Surgical History:  Procedure Laterality Date  . APPENDECTOMY    . [place]  . CHOLECYSTECTOMY    . CYSTOSCOPY W/ RETROGRADES Left 04/09/2018   Procedure: CYSTOSCOPY WITH RETROGRADE PYELOGRAM;  Surgeon: Abbie Sons, MD;  Location: [facility];  Service: Urology;  Laterality: Left;  . CYSTOSCOPY WITH STENT PLACEMENT Left 04/09/2018   Procedure: CYSTOSCOPY WITH STENT PLACEMENT-LEFT;  Surgeon: Abbie Sons, MD;  Location: [facility];  Service: Urology;  Laterality: Left;  . CYSTOSCOPY WITH URETEROSCOPY Left 04/09/2018   Procedure: POSSIBLE URETEROSCOPY-LEFT;  Surgeon: Abbie Sons, MD;  Location: [facility];  Service: Urology;  Laterality: Left;    Home Medications:  Allergies as of 10/09/2018      Reactions   Other Anaphylaxis   A muscle relaxer in the 70s but cannot remember name of the medication that caused it   Hydrocodone-acetaminophen Other (See Comments)   dizziness   Iodinated Diagnostic Agents    Oxycodone-acetaminophen       Medication List       Accurate as of October 09, 2018  1:03 PM. If you have any questions, ask your nurse or doctor.        STOP taking these medications   traMADol 50 MG tablet Commonly known as: ULTRAM Stopped by: Abbie Sons, MD     TAKE these medications   amLODipine 5 MG tablet Commonly known as: NORVASC TAKE 1 TABLET(5 MG) BY MOUTH DAILY   aspirin 81 MG tablet Take 81 mg by mouth daily.   Biotin 1000 MCG tablet Take 1,000 mcg by mouth daily.   CRANBERRY PO Take 1 tablet by mouth daily. Skips occasionally   Cyanocobalamin 1000 MCG Tbcr Take 1,000 mcg by mouth daily.   Fish Oil 1000 MG Cpdr Take 1,000 mg by mouth daily.   Vitamin D3 25 MCG (1000 UT) Caps Take 1,000 Units by mouth daily.       Allergies:  Allergies  Allergen Reactions  . Other Anaphylaxis    A muscle relaxer in the 70s but cannot remember name of the medication that caused it  . Hydrocodone-Acetaminophen Other (See Comments)    dizziness  . Iodinated Diagnostic Agents   . Oxycodone-Acetaminophen     Family History: Family History  Problem Relation Age of Onset  . Hypertension Mother   . Alzheimer's disease Mother   . Heart attack Father        x's 3  . Diabetes Sister   .  Heart attack Sister   . Breast cancer Maternal Grandmother     Social History:  reports that she has never smoked. She has never used smokeless tobacco. She reports that she does not use drugs. No history on file for alcohol.  ROS: UROLOGY Frequent Urination?: No Hard to postpone urination?: No Burning/pain with urination?: No Get up at night to urinate?: No Leakage of urine?: No Urine stream starts and stops?: No Trouble starting stream?: No Do you have to strain to urinate?: No Blood in urine?: No Urinary tract infection?: No Sexually transmitted disease?: No Injury to kidneys or bladder?: No Painful intercourse?: No Weak stream?:  No Currently pregnant?: No Vaginal bleeding?: No Last menstrual period?: n  Gastrointestinal Nausea?: No Vomiting?: No Indigestion/heartburn?: No Diarrhea?: No Constipation?: No  Constitutional Fever: No Night sweats?: No Weight loss?: No Fatigue?: No  Skin Skin rash/lesions?: No Itching?: No  Eyes Blurred vision?: No Double vision?: No  Ears/Nose/Throat Sore throat?: No Sinus problems?: No  Hematologic/Lymphatic Swollen glands?: No Easy bruising?: No  Cardiovascular Leg swelling?: No Chest pain?: No  Respiratory Cough?: No Shortness of breath?: No  Endocrine Excessive thirst?: No  Musculoskeletal Back pain?: No Joint pain?: Yes  Neurological Headaches?: No Dizziness?: No  Psychologic Depression?: No Anxiety?: No  Physical Exam: BP 136/66   Pulse 72   Ht 5\' 3"  (1.6 m)   Wt 122 lb (55.3 kg)   BMI 21.61 kg/m   Constitutional:  Alert and oriented, No acute distress. HEENT: [place] AT, moist mucus membranes.  Trachea midline, no masses. Cardiovascular: No clubbing, cyanosis, or edema. Respiratory: Normal respiratory effort, no increased work of breathing. GI: Abdomen is soft, nontender, nondistended, no abdominal masses GU: No CVA tenderness Lymph: No cervical or inguinal lymphadenopathy. Skin: No rashes, bruises or suspicious lesions. Neurologic: Grossly intact, no focal deficits, moving all 4 extremities. Psychiatric: Normal mood and affect.  Laboratory Data:  Urinalysis Dipstick trace protein Microscopy negative  Assessment & Plan:    - Right nephrolithiasis Recent renal ultrasound showed possible small right renal calculus and a prior CT did show 3-4 mm nonobstructing right renal calculi.  KUB performed today shows significant amount of stool and bowel gas which obscures the renal outlines.  Repeat urinalysis today showed resolution of prior infection.  Recommend annual follow-up with KUB and she was instructed to call earlier for  recurrent flank pain or UTI symptoms.   Abbie Sons, [place] [address] [place], [place] [address] [phone]

## 2018-10-14 ENCOUNTER — Other Ambulatory Visit: Payer: Self-pay

## 2018-10-14 ENCOUNTER — Ambulatory Visit (INDEPENDENT_AMBULATORY_CARE_PROVIDER_SITE_OTHER): Payer: Medicare Other | Admitting: Physician Assistant

## 2018-10-14 ENCOUNTER — Encounter: Payer: Self-pay | Admitting: Physician Assistant

## 2018-10-14 VITALS — BP 110/82 | HR 76 | Temp 97.3°F | Resp 18 | Wt 115.0 lb

## 2018-10-14 DIAGNOSIS — M1611 Unilateral primary osteoarthritis, right hip: Secondary | ICD-10-CM | POA: Diagnosis not present

## 2018-10-14 NOTE — Patient Instructions (Signed)
Voltaren gel   Hip Pain  The hip is the joint between the upper legs and the lower pelvis. The bones, cartilage, tendons, and muscles of your hip joint support your body and allow you to move around. Hip pain can range from a minor ache to severe pain in one or both of your hips. The pain may be felt on the inside of the hip joint near the groin, or the outside near the buttocks and upper thigh. You may also have swelling or stiffness. Follow these instructions at home: Managing pain, stiffness, and swelling  If directed, apply ice to the injured area. ? Put ice in a plastic bag. ? Place a towel between your skin and the bag. ? Leave the ice on for 20 minutes, 2-3 times a day  Sleep with a pillow between your legs on your most comfortable side.  Avoid any activities that cause pain. General instructions  Take over-the-counter and prescription medicines only as told by your health care provider.  Do any exercises as told by your health care provider.  Record the following: ? How often you have hip pain. ? The location of your pain. ? What the pain feels like. ? What makes the pain worse.  Keep all follow-up visits as told by your health care provider. This is important. Contact a health care provider if:  You cannot put weight on your leg.  Your pain or swelling continues or gets worse after one week.  It gets harder to walk.  You have a fever. Get help right away if:  You fall.  You have a sudden increase in pain and swelling in your hip.  Your hip is red or swollen or very tender to touch. Summary  Hip pain can range from a minor ache to severe pain in one or both of your hips.  The pain may be felt on the inside of the hip joint near the groin, or the outside near the buttocks and upper thigh.  Avoid any activities that cause pain.  Record how often you have hip pain, the location of the pain, what makes it worse and what it feels like. This information is  not intended to replace advice given to you by your health care provider. Make sure you discuss any questions you have with your health care provider. Document Released: 07/25/2009 Document Revised: 01/17/2017 Document Reviewed: 01/08/2016 Elsevier Patient Education  2020 [employer].

## 2018-10-14 NOTE — Progress Notes (Signed)
Patient: Gina Middleton Female    DOB: 1935-12-30   83 y.o.   MRN: [id number] Visit Date: 10/14/2018  Today's Provider: [name], PA-C   No chief complaint on file.  Subjective:     HPI Right hip pain for 3 months. She reports pain is present most often with weight bearing and walking. Occasionally bothers her with sitting as well. Has a lot of pain when going from sitting to standing.    Allergies  Allergen Reactions  . Other Anaphylaxis    A muscle relaxer in the 70s but cannot remember name of the medication that caused it  . Hydrocodone-Acetaminophen Other (See Comments)    dizziness  . Iodinated Diagnostic Agents   . Oxycodone-Acetaminophen      Current Outpatient Medications:  .  amLODipine (NORVASC) 5 MG tablet, TAKE 1 TABLET(5 MG) BY MOUTH DAILY, Disp: 90 tablet, Rfl: 1 .  aspirin 81 MG tablet, Take 81 mg by mouth daily. , Disp: , Rfl:  .  Biotin 1000 MCG tablet, Take 1,000 mcg by mouth daily., Disp: , Rfl:  .  Cholecalciferol (VITAMIN D3) 25 MCG (1000 UT) CAPS, Take 1,000 Units by mouth daily. , Disp: , Rfl:  .  CRANBERRY PO, Take 1 tablet by mouth daily. Skips occasionally, Disp: , Rfl:  .  Cyanocobalamin 1000 MCG TBCR, Take 1,000 mcg by mouth daily. , Disp: , Rfl:  .  Omega-3 Fatty Acids (FISH OIL) 1000 MG CPDR, Take 1,000 mg by mouth daily. , Disp: , Rfl:   Current Facility-Administered Medications:  .  lidocaine (XYLOCAINE) 2 % jelly 1 application, 1 application, Urethral, Once, Stoioff, Scott C, MD  Review of Systems  Constitutional: Negative.   Respiratory: Negative.   Cardiovascular: Negative.   Gastrointestinal: Negative.   Musculoskeletal: Positive for arthralgias and gait problem.  Neurological: Negative for weakness and numbness.    Social History   Tobacco Use  . Smoking status: Never Smoker  . Smokeless tobacco: Never Used  Substance Use Topics  . Alcohol use: Not on file      Objective:   BP 110/82 (BP Location:  Left Arm, Patient Position: Sitting, Cuff Size: Normal)   Pulse 76   Temp (!) 97.3 F (36.3 C) (Temporal)   Resp 18   Wt 115 lb (52.2 kg)   SpO2 97%   BMI 20.37 kg/m  Vitals:   10/14/18 1343  BP: 110/82  Pulse: 76  Resp: 18  Temp: (!) 97.3 F (36.3 C)  TempSrc: Temporal  SpO2: 97%  Weight: 115 lb (52.2 kg)     Physical Exam Vitals signs reviewed.  Constitutional:      General: She is not in acute distress.    Appearance: Normal appearance. She is well-developed. She is not ill-appearing or diaphoretic.  Neck:     Musculoskeletal: Normal range of motion and neck supple.  Cardiovascular:     Rate and Rhythm: Normal rate and regular rhythm.     Heart sounds: Normal heart sounds. No murmur. No friction rub. No gallop.   Pulmonary:     Effort: Pulmonary effort is normal. No respiratory distress.     Breath sounds: Normal breath sounds. No wheezing or rales.  Musculoskeletal:     Right hip: She exhibits decreased range of motion and tenderness. She exhibits normal strength, no bony tenderness, no swelling, no crepitus, no deformity and no laceration.  Neurological:     Mental Status: She is alert.  There is an xray of the abdomen from 10/09/18 where the hips were visualized. It is noted there is arthritic changes bilaterally, with the right showing severe changes. Images were viewed in PACS.  CLINICAL DATA:  Nephrolithiasis, history of urolithiasis, most recently 09/14/2018  EXAM: ABDOMEN - 1 VIEW  COMPARISON:  Radiograph 09/14/2018, renal ultrasound 09/14/2018  FINDINGS: No radiopaque calculi are visualized along the course of either ureter. Multiple rounded phleboliths in the pelvis are unchanged from prior. The bowel gas pattern is nonobstructive.  Multilevel discogenic and facet degenerative changes are present the lumbar spine with severe bilateral hip osteoarthrosis and moderate degenerative changes of the SI joints and symphysis pubis.  IMPRESSION: No  radiopaque calculi visualized along the course of either ureter.   Electronically Signed   By: Lovena Le M.D.   On: 10/09/2018 16:50  No results found for any visits on 10/14/18.     Assessment & Plan    1. Arthritis of right hip Referral placed to orthopedics, Dr. Harlow Mares (preferable) for severe OA of the right hip. She wishes to continue OTC IBU for now. Call if worsening.      [name], PA-C  [facility]

## 2018-10-22 ENCOUNTER — Telehealth: Payer: Self-pay | Admitting: Physician Assistant

## 2018-10-22 DIAGNOSIS — M1611 Unilateral primary osteoarthritis, right hip: Secondary | ICD-10-CM

## 2018-10-22 NOTE — Telephone Encounter (Signed)
Pt called wanting to know if Tawanna Sat has sent in a referral to a specialist regarding arthritis in her hip  CB#  [id number]

## 2018-10-22 NOTE — Telephone Encounter (Signed)
Referral placed.

## 2018-10-22 NOTE — Telephone Encounter (Signed)
Please advise 

## 2018-10-23 ENCOUNTER — Encounter: Payer: Self-pay | Admitting: Physician Assistant

## 2018-10-27 ENCOUNTER — Telehealth: Payer: Self-pay | Admitting: Physician Assistant

## 2018-10-29 ENCOUNTER — Telehealth: Payer: Self-pay | Admitting: Physician Assistant

## 2018-10-29 NOTE — Telephone Encounter (Signed)
Pt needing the Xray of hip to take to an office visit.  Please call pt back at [phone] ext [phone]. Pt would like to pick it up.  Please advise.  Thanks, [employer]

## 2018-10-29 NOTE — Telephone Encounter (Signed)
LMOVM for pt to return call 

## 2018-10-29 NOTE — Telephone Encounter (Signed)
Please advise 

## 2018-10-29 NOTE — Telephone Encounter (Signed)
She needs to contact radiology department

## 2018-10-30 ENCOUNTER — Telehealth: Payer: Self-pay

## 2018-11-03 NOTE — Telephone Encounter (Signed)
Encounter error

## 2018-12-16 ENCOUNTER — Other Ambulatory Visit: Payer: Self-pay | Admitting: Physician Assistant

## 2018-12-16 DIAGNOSIS — I10 Essential (primary) hypertension: Secondary | ICD-10-CM

## 2019-01-01 ENCOUNTER — Other Ambulatory Visit: Payer: Self-pay | Admitting: Physician Assistant

## 2019-01-01 DIAGNOSIS — I10 Essential (primary) hypertension: Secondary | ICD-10-CM

## 2019-02-08 ENCOUNTER — Telehealth: Payer: Self-pay

## 2019-02-08 NOTE — Telephone Encounter (Signed)
Pt Gina Middleton stating she needed to cancel all apts until after 03/05/19. Currently pt is scheduled for an AWV and CPE on 02/26/19.

## 2019-02-15 NOTE — Telephone Encounter (Signed)
Spoke with husband who states pt is not available. Advised him I had cancelled both her AWV and CPE per her previous request. Husband states he will have her CB to r/s. My direct number left to CB [phone].

## 2019-02-16 NOTE — Telephone Encounter (Signed)
Scheduled telephonic AWV for 03/11/19 @ 2:00 PM and an a CPE 03/15/19 @ 2:00 PM.

## 2019-02-26 ENCOUNTER — Ambulatory Visit: Payer: Medicare Other

## 2019-02-26 ENCOUNTER — Encounter: Payer: Medicare Other | Admitting: Physician Assistant

## 2019-03-10 NOTE — Progress Notes (Signed)
Subjective:   Gina Middleton is a 84 y.o. female who presents for Medicare Annual (Subsequent) preventive examination.    This visit is being conducted through telemedicine due to the COVID-19 pandemic. This patient has given me verbal consent via doximity to conduct this visit, patient states they are participating from their home address. Some vital signs may be absent or patient reported.    Patient identification: identified by name, DOB, and current address  Review of Systems:  N/A  Cardiac Risk Factors include: advanced age (>39men, >71 women);hypertension     Objective:     Vitals: There were no vitals taken for this visit.  There is no height or weight on file to calculate BMI. Unable to obtain vitals due to visit being conducted via telephonically.   Advanced Directives 03/11/2019 04/15/2018 04/09/2018 02/24/2018 04/01/2016 02/08/2016 05/12/2015  Does Patient Have a Medical Advance Directive? Yes Yes Yes Yes No No No  Type of Paramedic of [place];Living will [place];Living will Living will [place];Living will - - -  Does patient want to make changes to medical advance directive? - - No - Patient declined - - - -  Copy of [place] in Chart? No - copy requested - - No - copy requested - - -    Tobacco Social History   Tobacco Use  Smoking Status Never Smoker  Smokeless Tobacco Never Used     Counseling given: Not Answered   Clinical Intake:  Pre-visit preparation completed: Yes  Pain : 0-10 Pain Score: 0-No pain Pain Type: Chronic pain Pain Location: Hip Pain Orientation: Right Pain Descriptors / Indicators: Aching Pain Frequency: Constant     Nutritional Risks: None Diabetes: No  How often do you need to have someone help you when you read instructions, pamphlets, or other written materials from your doctor or pharmacy?: 1 - Never  Interpreter Needed?:  No  Information entered by :: [facility], LPN  Past Medical History:  Diagnosis Date  . Hypertension   . Kidney stone   . Renal disorder    Past Surgical History:  Procedure Laterality Date  . APPENDECTOMY    . [place]  . CHOLECYSTECTOMY    . CYSTOSCOPY W/ RETROGRADES Left 04/09/2018   Procedure: CYSTOSCOPY WITH RETROGRADE PYELOGRAM;  Surgeon: Abbie Sons, MD;  Location: [facility] ORS;  Service: Urology;  Laterality: Left;  . CYSTOSCOPY WITH STENT PLACEMENT Left 04/09/2018   Procedure: CYSTOSCOPY WITH STENT PLACEMENT-LEFT;  Surgeon: Abbie Sons, MD;  Location: [facility] ORS;  Service: Urology;  Laterality: Left;  . CYSTOSCOPY WITH URETEROSCOPY Left 04/09/2018   Procedure: POSSIBLE URETEROSCOPY-LEFT;  Surgeon: Abbie Sons, MD;  Location: [facility] ORS;  Service: Urology;  Laterality: Left;   Family History  Problem Relation Age of Onset  . Hypertension Mother   . Alzheimer's disease Mother   . Heart attack Father        x's 3  . Diabetes Sister   . Heart attack Sister   . Breast cancer Maternal Grandmother    Social History   Socioeconomic History  . Marital status: Married    Spouse name: Kyung Rudd  . Number of children: 0  . Years of education: College  . Highest education level: Master's degree (e.g., MA, MS, MEng, MEd, MSW, MBA)  Occupational History  . Occupation: Retired Programmer, multimedia  . Occupation: Works part-time at [employer] and [employer]  . Occupation: [employer]  Employer: [employer]    Comment: Part Time  Tobacco Use  . Smoking status: Never Smoker  . Smokeless tobacco: Never Used  Substance and Sexual Activity  . Alcohol use: Not Currently  . Drug use: No  . Sexual activity: Never  Other Topics Concern  . Not on file  Social History Narrative  . Not on file   Social Determinants of Health   Financial Resource Strain: Low Risk   . Difficulty of Paying Living Expenses: Not hard at all  Food Insecurity: No Food Insecurity  .  Worried About Charity fundraiser in the Last Year: Never true  . Ran Out of Food in the Last Year: Never true  Transportation Needs: No Transportation Needs  . Lack of Transportation (Medical): No  . Lack of Transportation (Non-Medical): No  Physical Activity: Inactive  . Days of Exercise per Week: 0 days  . Minutes of Exercise per Session: 0 min  Stress: No Stress Concern Present  . Feeling of Stress : Only a little  Social Connections: Somewhat Isolated  . Frequency of Communication with Friends and Family: Once a week  . Frequency of Social Gatherings with Friends and Family: Once a week  . Attends Religious Services: More than 4 times per year  . Active Member of Clubs or Organizations: No  . Attends Archivist Meetings: Never  . Marital Status: Married    Outpatient Encounter Medications as of 03/11/2019  Medication Sig  . amLODipine (NORVASC) 5 MG tablet TAKE 1 TABLET(5 MG) BY MOUTH DAILY  . aspirin 81 MG tablet Take 81 mg by mouth daily.   . Biotin 1000 MCG tablet Take 1,000 mcg by mouth daily. occasionally  . Cholecalciferol (VITAMIN D3) 25 MCG (1000 UT) CAPS Take 1,000 Units by mouth daily.   Marland Kitchen CRANBERRY PO Take 1 tablet by mouth daily. Skips occasionally  . Cyanocobalamin 1000 MCG TBCR Take 1,000 mcg by mouth daily. occasionally  . meloxicam (MOBIC) 7.5 MG tablet Take 7.5 mg by mouth daily.   . Omega-3 Fatty Acids (FISH OIL) 1000 MG CPDR Take 1,000 mg by mouth daily.    Facility-Administered Encounter Medications as of 03/11/2019  Medication  . lidocaine (XYLOCAINE) 2 % jelly 1 application    Activities of Daily Living In your present state of health, do you have any difficulty performing the following activities: 03/11/2019 04/09/2018  Hearing? N N  Vision? Y N  Comment Due to blurred vision from leakage behind the left eye. -  Difficulty concentrating or making decisions? N N  Walking or climbing stairs? Y N  Comment Due to hip pain. -  Dressing or  bathing? N N  Doing errands, shopping? N N  Preparing Food and eating ? N -  Using the Toilet? N -  In the past six months, have you accidently leaked urine? N -  Do you have problems with loss of bowel control? N -  Managing your Medications? N -  Managing your Finances? N -  Housekeeping or managing your Housekeeping? N -  Some recent data might be hidden    Patient Care Team: [name] as PCP - General (Family Medicine) Ralene Bathe, MD (Dermatology) Thornton Park, MD as Referring Physician (Orthopedic Surgery) Abbie Sons, MD (Urology) Eulogio Bear, MD as Consulting Physician (Ophthalmology)    Assessment:   This is a routine wellness examination for Gina Middleton.  Exercise Activities and Dietary recommendations Current Exercise Habits: The patient  does not participate in regular exercise at present, Exercise limited by: orthopedic condition(s)  Goals    . DIET - INCREASE WATER INTAKE     Recommend to drink at least 6-8 8oz glasses of water per day.       Fall Risk: Fall Risk  03/11/2019 02/24/2018 02/12/2017 08/28/2016 02/08/2016  Falls in the past year? 0 0 No No No  Number falls in past yr: 0 0 - - -  Injury with Fall? 0 0 - - -    FALL RISK PREVENTION PERTAINING TO THE HOME:  Any stairs in or around the home? Yes  If so, are there any without handrails? No   Home free of loose throw rugs in walkways, pet beds, electrical cords, etc? Yes  Adequate lighting in your home to reduce risk of falls? Yes   ASSISTIVE DEVICES UTILIZED TO PREVENT FALLS:  Life alert? No  Use of a cane, walker or w/c? Yes  Grab bars in the bathroom? Yes  Shower chair or bench in shower? Yes  Elevated toilet seat or a handicapped toilet? Yes    TIMED UP AND GO:  Was the test performed? No .    Depression Screen PHQ 2/9 Scores 03/11/2019 03/11/2019 02/24/2018 02/24/2018  PHQ - 2 Score 0 0 0 0  PHQ- 9 Score - - 0 -      Cognitive Function: Declined today.        6CIT Screen 02/24/2018  What Year? 0 points  What month? 0 points  What time? 0 points  Count back from 20 0 points  Months in reverse 0 points  Repeat phrase 0 points  Total Score 0    Immunization History  Administered Date(s) Administered  . Pneumococcal Conjugate-13 08/28/2016  . Pneumococcal Polysaccharide-23 09/12/2017  . Td 05/10/1995    Qualifies for Shingles Vaccine? Yes . Due for Shingrix. Pt has been advised to call insurance company to determine out of pocket expense. Advised may also receive vaccine at local pharmacy or Health Dept. Verbalized acceptance and understanding.  Tdap: Up to date  Flu Vaccine: Due for Flu vaccine. Does the patient want to receive this vaccine today?  No . Advised may receive this vaccine at local pharmacy or Health Dept. Aware to provide a copy of the vaccination record if obtained from local pharmacy or Health Dept. Verbalized acceptance and understanding.  Pneumococcal Vaccine: Completed series  Screening Tests Health Maintenance  Topic Date Due  . INFLUENZA VACCINE  05/19/2019 (Originally 09/19/2018)  . TETANUS/TDAP  03/10/2020 (Originally 05/09/2005)  . DEXA SCAN  02/08/2020  . PNA vac Low Risk Adult  Completed    Cancer Screenings:  Colorectal Screening: No longer required.   Mammogram: No longer required.   Bone Density: Completed 02/08/15. Results reflect OSTEOPENIA. Repeat every 5 years.   Lung Cancer Screening: (Low Dose CT Chest recommended if Age 72-80 years, 30 pack-year currently smoking OR have quit w/in 15years.) does not qualify.   Additional Screening:  Vision Screening: Recommended annual ophthalmology exams for early detection of glaucoma and other disorders of the eye.  Dental Screening: Recommended annual dental exams for proper oral hygiene  Community Resource Referral:  CRR required this visit?  No       Plan:  I have personally reviewed and addressed the Medicare Annual Wellness questionnaire  and have noted the following in the patient's chart:  A. Medical and social history B. Use of alcohol, tobacco or illicit drugs  C. Current medications and supplements  D. Functional ability and status E.  Nutritional status F.  Physical activity G. Advance directives H. List of other physicians I.  Hospitalizations, surgeries, and ER visits in previous 12 months J.  [place] such as hearing and vision if needed, cognitive and depression L. Referrals and appointments   In addition, I have reviewed and discussed with patient certain preventive protocols, quality metrics, and best practice recommendations. A written personalized care plan for preventive services as well as general preventive health recommendations were provided to patient. Nurse Health Advisor  Signed,    Castiel Lauricella [place], [place]  [phone] Nurse Health Advisor   Nurse Notes: Pt declined a future flu shot.

## 2019-03-11 ENCOUNTER — Ambulatory Visit (INDEPENDENT_AMBULATORY_CARE_PROVIDER_SITE_OTHER): Payer: Medicare PPO

## 2019-03-11 ENCOUNTER — Other Ambulatory Visit: Payer: Self-pay

## 2019-03-11 DIAGNOSIS — Z0000 Encounter for general adult medical examination without abnormal findings: Secondary | ICD-10-CM | POA: Diagnosis not present

## 2019-03-11 NOTE — Patient Instructions (Signed)
Gina Middleton , Thank you for taking time to come for your Medicare Wellness Visit. I appreciate your ongoing commitment to your health goals. Please review the following plan we discussed and let me know if I can assist you in the future.   Screening recommendations/referrals: Colonoscopy: No longer required.  Mammogram: No longer required.  Bone Density: Up to date, due 01/2020 Recommended yearly ophthalmology/optometry visit for glaucoma screening and checkup Recommended yearly dental visit for hygiene and checkup  Vaccinations: Influenza vaccine: Pt declines today.  Pneumococcal vaccine: Completed series Tdap vaccine: Up to date, due 12/2023 Shingles vaccine: Pt declines today.     Advanced directives: Please bring a copy of your POA (Power of Attorney) and/or Living Will to your next appointment.   Conditions/risks identified: Recommend increasing water intake to 6-8 8 oz glasses a day.   Next appointment: 03/15/19 @ 2:00 PM with Lillia Abed. Declined scheduling an AWV for 2022 at this time.    Preventive Care 20 Years and Older, Female Preventive care refers to lifestyle choices and visits with your health care provider that can promote health and wellness. What does preventive care include?  A yearly physical exam. This is also called an annual well check.  Dental exams once or twice a year.  Routine eye exams. Ask your health care provider how often you should have your eyes checked.  Personal lifestyle choices, including:  Daily care of your teeth and gums.  Regular physical activity.  Eating a healthy diet.  Avoiding tobacco and drug use.  Limiting alcohol use.  Practicing safe sex.  Taking low-dose aspirin every day.  Taking vitamin and mineral supplements as recommended by your health care provider. What happens during an annual well check? The services and screenings done by your health care provider during your annual well check will depend on your  age, overall health, lifestyle risk factors, and family history of disease. Counseling  Your health care provider may ask you questions about your:  Alcohol use.  Tobacco use.  Drug use.  Emotional well-being.  Home and relationship well-being.  Sexual activity.  Eating habits.  History of falls.  Memory and ability to understand (cognition).  Work and work Statistician.  Reproductive health. Screening  You may have the following tests or measurements:  Height, weight, and BMI.  Blood pressure.  Lipid and cholesterol levels. These may be checked every 5 years, or more frequently if you are over 20 years old.  Skin check.  Lung cancer screening. You may have this screening every year starting at age 9 if you have a 30-pack-year history of smoking and currently smoke or have quit within the past 15 years.  Fecal occult blood test (FOBT) of the stool. You may have this test every year starting at age 35.  Flexible sigmoidoscopy or colonoscopy. You may have a sigmoidoscopy every 5 years or a colonoscopy every 10 years starting at age 23.  Hepatitis C blood test.  Hepatitis B blood test.  Sexually transmitted disease (STD) testing.  Diabetes screening. This is done by checking your blood sugar (glucose) after you have not eaten for a while (fasting). You may have this done every 1-3 years.  Bone density scan. This is done to screen for osteoporosis. You may have this done starting at age 68.  Mammogram. This may be done every 1-2 years. Talk to your health care provider about how often you should have regular mammograms. Talk with your health care provider about your test results,  treatment options, and if necessary, the need for more tests. Vaccines  Your health care provider may recommend certain vaccines, such as:  Influenza vaccine. This is recommended every year.  Tetanus, diphtheria, and acellular pertussis (Tdap, Td) vaccine. You may need a Td booster  every 10 years.  Zoster vaccine. You may need this after age 38.  Pneumococcal 13-valent conjugate (PCV13) vaccine. One dose is recommended after age 11.  Pneumococcal polysaccharide (PPSV23) vaccine. One dose is recommended after age 61. Talk to your health care provider about which screenings and vaccines you need and how often you need them. This information is not intended to replace advice given to you by your health care provider. Make sure you discuss any questions you have with your health care provider. Document Released: 03/03/2015 Document Revised: 10/25/2015 Document Reviewed: 12/06/2014 Elsevier Interactive Patient Education  2017 [place] Prevention in the Home Falls can cause injuries. They can happen to people of all ages. There are many things you can do to make your home safe and to help prevent falls. What can I do on the outside of my home?  Regularly fix the edges of walkways and driveways and fix any cracks.  Remove anything that might make you trip as you walk through a door, such as a raised step or threshold.  Trim any bushes or trees on the path to your home.  Use bright outdoor lighting.  Clear any walking paths of anything that might make someone trip, such as rocks or tools.  Regularly check to see if handrails are loose or broken. Make sure that both sides of any steps have handrails.  Any raised decks and porches should have guardrails on the edges.  Have any leaves, snow, or ice cleared regularly.  Use sand or salt on walking paths during winter.  Clean up any spills in your garage right away. This includes oil or grease spills. What can I do in the bathroom?  Use night lights.  Install grab bars by the toilet and in the tub and shower. Do not use towel bars as grab bars.  Use non-skid mats or decals in the tub or shower.  If you need to sit down in the shower, use a plastic, non-slip stool.  Keep the floor dry. Clean up any  water that spills on the floor as soon as it happens.  Remove soap buildup in the tub or shower regularly.  Attach bath mats securely with double-sided non-slip rug tape.  Do not have throw rugs and other things on the floor that can make you trip. What can I do in the bedroom?  Use night lights.  Make sure that you have a light by your bed that is easy to reach.  Do not use any sheets or blankets that are too big for your bed. They should not hang down onto the floor.  Have a firm chair that has side arms. You can use this for support while you get dressed.  Do not have throw rugs and other things on the floor that can make you trip. What can I do in the kitchen?  Clean up any spills right away.  Avoid walking on wet floors.  Keep items that you use a lot in easy-to-reach places.  If you need to reach something above you, use a strong step stool that has a grab bar.  Keep electrical cords out of the way.  Do not use floor polish or wax that makes floors  slippery. If you must use wax, use non-skid floor wax.  Do not have throw rugs and other things on the floor that can make you trip. What can I do with my stairs?  Do not leave any items on the stairs.  Make sure that there are handrails on both sides of the stairs and use them. Fix handrails that are broken or loose. Make sure that handrails are as long as the stairways.  Check any carpeting to make sure that it is firmly attached to the stairs. Fix any carpet that is loose or worn.  Avoid having throw rugs at the top or bottom of the stairs. If you do have throw rugs, attach them to the floor with carpet tape.  Make sure that you have a light switch at the top of the stairs and the bottom of the stairs. If you do not have them, ask someone to add them for you. What else can I do to help prevent falls?  Wear shoes that:  Do not have high heels.  Have rubber bottoms.  Are comfortable and fit you well.  Are closed  at the toe. Do not wear sandals.  If you use a stepladder:  Make sure that it is fully opened. Do not climb a closed stepladder.  Make sure that both sides of the stepladder are locked into place.  Ask someone to hold it for you, if possible.  Clearly mark and make sure that you can see:  Any grab bars or handrails.  First and last steps.  Where the edge of each step is.  Use tools that help you move around (mobility aids) if they are needed. These include:  Canes.  Walkers.  Scooters.  Crutches.  Turn on the lights when you go into a dark area. Replace any light bulbs as soon as they burn out.  Set up your furniture so you have a clear path. Avoid moving your furniture around.  If any of your floors are uneven, fix them.  If there are any pets around you, be aware of where they are.  Review your medicines with your doctor. Some medicines can make you feel dizzy. This can increase your chance of falling. Ask your doctor what other things that you can do to help prevent falls. This information is not intended to replace advice given to you by your health care provider. Make sure you discuss any questions you have with your health care provider. Document Released: 12/01/2008 Document Revised: 07/13/2015 Document Reviewed: 03/11/2014 Elsevier Interactive Patient Education  2017 [employer].

## 2019-03-12 ENCOUNTER — Telehealth: Payer: Self-pay

## 2019-03-12 NOTE — Telephone Encounter (Signed)
error 

## 2019-03-15 ENCOUNTER — Ambulatory Visit (INDEPENDENT_AMBULATORY_CARE_PROVIDER_SITE_OTHER): Payer: Medicare PPO | Admitting: Physician Assistant

## 2019-03-15 ENCOUNTER — Encounter: Payer: Self-pay | Admitting: Physician Assistant

## 2019-03-15 ENCOUNTER — Other Ambulatory Visit: Payer: Self-pay

## 2019-03-15 VITALS — BP 144/79 | HR 73 | Temp 96.9°F | Resp 16 | Ht 62.0 in | Wt 120.0 lb

## 2019-03-15 DIAGNOSIS — Z0000 Encounter for general adult medical examination without abnormal findings: Secondary | ICD-10-CM

## 2019-03-15 DIAGNOSIS — R739 Hyperglycemia, unspecified: Secondary | ICD-10-CM | POA: Diagnosis not present

## 2019-03-15 DIAGNOSIS — Z1239 Encounter for other screening for malignant neoplasm of breast: Secondary | ICD-10-CM | POA: Diagnosis not present

## 2019-03-15 DIAGNOSIS — I10 Essential (primary) hypertension: Secondary | ICD-10-CM

## 2019-03-15 NOTE — Progress Notes (Signed)
Patient: Gina Middleton, Female    DOB: [birth year], 84 y.o.   MRN: [id number] Visit Date: 03/15/2019  Today's Provider: [name], PA-C   Chief Complaint  Patient presents with  . Annual Exam   Subjective:     Complete Physical Gina Middleton is a 84 y.o. female. She feels well. She reports exercising none. She reports she is sleeping well. -----------------------------------------------------------   Review of Systems  Constitutional: Negative.   HENT: Negative.   Eyes: Positive for visual disturbance.  Respiratory: Negative.   Cardiovascular: Negative.   Gastrointestinal: Negative.   Endocrine: Negative.   Genitourinary: Negative.   Musculoskeletal: Positive for arthralgias.  Skin: Negative.   Allergic/Immunologic: Negative.   Neurological: Positive for numbness ("right hand').  Hematological: Negative.   Psychiatric/Behavioral: Negative.     Social History   Socioeconomic History  . Marital status: Married    Spouse name: Kyung Rudd  . Number of children: 0  . Years of education: College  . Highest education level: Master's degree (e.g., MA, MS, MEng, MEd, MSW, MBA)  Occupational History  . Occupation: Retired Programmer, multimedia  . Occupation: Works part-time at [employer] and [employer]  . Occupation: [employer]    Employer: [employer]    Comment: Part Time  Tobacco Use  . Smoking status: Never Smoker  . Smokeless tobacco: Never Used  Substance and Sexual Activity  . Alcohol use: Not Currently  . Drug use: No  . Sexual activity: Never  Other Topics Concern  . Not on file  Social History Narrative  . Not on file   Social Determinants of Health   Financial Resource Strain: Low Risk   . Difficulty of Paying Living Expenses: Not hard at all  Food Insecurity: No Food Insecurity  . Worried About Charity fundraiser in the Last Year: Never true  . Ran Out of Food in the Last Year: Never true  Transportation Needs: No  Transportation Needs  . Lack of Transportation (Medical): No  . Lack of Transportation (Non-Medical): No  Physical Activity: Inactive  . Days of Exercise per Week: 0 days  . Minutes of Exercise per Session: 0 min  Stress: No Stress Concern Present  . Feeling of Stress : Only a little  Social Connections: Somewhat Isolated  . Frequency of Communication with Friends and Family: Once a week  . Frequency of Social Gatherings with Friends and Family: Once a week  . Attends Religious Services: More than 4 times per year  . Active Member of Clubs or Organizations: No  . Attends Archivist Meetings: Never  . Marital Status: Married  Human resources officer Violence: Not At Risk  . Fear of Current or Ex-Partner: No  . Emotionally Abused: No  . Physically Abused: No  . Sexually Abused: No    Past Medical History:  Diagnosis Date  . Hypertension   . Kidney stone   . Renal disorder      Patient Active Problem List   Diagnosis Date Noted  . Arthritis of hip 04/27/2018  . Renal stone 04/09/2018  . Kidney stone 04/09/2018  . Achilles tendonitis 05/12/2015  . Allergic rhinitis 09/20/2014  . Benign paroxysmal positional nystagmus 09/20/2014  . Blood glucose elevated 09/20/2014  . Neuralgia neuritis, sciatic nerve 09/20/2014  . Vegetarian 09/20/2014  . B12 deficiency 09/20/2014  . Avitaminosis D 09/20/2014  . Other specified health status 09/20/2014  . Hypertension 09/07/2014    Past Surgical History:  Procedure Laterality Date  . APPENDECTOMY    . [place]  . CHOLECYSTECTOMY    . CYSTOSCOPY W/ RETROGRADES Left 04/09/2018   Procedure: CYSTOSCOPY WITH RETROGRADE PYELOGRAM;  Surgeon: Abbie Sons, MD;  Location: [facility] ORS;  Service: Urology;  Laterality: Left;  . CYSTOSCOPY WITH STENT PLACEMENT Left 04/09/2018   Procedure: CYSTOSCOPY WITH STENT PLACEMENT-LEFT;  Surgeon: Abbie Sons, MD;  Location: [facility] OR[facility];  Service: Urology;  Laterality: Left;  . CYSTOSCOPY WITH  URETEROSCOPY Left 04/09/2018   Procedure: POSSIBLE URETEROSCOPY-LEFT;  Surgeon: Abbie Sons, MD;  Location: [facility] ORS;  Service: Urology;  Laterality: Left;    Her family history includes Alzheimer's disease in her mother; Breast cancer in her maternal grandmother; Diabetes in her sister; Heart attack in her father and sister; Hypertension in her mother.   Current Outpatient Medications:  .  amLODipine (NORVASC) 5 MG tablet, TAKE 1 TABLET(5 MG) BY MOUTH DAILY, Disp: 90 tablet, Rfl: 1 .  aspirin 81 MG tablet, Take 81 mg by mouth daily. , Disp: , Rfl:  .  Biotin 1000 MCG tablet, Take 1,000 mcg by mouth daily. occasionally, Disp: , Rfl:  .  Cholecalciferol (VITAMIN D3) 25 MCG (1000 UT) CAPS, Take 1,000 Units by mouth daily. , Disp: , Rfl:  .  CRANBERRY PO, Take 1 tablet by mouth daily. Skips occasionally, Disp: , Rfl:  .  Cyanocobalamin 1000 MCG TBCR, Take 1,000 mcg by mouth daily. occasionally, Disp: , Rfl:  .  meloxicam (MOBIC) 7.5 MG tablet, Take 7.5 mg by mouth daily. , Disp: , Rfl:  .  Omega-3 Fatty Acids (FISH OIL) 1000 MG CPDR, Take 1,000 mg by mouth daily. , Disp: , Rfl:   Current Facility-Administered Medications:  .  lidocaine (XYLOCAINE) 2 % jelly 1 application, 1 application, Urethral, Once, Stoioff, Ronda Fairly, MD  Patient Care Team: [name], PA-C as PCP - General (Family Medicine) Ralene Bathe, MD (Dermatology) Thornton Park, MD as Referring Physician (Orthopedic Surgery) Abbie Sons, MD (Urology) Eulogio Bear, MD as Consulting Physician (Ophthalmology)     Objective:    Vitals: BP (!) 144/79 (BP Location: Left Arm, Patient Position: Sitting, Cuff Size: Large)   Pulse 73   Temp (!) 96.9 F (36.1 C) (Temporal)   Resp 16   Ht 5\' 2"  (1.575 m)   Wt 120 lb (54.4 kg)   BMI 21.95 kg/m   Physical Exam Vitals reviewed.  Constitutional:      General: She is not in acute distress.    Appearance: Normal appearance. She is well-developed and  normal weight. She is not ill-appearing or diaphoretic.  HENT:     Head: Normocephalic and atraumatic.     Right Ear: Tympanic membrane, ear canal and external ear normal.     Left Ear: Tympanic membrane, ear canal and external ear normal.  Eyes:     General: No scleral icterus.       Right eye: No discharge.        Left eye: No discharge.     Extraocular Movements: Extraocular movements intact.     Conjunctiva/sclera: Conjunctivae normal.     Pupils: Pupils are equal, round, and reactive to light.  Neck:     Thyroid: No thyromegaly.     Vascular: No carotid bruit or JVD.     Trachea: No tracheal deviation.  Cardiovascular:     Rate and Rhythm: Normal rate and regular rhythm.     Pulses: Normal pulses.  Heart sounds: Normal heart sounds. No murmur. No friction rub. No gallop.   Pulmonary:     Effort: Pulmonary effort is normal. No respiratory distress.     Breath sounds: Normal breath sounds. No wheezing or rales.  Chest:     Chest wall: No tenderness.  Abdominal:     General: Bowel sounds are normal. There is no distension.     Palpations: Abdomen is soft. There is no mass.     Tenderness: There is no abdominal tenderness. There is no guarding or rebound.  Musculoskeletal:        General: No tenderness. Normal range of motion.     Cervical back: Normal range of motion and neck supple.     Right lower leg: No edema.     Left lower leg: No edema.  Lymphadenopathy:     Cervical: No cervical adenopathy.  Skin:    General: Skin is warm and dry.     Capillary Refill: Capillary refill takes less than 2 seconds.     Findings: No rash.  Neurological:     General: No focal deficit present.     Mental Status: She is alert and oriented to person, place, and time. Mental status is at baseline.  Psychiatric:        Mood and Affect: Mood normal.        Behavior: Behavior normal.        Thought Content: Thought content normal.        Judgment: Judgment normal.     Activities of  Daily Living In your present state of health, do you have any difficulty performing the following activities: 03/11/2019 04/09/2018  Hearing? N N  Vision? Y N  Comment Due to blurred vision from leakage behind the left eye. -  Difficulty concentrating or making decisions? N N  Walking or climbing stairs? Y N  Comment Due to hip pain. -  Dressing or bathing? N N  Doing errands, shopping? N N  Preparing Food and eating ? N -  Using the Toilet? N -  In the past six months, have you accidently leaked urine? N -  Do you have problems with loss of bowel control? N -  Managing your Medications? N -  Managing your Finances? N -  Housekeeping or managing your Housekeeping? N -  Some recent data might be hidden    Fall Risk Assessment Fall Risk  03/11/2019 02/24/2018 02/12/2017 08/28/2016 02/08/2016  Falls in the past year? 0 0 No No No  Number falls in past yr: 0 0 - - -  Injury with Fall? 0 0 - - -     Depression Screen PHQ 2/9 Scores 03/11/2019 03/11/2019 02/24/2018 02/24/2018  PHQ - 2 Score 0 0 0 0  PHQ- 9 Score - - 0 -    6CIT Screen 02/24/2018  What Year? 0 points  What month? 0 points  What time? 0 points  Count back from 20 0 points  Months in reverse 0 points  Repeat phrase 0 points  Total Score 0       Assessment & Plan:    Annual Physical Reviewed patient's Family Medical History Reviewed and updated list of patient's medical providers Assessment of cognitive impairment was done Assessed patient's functional ability Established a written schedule for health screening [place] Completed and Reviewed  Exercise Activities and Dietary recommendations Goals    . DIET - INCREASE WATER INTAKE     Recommend to drink at least  6-8 8oz glasses of water per day.       Immunization History  Administered Date(s) Administered  . Pneumococcal Conjugate-13 08/28/2016  . Pneumococcal Polysaccharide-23 09/12/2017  . Td 05/10/1995    Health Maintenance    Topic Date Due  . INFLUENZA VACCINE  05/19/2019 (Originally 09/19/2018)  . TETANUS/TDAP  03/10/2020 (Originally 05/09/2005)  . DEXA SCAN  02/08/2020  . PNA vac Low Risk Adult  Completed     Discussed health benefits of physical activity, and encouraged her to engage in regular exercise appropriate for her age and condition.    1. Annual physical exam Normal exam today. Up to date on screenings. Declines vaccinations.   2. Essential hypertension Stable. Diet controlled. Will check labs as below and f/u pending results. - CBC w/Diff/Platelet - Comprehensive Metabolic Panel (CMET) - Lipid Profile - HgB A1c  3. Blood glucose elevated Diet controlled. Will check labs as below and f/u pending results. - CBC w/Diff/Platelet - Comprehensive Metabolic Panel (CMET) - Lipid Profile - HgB A1c  4. Encounter for breast cancer screening using non-mammogram modality Breast exam today was normal. There is no family history of breast cancer. She does perform regular self breast exams. Mammogram was ordered as below. Information for [facility] clinic was given to patient so she may schedule her mammogram at her convenience. - MM 3D SCREEN BREAST BILATERAL; Future  ------------------------------------------------------------------------------------------------------------    [name], PA-C  [facility]

## 2019-03-15 NOTE — Patient Instructions (Signed)
Health Maintenance After Age 82 After age 82, you are at a higher risk for certain long-term diseases and infections as well as injuries from falls. Falls are a major cause of broken bones and head injuries in people who are older than age 82. Getting regular preventive care can help to keep you healthy and well. Preventive care includes getting regular testing and making lifestyle changes as recommended by your health care provider. Talk with your health care provider about:  Which screenings and tests you should have. A screening is a test that checks for a disease when you have no symptoms.  A diet and exercise plan that is right for you. What should I know about screenings and tests to prevent falls? Screening and testing are the best ways to find a health problem early. Early diagnosis and treatment give you the best chance of managing medical conditions that are common after age 82. Certain conditions and lifestyle choices may make you more likely to have a fall. Your health care provider may recommend:  Regular vision checks. Poor vision and conditions such as cataracts can make you more likely to have a fall. If you wear glasses, make sure to get your prescription updated if your vision changes.  Medicine review. Work with your health care provider to regularly review all of the medicines you are taking, including over-the-counter medicines. Ask your health care provider about any side effects that may make you more likely to have a fall. Tell your health care provider if any medicines that you take make you feel dizzy or sleepy.  Osteoporosis screening. Osteoporosis is a condition that causes the bones to get weaker. This can make the bones weak and cause them to break more easily.  Blood pressure screening. Blood pressure changes and medicines to control blood pressure can make you feel dizzy.  Strength and balance checks. Your health care provider may recommend certain tests to check your  strength and balance while standing, walking, or changing positions.  Foot health exam. Foot pain and numbness, as well as not wearing proper footwear, can make you more likely to have a fall.  Depression screening. You may be more likely to have a fall if you have a fear of falling, feel emotionally low, or feel unable to do activities that you used to do.  Alcohol use screening. Using too much alcohol can affect your balance and may make you more likely to have a fall. What actions can I take to lower my risk of falls? General instructions  Talk with your health care provider about your risks for falling. Tell your health care provider if: ? You fall. Be sure to tell your health care provider about all falls, even ones that seem minor. ? You feel dizzy, sleepy, or off-balance.  Take over-the-counter and prescription medicines only as told by your health care provider. These include any supplements.  Eat a healthy diet and maintain a healthy weight. A healthy diet includes low-fat dairy products, low-fat (lean) meats, and fiber from whole grains, beans, and lots of fruits and vegetables. Home safety  Remove any tripping hazards, such as rugs, cords, and clutter.  Install safety equipment such as grab bars in bathrooms and safety rails on stairs.  Keep rooms and walkways well-lit. Activity   Follow a regular exercise program to stay fit. This will help you maintain your balance. Ask your health care provider what types of exercise are appropriate for you.  If you need a cane or   walker, use it as recommended by your health care provider.  Wear supportive shoes that have nonskid soles. Lifestyle  Do not drink alcohol if your health care provider tells you not to drink.  If you drink alcohol, limit how much you have: ? 0-1 drink a day for women. ? 0-2 drinks a day for men.  Be aware of how much alcohol is in your drink. In the [place] one drink equals one typical bottle of beer (12  oz), one-half glass of wine (5 oz), or one shot of hard liquor (1 oz).  Do not use any products that contain nicotine or tobacco, such as cigarettes and e-cigarettes. If you need help quitting, ask your health care provider. Summary  Having a healthy lifestyle and getting preventive care can help to protect your health and wellness after age 82.  Screening and testing are the best way to find a health problem early and help you avoid having a fall. Early diagnosis and treatment give you the best chance for managing medical conditions that are more common for people who are older than age 82.  Falls are a major cause of broken bones and head injuries in people who are older than age 82. Take precautions to prevent a fall at home.  Work with your health care provider to learn what changes you can make to improve your health and wellness and to prevent falls. This information is not intended to replace advice given to you by your health care provider. Make sure you discuss any questions you have with your health care provider. Document Revised: 05/28/2018 Document Reviewed: 12/18/2016 Elsevier Patient Education  2020 Elsevier Inc.  

## 2019-03-16 LAB — LIPID PANEL
Chol/HDL Ratio: 3.4 ratio (ref 0.0–4.4)
Cholesterol, Total: 153 mg/dL (ref 100–199)
HDL: 45 mg/dL (ref >39)
LDL Chol Calc (NIH): 77 mg/dL (ref 0–99)
Triglycerides: 186 mg/dL — ABNORMAL HIGH (ref 0–149)
VLDL Cholesterol Cal: 31 mg/dL (ref 5–40)

## 2019-03-16 LAB — CBC WITH DIFFERENTIAL/PLATELET
Basophils Absolute: 0.1 10*3/uL (ref 0.0–0.2)
Basos: 1 %
EOS (ABSOLUTE): 0.1 10*3/uL (ref 0.0–0.4)
Eos: 2 %
Hematocrit: 36 % (ref 34.0–46.6)
Hemoglobin: 12.3 g/dL (ref 11.1–15.9)
Immature Grans (Abs): 0 10*3/uL (ref 0.0–0.1)
Immature Granulocytes: 0 %
Lymphocytes Absolute: 1.6 10*3/uL (ref 0.7–3.1)
Lymphs: 25 %
MCH: 30.4 pg (ref 26.6–33.0)
MCHC: 34.2 g/dL (ref 31.5–35.7)
MCV: 89 fL (ref 79–97)
Monocytes Absolute: 0.5 10*3/uL (ref 0.1–0.9)
Monocytes: 8 %
Neutrophils Absolute: 4 10*3/uL (ref 1.4–7.0)
Neutrophils: 64 %
Platelets: 239 10*3/uL (ref 150–450)
RBC: 4.05 x10E6/uL (ref 3.77–5.28)
RDW: 12.2 % (ref 11.7–15.4)
WBC: 6.2 10*3/uL (ref 3.4–10.8)

## 2019-03-16 LAB — COMPREHENSIVE METABOLIC PANEL
ALT: 11 IU/L (ref 0–32)
AST: 20 IU/L (ref 0–40)
Albumin/Globulin Ratio: 1.6 (ref 1.2–2.2)
Albumin: 4.4 g/dL (ref 3.6–4.6)
Alkaline Phosphatase: 125 IU/L — ABNORMAL HIGH (ref 39–117)
BUN/Creatinine Ratio: 18 (ref 12–28)
BUN: 15 mg/dL (ref 8–27)
Bilirubin Total: 0.5 mg/dL (ref 0.0–1.2)
CO2: 23 mmol/L (ref 20–29)
Calcium: 9.4 mg/dL (ref 8.7–10.3)
Chloride: 104 mmol/L (ref 96–106)
Creatinine, Ser: 0.84 mg/dL (ref 0.57–1.00)
GFR calc Af Amer: 75 mL/min/{1.73_m2} (ref >59)
GFR calc non Af Amer: 65 mL/min/{1.73_m2} (ref >59)
Globulin, Total: 2.7 g/dL (ref 1.5–4.5)
Glucose: 90 mg/dL (ref 65–99)
Potassium: 4 mmol/L (ref 3.5–5.2)
Sodium: 142 mmol/L (ref 134–144)
Total Protein: 7.1 g/dL (ref 6.0–8.5)

## 2019-03-16 LAB — HEMOGLOBIN A1C
Est. average glucose Bld gHb Est-mCnc: 108 mg/dL
Hgb A1c MFr Bld: 5.4 % (ref 4.8–5.6)

## 2019-03-17 ENCOUNTER — Telehealth: Payer: Self-pay

## 2019-03-17 NOTE — Telephone Encounter (Signed)
-----   Message from [name], [place] sent at 03/17/2019  2:18 PM EST ----- Blood count is normal. Sugar is normal. Kidney function is normal. Liver enzymes are stable. Sodium, potassium and calcium are normal. Cholesterol is normal.

## 2019-03-17 NOTE — Telephone Encounter (Signed)
Tried calling; no answer.  It's okay for the [facility] to give test results below.    Thanks,   -Mickel Baas

## 2019-03-18 NOTE — Telephone Encounter (Signed)
Patient advised as below.  

## 2019-08-14 NOTE — Discharge Instructions (Signed)
Instructions after Total Hip Replacement     Sindi Beckworth P. Lamont Glasscock, Jr., M.D.     Dept. of Orthopaedics & Sports Medicine  [facility]  [address]  [place], [place]  [address]  Phone: [phone]   Fax: [phone]    DIET: . Drink plenty of non-alcoholic fluids. . Resume your normal diet. Include foods high in fiber.  ACTIVITY:  . You may use crutches or a walker with weight-bearing as tolerated, unless instructed otherwise. . You may be weaned off of the walker or crutches by your Physical Therapist.  . Do NOT reach below the level of your knees or cross your legs until allowed.    . Continue doing gentle exercises. Exercising will reduce the pain and swelling, increase motion, and prevent muscle weakness.   . Please continue to use the TED compression stockings for 6 weeks. You may remove the stockings at night, but should reapply them in the morning. . Do not drive or operate any equipment until instructed.  WOUND CARE:  . Continue to use ice packs periodically to reduce pain and swelling. . Keep the incision clean and dry. . You may bathe or shower after the staples are removed at the first office visit following surgery.  MEDICATIONS: . You may resume your regular medications. . Please take the pain medication as prescribed on the medication. . Do not take pain medication on an empty stomach. . You have been given a prescription for a blood thinner to prevent blood clots. Please take the medication as instructed. (NOTE: After completing a 2 week course of Lovenox, take one Enteric-coated aspirin once a day.) . Pain medications and iron supplements can cause constipation. Use a stool softener (Senokot or Colace) on a daily basis and a laxative (dulcolax or miralax) as needed. . Do not drive or drink alcoholic beverages when taking pain medications.  CALL THE OFFICE FOR: . Temperature above 101 degrees . Excessive bleeding or drainage on the dressing. . Excessive  swelling, coldness, or paleness of the toes. . Persistent nausea and vomiting.  FOLLOW-UP:  . You should have an appointment to return to the office in 6 weeks after surgery. . Arrangements have been made for continuation of Physical Therapy (either home therapy or outpatient therapy).     [facility] Department Directory         [email or web address]       [email or web address]          Cardiology  Appointments: [place] - [phone] [place] - [phone]  Endocrinology  Appointments: [place] - [phone] [place] - [phone]  Gastroenterology  Appointments: [place] - [phone] [place] - [phone]        General Surgery   Appointments: [place] - [phone]  Internal Medicine/Family Medicine  Appointments: [place] - [phone] [place] - [phone] [place] - [phone]  Metabolic and Weigh Loss Surgery  Appointments: [place] - [phone]        Neurology  Appointments: [place] - [phone] [place] - [phone]  Neurosurgery  Appointments: [place] - [phone]  Obstetrics & Gynecology  Appointments: [place] - [phone] [place] - [phone]        Pediatrics  Appointments: [place] - [phone] [place] - [phone]  Physiatry  Appointments: [place] -[phone]  Physical Therapy  Appointments: [place] - [phone] [place] - [phone]        Podiatry  Appointments: [place] - [phone] [place] - [phone]  Pulmonology  Appointments: [place] - [phone]  Rheumatology  Appointments: [place] - [phone]        [place] Location: [facility]  [address] [place], [place]  [address]  [place] Location: [facility] [address] [place], [place]  [address]  [place] Location: [facility] [address] [place], [place]  [address]    

## 2019-08-16 ENCOUNTER — Encounter
Admission: RE | Admit: 2019-08-16 | Discharge: 2019-08-16 | Disposition: A | Discharge status: Home / Self Care | Payer: Medicare PPO | Source: Ambulatory Visit | Attending: Orthopedic Surgery | Admitting: Orthopedic Surgery

## 2019-08-16 ENCOUNTER — Other Ambulatory Visit: Payer: Self-pay

## 2019-08-16 DIAGNOSIS — Z01818 Encounter for other preprocedural examination: Secondary | ICD-10-CM | POA: Insufficient documentation

## 2019-08-16 HISTORY — DX: Other specified postprocedural states: Z98.890

## 2019-08-16 HISTORY — DX: Other specified postprocedural states: R11.2

## 2019-08-16 HISTORY — DX: Other complications of anesthesia, initial encounter: T88.59XA

## 2019-08-16 HISTORY — DX: Unspecified osteoarthritis, unspecified site: M19.90

## 2019-08-16 HISTORY — DX: Personal history of urinary calculi: Z87.442

## 2019-08-16 NOTE — Patient Instructions (Signed)
Your procedure is scheduled on: 08-25-19 [facility] Report to Same Day Surgery 2nd floor medical mall [facility] Entrance-take elevator on left to 2nd floor.  Check in with surgery information desk.) To find out your arrival time please call [phone] between 1PM - 3PM on 08-24-19 TUESDAY  Remember: Instructions that are not followed completely may result in serious medical risk, up to and including death, or upon the discretion of your surgeon and anesthesiologist your surgery may need to be rescheduled.    _x___ 1. Do not eat food after midnight the night before your procedure. NO GUM OR CANDY AFTER MIDNIGHT. You may drink clear liquids up to 2 hours before you are scheduled to arrive at the hospital for your procedure.  Do not drink clear liquids within 2 hours of your scheduled arrival to the hospital.  Clear liquids include  --Water or Apple juice without pulp  --Gatorade  --Black Coffee or Clear Tea (No milk, no creamers, do not add anything to the coffee or Tea-ok to add sugar)  _x___Ensure clear carbohydrate drink-FINISH DRINK 2 HOURS PRIOR TO ARRIVAL TIME TO HOSPITAL THE DAY OF YOUR SURGERY    __x__ 2. No Alcohol for 24 hours before or after surgery.   __x__3. No Smoking or e-cigarettes for 24 prior to surgery.  Do not use any chewable tobacco products for at least 6 hour prior to surgery   ____  4. Bring all medications with you on the day of surgery if instructed.    __x__ 5. Notify your doctor if there is any change in your medical condition     (cold, fever, infections).    x___6. On the morning of surgery brush your teeth with toothpaste and water.  You may rinse your mouth with mouth wash if you wish.  Do not swallow any toothpaste or mouthwash.   Do not wear jewelry, make-up, hairpins, clips or nail polish.  Do not wear lotions, powders, or perfumes.  Do not shave 48 hours prior to surgery. Men may shave face and neck.  Do not bring valuables to the hospital.     [facility] is not responsible for any belongings or valuables.               Contacts, dentures or bridgework may not be worn into surgery.  Leave your suitcase in the car. After surgery it may be brought to your room.  For patients admitted to the hospital, discharge time is determined by your treatment team.  _  Patients discharged the day of surgery will not be allowed to drive home.  You will need someone to drive you home and stay with you the night of your procedure.    Please read over the following fact sheets that you were given:   [facility] Preparing for Surgery and MRSA Information/INCENTIVE SPIROMETER INSTRUCTIONS  ____ TAKE THE FOLLOWING MEDICATION THE MORNING OF SURGERY WITH A SMALL SIP OF WATER. These include:  1. NONE  2.  3.  4.  5.  6.  ____Fleets enema or Magnesium Citrate as directed.   _x___ Use CHG Soap or sage wipes as directed on instruction sheet   ____ Use inhalers on the day of surgery and bring to hospital day of surgery  ____ Stop Metformin and Janumet 2 days prior to surgery.    ____ Take 1/2 of usual insulin dose the night before surgery and none on the morning surgery.   _x___ Follow recommendations from Cardiologist, Pulmonologist or PCP regarding  stopping Aspirin, Coumadin, Plavix ,Eliquis, Effient, or Pradaxa, and Pletal-STOP YOUR 81 MG ASPIRIN 7 DAYS PRIOR TO SURGERY  X____Stop Anti-inflammatories such as Advil, Aleve, Ibuprofen, Motrin, Naproxen,MOBIC (MELOXICAM) Naprosyn, Goodies powders or aspirin products 7 DAYS PRIOR TO SURGERY-OK to take Tylenol   ____ Stop supplements until after surgery.    ____ Bring C-Pap to the hospital.

## 2019-08-17 ENCOUNTER — Encounter
Admission: RE | Admit: 2019-08-17 | Discharge: 2019-08-17 | Disposition: A | Discharge status: Home / Self Care | Payer: Medicare PPO | Source: Ambulatory Visit | Attending: Orthopedic Surgery | Admitting: Orthopedic Surgery

## 2019-08-17 DIAGNOSIS — Z01818 Encounter for other preprocedural examination: Secondary | ICD-10-CM | POA: Diagnosis not present

## 2019-08-17 LAB — CBC
HCT: 34.7 % — ABNORMAL LOW (ref 36.0–46.0)
Hemoglobin: 11.7 g/dL — ABNORMAL LOW (ref 12.0–15.0)
MCH: 30.9 pg (ref 26.0–34.0)
MCHC: 33.7 g/dL (ref 30.0–36.0)
MCV: 91.6 fL (ref 80.0–100.0)
Platelets: 207 10*3/uL (ref 150–400)
RBC: 3.79 MIL/uL — ABNORMAL LOW (ref 3.87–5.11)
RDW: 12.8 % (ref 11.5–15.5)
WBC: 5.7 10*3/uL (ref 4.0–10.5)
nRBC: 0 % (ref 0.0–0.2)

## 2019-08-17 LAB — COMPREHENSIVE METABOLIC PANEL
ALT: 13 U/L (ref 0–44)
AST: 22 U/L (ref 15–41)
Albumin: 4.3 g/dL (ref 3.5–5.0)
Alkaline Phosphatase: 100 U/L (ref 38–126)
Anion gap: 10 (ref 5–15)
BUN: 16 mg/dL (ref 8–23)
CO2: 26 mmol/L (ref 22–32)
Calcium: 8.9 mg/dL (ref 8.9–10.3)
Chloride: 103 mmol/L (ref 98–111)
Creatinine, Ser: 0.8 mg/dL (ref 0.44–1.00)
GFR calc Af Amer: >60 mL/min (ref >60)
GFR calc non Af Amer: >60 mL/min (ref >60)
Glucose, Bld: 103 mg/dL — ABNORMAL HIGH (ref 70–99)
Potassium: 3.5 mmol/L (ref 3.5–5.1)
Sodium: 139 mmol/L (ref 135–145)
Total Bilirubin: 1.2 mg/dL (ref 0.3–1.2)
Total Protein: 7.5 g/dL (ref 6.5–8.1)

## 2019-08-17 LAB — URINALYSIS, ROUTINE W REFLEX MICROSCOPIC
Bacteria, UA: NONE SEEN
Bilirubin Urine: NEGATIVE
Glucose, UA: NEGATIVE mg/dL
Hgb urine dipstick: NEGATIVE
Ketones, ur: NEGATIVE mg/dL
Leukocytes,Ua: NEGATIVE
Nitrite: NEGATIVE
Protein, ur: 30 mg/dL — AB
Specific Gravity, Urine: 1.011 (ref 1.005–1.030)
pH: 7 (ref 5.0–8.0)

## 2019-08-17 LAB — TYPE AND SCREEN
ABO/RH(D): O POS
Antibody Screen: NEGATIVE

## 2019-08-17 LAB — PROTIME-INR
INR: 0.9 (ref 0.8–1.2)
Prothrombin Time: 12.1 seconds (ref 11.4–15.2)

## 2019-08-17 LAB — C-REACTIVE PROTEIN: CRP: 0.6 mg/dL (ref <1.0)

## 2019-08-17 LAB — SEDIMENTATION RATE: Sed Rate: 35 mm/hr — ABNORMAL HIGH (ref 0–30)

## 2019-08-17 LAB — SURGICAL PCR SCREEN
MRSA, PCR: NEGATIVE
Staphylococcus aureus: POSITIVE — AB

## 2019-08-17 LAB — APTT: aPTT: 26 seconds (ref 24–36)

## 2019-08-17 NOTE — Progress Notes (Signed)
  [facility] Perioperative Services: Pre-Admission/Anesthesia Testing  Abnormal Lab Notification  Date: 08/17/19  Name: Gina Middleton MRN:   [id number]  Re: Abnormal labs noted during PAT appointment  Provider Notified: Dereck Leep, MD Notification mode: Routed via [place] of concern: Lab Results  Component Value Date   STAPHAUREUS POSITIVE (A) 08/17/2019   MRSAPCR NEGATIVE 08/17/2019    Honor Loh, MSN, APRN, FNP-C, CEN [place]  Peri-operative Services Nurse Practitioner Phone: [phone] 08/17/19 12:22 PM

## 2019-08-18 ENCOUNTER — Telehealth: Payer: Self-pay

## 2019-08-18 LAB — URINE CULTURE: Special Requests: NORMAL

## 2019-08-18 NOTE — Telephone Encounter (Signed)
Copied from [place] [id number]. Topic: General - Inquiry >> Aug 18, 2019 12:13 PM Alease Frame wrote: Reason for CRM: Pt received a phone call from office however was disconnected. Please return pts call. Please advise   [phone]

## 2019-08-24 ENCOUNTER — Other Ambulatory Visit: Payer: Self-pay

## 2019-08-24 ENCOUNTER — Other Ambulatory Visit
Admission: RE | Admit: 2019-08-24 | Discharge: 2019-08-24 | Disposition: A | Discharge status: Home / Self Care | Payer: Medicare PPO | Source: Ambulatory Visit | Attending: Orthopedic Surgery | Admitting: Orthopedic Surgery

## 2019-08-24 LAB — SARS CORONAVIRUS 2 (TAT 6-24 HRS): SARS Coronavirus 2: NEGATIVE

## 2019-08-24 NOTE — H&P (Signed)
ORTHOPAEDIC HISTORY & PHYSICAL  Progress Notes Gwenlyn Fudge, [place] - 08/18/2019 3:15 PM EDT [facility] Chief Complaint:   Chief Complaint  Patient presents with  . Hip Pain  H & P RIGHT HIP   History of Present Illness:   Gina Middleton is a 84 y.o. female that presents to clinic today for her preoperative history and evaluation. Patient presents unaccompanied. The patient is scheduled to undergo a right total hip arthroplasty on 08/25/19 by Dr. Marry Guan. Her pain began around 7 years ago. The pain is located in the right hip and groin. She describes her pain as worse with weightbearing. She denies associated numbness or tingling.   The patient's symptoms have progressed to the point that they decrease her quality of life. The patient has previously undergone conservative treatment including NSAIDS and activity modification without adequate control of her symptoms.  Patient denies history of blood clots, significant cardiac history. She did have lumbar surgery but denies any hardware. No significant allergies related to surgical procedure.  Past Medical, Surgical, Family, Social History, Allergies, Medications:   Past Medical History:  Past Medical History:  Diagnosis Date  . Hypertension  . Osteoarthritis   Past Surgical History:  Past Surgical History:  Procedure Laterality Date  . APPENDECTOMY  . BACK SURGERY  . CHOLECYSTECTOMY   Current Medications:  Current Outpatient Medications  Medication Sig Dispense Refill  . amLODIPine (NORVASC) 5 MG tablet TAKE 1 TABLET(5 MG) BY MOUTH DAILY  . aspirin 81 MG EC tablet Take 81 mg by mouth once daily  . meloxicam (MOBIC) 7.5 MG tablet Take 1 tablet (7.5 mg total) by mouth 2 (two) times daily 60 tablet 1   No current facility-administered medications for this visit.   Allergies:  Allergies  Allergen Reactions  . Iodinated Contrast Media Anaphylaxis  . Hydrocodone-Acetaminophen  Dizziness  . Oxycodone-Acetaminophen Vomiting   Social History:  Social History   Socioeconomic History  . Marital status: Married  Spouse name: Not on file  . Number of children: Not on file  . Years of education: Not on file  . Highest education level: Not on file  Occupational History  . Not on file  Tobacco Use  . Smoking status: Never Smoker  . Smokeless tobacco: Never Used  Substance and Sexual Activity  . Alcohol use: No  . Drug use: No  . Sexual activity: Defer  Other Topics Concern  . Not on file  Social History Narrative  . Not on file   Social Determinants of Health   Financial Resource Strain:  . Difficulty of Paying Living Expenses:  Food Insecurity:  . Worried About Charity fundraiser in the Last Year:  . Arboriculturist in the Last Year:  Transportation Needs:  . Film/video editor (Medical):  Marland Kitchen Lack of Transportation (Non-Medical):  Physical Activity:  . Days of Exercise per Week:  . Minutes of Exercise per Session:  Stress:  . Feeling of Stress :  Social Connections:  . Frequency of Communication with Friends and Family:  . Frequency of Social Gatherings with Friends and Family:  . Attends Religious Services:  . Active Member of Clubs or Organizations:  . Attends Archivist Meetings:  Marland Kitchen Marital Status:   Family History:  Family History  Problem Relation Age of Onset  . Osteoarthritis Mother  . Heart disease Father  . Diabetes type II Sister   Review of Systems:   A 10+ ROS  was performed, reviewed, and the pertinent orthopaedic findings are documented in the HPI.   Physical Examination:   BP 150/80  Ht 160 cm (5\' 3" )  Wt 53 kg (116 lb 12.8 oz)  BMI 20.69 kg/m   Patient is a well-developed, well-nourished female in no acute distress. Patient has normal mood and affect. Patient is alert and oriented to person, place, and time.   HEENT: Atraumatic, normocephalic. Pupils equal and reactive to light. Extraocular motion  intact. Noninjected sclera.  Cardiovascular: Regular rate and rhythm, with no murmurs, rubs, or gallops. Distal pulses palpable.  Respiratory: Lungs clear to auscultation bilaterally.   Right Hip: Pelvic tilt: Negative Limb lengths: Equal with the patient standing Soft tissue swelling: Negative Erythema: Negative Crepitance: Negative Tenderness: Greater trochanter is nontender to palpation. Moderate pain is elicited by axial compression or extremes of rotation. Atrophy: No atrophy. Fair to good hip flexor and abductor strength. Range of Motion: EXT/FLEX: 0/0/10 ADD/ABD: 20/0/20 IR/ER: 0/0/  Sensation is intact over the saphenous, lateral cutaneous, superficial fibular, and deep fibular nerve distributions.  Tests Performed/Reviewed:  X-rays  No new radiographs were obtained today. Previous radiographs were reviewed of the right hip and revealed complete loss of femoral acetabular joint space with bone-on-bone contact and significant osteophyte formation of the acetabulum and femoral head. Subchondral sclerosis noted. No fractures or dislocations.  Impression:   ICD-10-CM  1. Primary osteoarthritis of right hip M16.11   Plan:   The patient has end-stage degenerative changes of the right hip. It was explained to the patient that the condition is progressive in nature. Having failed conservative treatment, the patient has elected to proceed with a total joint arthroplasty. The patient will undergo a total joint arthroplasty with Dr. Marry Guan. The risks of surgery, including blood clot and infection, were discussed with the patient. Measures to reduce these risks, including the use of anticoagulation, perioperative antibiotics, and early ambulation were discussed. The importance of postoperative physical therapy was discussed with the patient. The patient elects to proceed with surgery. The patient is instructed to stop all blood thinners prior to surgery. The patient is instructed to call  the hospital the day before surgery to learn of the proper arrival time.   Contact our office with any questions or concerns. Follow up as indicated, or sooner should any new problems arise, if conditions worsen, or if they are otherwise concerned.   Gwenlyn Fudge, PA-C [facility] [place], [place] [address] Phone: [phone]  This note was generated in part with voice recognition software and I apologize for any typographical errors that were not detected and corrected.   Electronically signed by Gwenlyn Fudge, PA at 08/18/2019 7:50 PM EDT

## 2019-08-25 ENCOUNTER — Inpatient Hospital Stay: Payer: Medicare PPO

## 2019-08-25 ENCOUNTER — Inpatient Hospital Stay: Payer: Medicare PPO | Admitting: Anesthesiology

## 2019-08-25 ENCOUNTER — Encounter
Admission: RE | Disposition: A | Discharge status: Home / Self Care | Payer: Self-pay | Source: Home / Self Care | Attending: Orthopedic Surgery

## 2019-08-25 ENCOUNTER — Encounter: Payer: Self-pay | Admitting: Orthopedic Surgery

## 2019-08-25 ENCOUNTER — Inpatient Hospital Stay
Admission: RE | Admit: 2019-08-25 | Discharge: 2019-08-28 | DRG: 470 | Disposition: A | Discharge status: Home / Self Care | Payer: Medicare PPO | Attending: Orthopedic Surgery | Admitting: Orthopedic Surgery

## 2019-08-25 DIAGNOSIS — M1611 Unilateral primary osteoarthritis, right hip: Secondary | ICD-10-CM | POA: Diagnosis present

## 2019-08-25 DIAGNOSIS — Z8249 Family history of ischemic heart disease and other diseases of the circulatory system: Secondary | ICD-10-CM

## 2019-08-25 DIAGNOSIS — R112 Nausea with vomiting, unspecified: Secondary | ICD-10-CM | POA: Diagnosis not present

## 2019-08-25 DIAGNOSIS — Z833 Family history of diabetes mellitus: Secondary | ICD-10-CM

## 2019-08-25 DIAGNOSIS — Z96641 Presence of right artificial hip joint: Secondary | ICD-10-CM

## 2019-08-25 DIAGNOSIS — I10 Essential (primary) hypertension: Secondary | ICD-10-CM | POA: Diagnosis present

## 2019-08-25 DIAGNOSIS — Z96649 Presence of unspecified artificial hip joint: Secondary | ICD-10-CM

## 2019-08-25 DIAGNOSIS — Z20822 Contact with and (suspected) exposure to covid-19: Secondary | ICD-10-CM | POA: Diagnosis present

## 2019-08-25 HISTORY — PX: TOTAL HIP ARTHROPLASTY: SHX124

## 2019-08-25 LAB — ABO/RH: ABO/RH(D): O POS

## 2019-08-25 SURGERY — ARTHROPLASTY, HIP, TOTAL,POSTERIOR APPROACH
Anesthesia: Spinal | Site: Hip | Laterality: Right

## 2019-08-25 MED ORDER — DEXAMETHASONE SODIUM PHOSPHATE 10 MG/ML IJ SOLN
INTRAMUSCULAR | Status: AC
  Administered 2019-08-25: 8 mg via INTRAVENOUS
  Filled 2019-08-25: qty 1

## 2019-08-25 MED ORDER — METOCLOPRAMIDE HCL 5 MG/ML IJ SOLN
INTRAMUSCULAR | Status: AC
  Administered 2019-08-25: 10 mg via INTRAVENOUS
  Filled 2019-08-25: qty 2

## 2019-08-25 MED ORDER — ENOXAPARIN SODIUM 30 MG/0.3ML ~~LOC~~ SOLN
30.0000 mg | Freq: Two times a day (BID) | SUBCUTANEOUS | Status: DC
  Administered 2019-08-26 – 2019-08-28 (×5): 30 mg via SUBCUTANEOUS
  Filled 2019-08-25 (×5): qty 0.3

## 2019-08-25 MED ORDER — CELECOXIB 200 MG PO CAPS
ORAL_CAPSULE | ORAL | Status: AC
  Administered 2019-08-25: 400 mg via ORAL
  Filled 2019-08-25: qty 2

## 2019-08-25 MED ORDER — ACETAMINOPHEN 10 MG/ML IV SOLN
INTRAVENOUS | Status: AC
  Administered 2019-08-25: 1000 mg via INTRAVENOUS
  Filled 2019-08-25: qty 100

## 2019-08-25 MED ORDER — PHENOL 1.4 % MT LIQD
1.0000 | OROMUCOSAL | Status: DC | PRN
  Filled 2019-08-25: qty 177

## 2019-08-25 MED ORDER — FENTANYL CITRATE (PF) 100 MCG/2ML IJ SOLN
INTRAMUSCULAR | Status: DC | PRN
  Administered 2019-08-25: 50 ug via INTRAVENOUS
  Administered 2019-08-25: 100 ug via INTRAVENOUS
  Administered 2019-08-25 (×2): 50 ug via INTRAVENOUS

## 2019-08-25 MED ORDER — METOCLOPRAMIDE HCL 5 MG/ML IJ SOLN: 5.0000 mg | Freq: Three times a day (TID) | INTRAMUSCULAR | Status: DC | PRN

## 2019-08-25 MED ORDER — DEXAMETHASONE SODIUM PHOSPHATE 10 MG/ML IJ SOLN: 8.0000 mg | Freq: Once | INTRAMUSCULAR | Status: AC

## 2019-08-25 MED ORDER — ONDANSETRON HCL 4 MG/2ML IJ SOLN
INTRAMUSCULAR | Status: AC
  Filled 2019-08-25: qty 2

## 2019-08-25 MED ORDER — DIPHENHYDRAMINE HCL 12.5 MG/5ML PO ELIX
12.5000 mg | ORAL_SOLUTION | ORAL | Status: DC | PRN
  Filled 2019-08-25: qty 10

## 2019-08-25 MED ORDER — SENNOSIDES-DOCUSATE SODIUM 8.6-50 MG PO TABS
1.0000 | ORAL_TABLET | Freq: Two times a day (BID) | ORAL | Status: DC
  Administered 2019-08-25 – 2019-08-28 (×5): 1 via ORAL
  Filled 2019-08-25 (×7): qty 1

## 2019-08-25 MED ORDER — CELECOXIB 200 MG PO CAPS
200.0000 mg | ORAL_CAPSULE | Freq: Two times a day (BID) | ORAL | Status: DC
  Administered 2019-08-25 – 2019-08-28 (×6): 200 mg via ORAL
  Filled 2019-08-25 (×6): qty 1

## 2019-08-25 MED ORDER — EPHEDRINE 5 MG/ML INJ
INTRAVENOUS | Status: AC
  Filled 2019-08-25: qty 10

## 2019-08-25 MED ORDER — ONDANSETRON HCL 4 MG/2ML IJ SOLN
4.0000 mg | Freq: Four times a day (QID) | INTRAMUSCULAR | Status: DC | PRN
  Administered 2019-08-25 – 2019-08-27 (×3): 4 mg via INTRAVENOUS
  Filled 2019-08-25: qty 2

## 2019-08-25 MED ORDER — TRANEXAMIC ACID-NACL 1000-0.7 MG/100ML-% IV SOLN
INTRAVENOUS | Status: AC
  Filled 2019-08-25: qty 100

## 2019-08-25 MED ORDER — FLEET ENEMA 7-19 GM/118ML RE ENEM: 1.0000 | ENEMA | Freq: Once | RECTAL | Status: DC | PRN

## 2019-08-25 MED ORDER — LIDOCAINE HCL (PF) 2 % IJ SOLN
INTRAMUSCULAR | Status: AC
  Filled 2019-08-25: qty 5

## 2019-08-25 MED ORDER — FAMOTIDINE 20 MG PO TABS
ORAL_TABLET | ORAL | Status: AC
  Administered 2019-08-25: 20 mg via ORAL
  Filled 2019-08-25: qty 1

## 2019-08-25 MED ORDER — TRANEXAMIC ACID-NACL 1000-0.7 MG/100ML-% IV SOLN
INTRAVENOUS | Status: AC
  Administered 2019-08-25: 1000 mg via INTRAVENOUS
  Filled 2019-08-25: qty 100

## 2019-08-25 MED ORDER — CHLORHEXIDINE GLUCONATE 0.12 % MT SOLN: 15.0000 mL | Freq: Once | OROMUCOSAL | Status: AC

## 2019-08-25 MED ORDER — CEFAZOLIN SODIUM-DEXTROSE 2-4 GM/100ML-% IV SOLN
2.0000 g | INTRAVENOUS | Status: AC
  Administered 2019-08-25: 2 g via INTRAVENOUS

## 2019-08-25 MED ORDER — OXYCODONE HCL 5 MG PO TABS
5.0000 mg | ORAL_TABLET | ORAL | Status: DC | PRN
  Filled 2019-08-25: qty 1

## 2019-08-25 MED ORDER — CHLORHEXIDINE GLUCONATE 4 % EX LIQD: 60.0000 mL | Freq: Once | CUTANEOUS | Status: DC

## 2019-08-25 MED ORDER — ONDANSETRON HCL 4 MG/2ML IJ SOLN
INTRAMUSCULAR | Status: AC
  Administered 2019-08-25: 4 mg via INTRAVENOUS
  Filled 2019-08-25: qty 2

## 2019-08-25 MED ORDER — PANTOPRAZOLE SODIUM 40 MG PO TBEC
40.0000 mg | DELAYED_RELEASE_TABLET | Freq: Two times a day (BID) | ORAL | Status: DC
  Administered 2019-08-25 – 2019-08-28 (×6): 40 mg via ORAL
  Filled 2019-08-25 (×7): qty 1

## 2019-08-25 MED ORDER — ORAL CARE MOUTH RINSE: 15.0000 mL | Freq: Once | OROMUCOSAL | Status: AC

## 2019-08-25 MED ORDER — ENSURE PRE-SURGERY PO LIQD
296.0000 mL | Freq: Once | ORAL | Status: DC
  Filled 2019-08-25: qty 296

## 2019-08-25 MED ORDER — MAGNESIUM HYDROXIDE 400 MG/5ML PO SUSP
30.0000 mL | Freq: Every day | ORAL | Status: DC
  Administered 2019-08-26 – 2019-08-27 (×2): 30 mL via ORAL
  Filled 2019-08-25 (×3): qty 30

## 2019-08-25 MED ORDER — FENTANYL CITRATE (PF) 100 MCG/2ML IJ SOLN
INTRAMUSCULAR | Status: AC
  Administered 2019-08-25: 25 ug via INTRAVENOUS
  Filled 2019-08-25: qty 2

## 2019-08-25 MED ORDER — OXYCODONE HCL 5 MG PO TABS
ORAL_TABLET | ORAL | Status: AC
  Administered 2019-08-25: 5 mg via ORAL
  Filled 2019-08-25: qty 1

## 2019-08-25 MED ORDER — SODIUM CHLORIDE 0.9 % IV SOLN: INTRAVENOUS | Status: DC

## 2019-08-25 MED ORDER — METOCLOPRAMIDE HCL 10 MG PO TABS: 5.0000 mg | ORAL_TABLET | Freq: Three times a day (TID) | ORAL | Status: DC | PRN

## 2019-08-25 MED ORDER — TRANEXAMIC ACID-NACL 1000-0.7 MG/100ML-% IV SOLN
1000.0000 mg | INTRAVENOUS | Status: AC
  Administered 2019-08-25: 1000 mg via INTRAVENOUS

## 2019-08-25 MED ORDER — CHLORHEXIDINE GLUCONATE 0.12 % MT SOLN
OROMUCOSAL | Status: AC
  Administered 2019-08-25: 15 mL via OROMUCOSAL
  Filled 2019-08-25: qty 15

## 2019-08-25 MED ORDER — OXYCODONE HCL 5 MG PO TABS: 10.0000 mg | ORAL_TABLET | ORAL | Status: DC | PRN

## 2019-08-25 MED ORDER — FAMOTIDINE 20 MG PO TABS: 20.0000 mg | ORAL_TABLET | Freq: Once | ORAL | Status: AC

## 2019-08-25 MED ORDER — BUPIVACAINE HCL (PF) 0.5 % IJ SOLN: INTRAMUSCULAR | Status: DC | PRN

## 2019-08-25 MED ORDER — ONDANSETRON HCL 4 MG/2ML IJ SOLN: 4.0000 mg | Freq: Once | INTRAMUSCULAR | Status: AC

## 2019-08-25 MED ORDER — ONDANSETRON HCL 4 MG PO TABS: 4.0000 mg | ORAL_TABLET | Freq: Four times a day (QID) | ORAL | Status: DC | PRN

## 2019-08-25 MED ORDER — MENTHOL 3 MG MT LOZG
1.0000 | LOZENGE | OROMUCOSAL | Status: DC | PRN
  Filled 2019-08-25: qty 9

## 2019-08-25 MED ORDER — AMLODIPINE BESYLATE 5 MG PO TABS
5.0000 mg | ORAL_TABLET | Freq: Every day | ORAL | Status: DC
  Administered 2019-08-25 – 2019-08-27 (×3): 5 mg via ORAL
  Filled 2019-08-25 (×3): qty 1

## 2019-08-25 MED ORDER — HYDROMORPHONE HCL 1 MG/ML IJ SOLN: 0.5000 mg | INTRAMUSCULAR | Status: DC | PRN

## 2019-08-25 MED ORDER — GABAPENTIN 300 MG PO CAPS: 300.0000 mg | ORAL_CAPSULE | Freq: Once | ORAL | Status: AC

## 2019-08-25 MED ORDER — ACETAMINOPHEN 10 MG/ML IV SOLN
INTRAVENOUS | Status: AC
  Filled 2019-08-25: qty 100

## 2019-08-25 MED ORDER — PROPOFOL 500 MG/50ML IV EMUL
INTRAVENOUS | Status: AC
  Filled 2019-08-25: qty 50

## 2019-08-25 MED ORDER — CEFAZOLIN SODIUM-DEXTROSE 2-4 GM/100ML-% IV SOLN
INTRAVENOUS | Status: AC
  Administered 2019-08-25: 2 g via INTRAVENOUS
  Filled 2019-08-25: qty 100

## 2019-08-25 MED ORDER — ALUM & MAG HYDROXIDE-SIMETH 200-200-20 MG/5ML PO SUSP: 30.0000 mL | ORAL | Status: DC | PRN

## 2019-08-25 MED ORDER — FENTANYL CITRATE (PF) 250 MCG/5ML IJ SOLN
INTRAMUSCULAR | Status: AC
  Filled 2019-08-25: qty 5

## 2019-08-25 MED ORDER — FERROUS SULFATE 325 (65 FE) MG PO TABS
325.0000 mg | ORAL_TABLET | Freq: Two times a day (BID) | ORAL | Status: DC
  Administered 2019-08-26 – 2019-08-28 (×5): 325 mg via ORAL
  Filled 2019-08-25 (×6): qty 1

## 2019-08-25 MED ORDER — FENTANYL CITRATE (PF) 100 MCG/2ML IJ SOLN
25.0000 ug | INTRAMUSCULAR | Status: DC | PRN
  Administered 2019-08-25 (×3): 25 ug via INTRAVENOUS

## 2019-08-25 MED ORDER — BISACODYL 10 MG RE SUPP
10.0000 mg | Freq: Every day | RECTAL | Status: DC | PRN
  Filled 2019-08-25: qty 1

## 2019-08-25 MED ORDER — TRAMADOL HCL 50 MG PO TABS: 50.0000 mg | ORAL_TABLET | ORAL | Status: DC | PRN

## 2019-08-25 MED ORDER — CEFAZOLIN SODIUM-DEXTROSE 2-4 GM/100ML-% IV SOLN
INTRAVENOUS | Status: AC
  Filled 2019-08-25: qty 100

## 2019-08-25 MED ORDER — ACETAMINOPHEN 10 MG/ML IV SOLN
INTRAVENOUS | Status: DC | PRN
  Administered 2019-08-25: 1000 mg via INTRAVENOUS

## 2019-08-25 MED ORDER — ACETAMINOPHEN 10 MG/ML IV SOLN
1000.0000 mg | Freq: Four times a day (QID) | INTRAVENOUS | Status: AC
  Administered 2019-08-25 – 2019-08-26 (×3): 1000 mg via INTRAVENOUS
  Filled 2019-08-25 (×3): qty 100

## 2019-08-25 MED ORDER — DEXAMETHASONE SODIUM PHOSPHATE 10 MG/ML IJ SOLN
INTRAMUSCULAR | Status: AC
  Filled 2019-08-25: qty 1

## 2019-08-25 MED ORDER — LACTATED RINGERS IV SOLN: INTRAVENOUS | Status: DC

## 2019-08-25 MED ORDER — TRANEXAMIC ACID-NACL 1000-0.7 MG/100ML-% IV SOLN: 1000.0000 mg | Freq: Once | INTRAVENOUS | Status: AC

## 2019-08-25 MED ORDER — BUPIVACAINE HCL (PF) 0.5 % IJ SOLN
INTRAMUSCULAR | Status: DC | PRN
  Administered 2019-08-25: 15 mg via INTRATHECAL

## 2019-08-25 MED ORDER — ACETAMINOPHEN 325 MG PO TABS: 325.0000 mg | ORAL_TABLET | Freq: Four times a day (QID) | ORAL | Status: DC | PRN

## 2019-08-25 MED ORDER — PHENYLEPHRINE HCL (PRESSORS) 10 MG/ML IV SOLN
INTRAVENOUS | Status: DC | PRN
  Administered 2019-08-25 (×3): 200 ug via INTRAVENOUS

## 2019-08-25 MED ORDER — METOCLOPRAMIDE HCL 10 MG PO TABS
10.0000 mg | ORAL_TABLET | Freq: Three times a day (TID) | ORAL | Status: AC
  Administered 2019-08-25 – 2019-08-27 (×6): 10 mg via ORAL
  Filled 2019-08-25 (×6): qty 1

## 2019-08-25 MED ORDER — GABAPENTIN 300 MG PO CAPS
300.0000 mg | ORAL_CAPSULE | Freq: Every day | ORAL | Status: DC
  Administered 2019-08-26 – 2019-08-27 (×3): 300 mg via ORAL
  Filled 2019-08-25 (×3): qty 1

## 2019-08-25 MED ORDER — PHENYLEPHRINE HCL (PRESSORS) 10 MG/ML IV SOLN
INTRAVENOUS | Status: AC
  Filled 2019-08-25: qty 1

## 2019-08-25 MED ORDER — PROPOFOL 500 MG/50ML IV EMUL
INTRAVENOUS | Status: DC | PRN
  Administered 2019-08-25: 40 ug/kg/min via INTRAVENOUS

## 2019-08-25 MED ORDER — EPHEDRINE SULFATE 50 MG/ML IJ SOLN
INTRAMUSCULAR | Status: DC | PRN
  Administered 2019-08-25: 25 mg via INTRAVENOUS

## 2019-08-25 MED ORDER — NEOMYCIN-POLYMYXIN B GU 40-200000 IR SOLN
Status: DC | PRN
  Administered 2019-08-25: 16 mL

## 2019-08-25 MED ORDER — CELECOXIB 200 MG PO CAPS: 400.0000 mg | ORAL_CAPSULE | Freq: Once | ORAL | Status: AC

## 2019-08-25 MED ORDER — CEFAZOLIN SODIUM-DEXTROSE 2-4 GM/100ML-% IV SOLN
2.0000 g | Freq: Four times a day (QID) | INTRAVENOUS | Status: AC
  Administered 2019-08-25 – 2019-08-26 (×3): 2 g via INTRAVENOUS
  Filled 2019-08-25 (×5): qty 100

## 2019-08-25 MED ORDER — GABAPENTIN 300 MG PO CAPS
ORAL_CAPSULE | ORAL | Status: AC
  Administered 2019-08-25: 300 mg via ORAL
  Filled 2019-08-25: qty 1

## 2019-08-25 SURGICAL SUPPLY — 64 items
BLADE DRUM FLTD (BLADE) ×3 IMPLANT
BLADE SAW 90X25X1.19 OSCILLAT (BLADE) ×3 IMPLANT
CANISTER SUCT 1200ML W/VALVE (MISCELLANEOUS) ×3 IMPLANT
CANISTER SUCT 3000ML PPV (MISCELLANEOUS) ×6 IMPLANT
CARTRIDGE OIL MAESTRO DRILL (MISCELLANEOUS) ×1 IMPLANT
COVER WAND RF STERILE (DRAPES) ×3 IMPLANT
CUP ACETBLR 52 OD 100 SERIES (Hips) ×2 IMPLANT
DIFFUSER DRILL AIR PNEUMATIC (MISCELLANEOUS) ×3 IMPLANT
DRAPE 3/4 80X56 (DRAPES) ×3 IMPLANT
DRAPE INCISE IOBAN 66X60 STRL (DRAPES) ×3 IMPLANT
DRSG DERMACEA 8X12 NADH (GAUZE/BANDAGES/DRESSINGS) ×3 IMPLANT
DRSG MEPILEX SACRM 8.7X9.8 (GAUZE/BANDAGES/DRESSINGS) ×2 IMPLANT
DRSG OPSITE POSTOP 4X12 (GAUZE/BANDAGES/DRESSINGS) ×3 IMPLANT
DRSG OPSITE POSTOP 4X14 (GAUZE/BANDAGES/DRESSINGS) IMPLANT
DRSG TEGADERM 4X4.75 (GAUZE/BANDAGES/DRESSINGS) ×3 IMPLANT
DURAPREP 26ML APPLICATOR (WOUND CARE) ×3 IMPLANT
ELECT REM PT RETURN 9FT ADLT (ELECTROSURGICAL) ×3
ELECTRODE REM PT RTRN 9FT ADLT (ELECTROSURGICAL) ×1 IMPLANT
GLOVE BIO SURGEON STRL SZ7.5 (GLOVE) ×6 IMPLANT
GLOVE BIOGEL M STRL SZ7.5 (GLOVE) ×6 IMPLANT
GLOVE BIOGEL PI IND STRL 7.5 (GLOVE) ×1 IMPLANT
GLOVE BIOGEL PI INDICATOR 7.5 (GLOVE) ×2
GLOVE INDICATOR 8.0 STRL GRN (GLOVE) ×3 IMPLANT
GOWN STRL REUS W/ TWL LRG LVL3 (GOWN DISPOSABLE) ×2 IMPLANT
GOWN STRL REUS W/ TWL XL LVL3 (GOWN DISPOSABLE) ×1 IMPLANT
GOWN STRL REUS W/TWL LRG LVL3 (GOWN DISPOSABLE) ×4
GOWN STRL REUS W/TWL XL LVL3 (GOWN DISPOSABLE) ×2
HEAD M SROM 36MM 2 (Hips) IMPLANT
HEMOVAC 400CC 10FR (MISCELLANEOUS) ×3 IMPLANT
HOLDER FOLEY CATH W/STRAP (MISCELLANEOUS) ×3 IMPLANT
HOOD PEEL AWAY FLYTE STAYCOOL (MISCELLANEOUS) ×6 IMPLANT
KIT TURNOVER KIT A (KITS) ×3 IMPLANT
LINER ACETAB NEUTRAL 36ID 520D (Liner) ×2 IMPLANT
MANIFOLD NEPTUNE II (INSTRUMENTS) ×3 IMPLANT
NDL SAFETY ECLIPSE 18X1.5 (NEEDLE) ×1 IMPLANT
NEEDLE HYPO 18GX1.5 SHARP (NEEDLE) ×2
NS IRRIG 500ML POUR BTL (IV SOLUTION) ×3 IMPLANT
OIL CARTRIDGE MAESTRO DRILL (MISCELLANEOUS) ×3
PACK HIP PROSTHESIS (MISCELLANEOUS) ×3 IMPLANT
PENCIL SMOKE ULTRAEVAC 22 CON (MISCELLANEOUS) ×3 IMPLANT
PIN STEIN THRED 5/32 (Pin) ×3 IMPLANT
PULSAVAC PLUS IRRIG FAN TIP (DISPOSABLE) ×3
SOL .9 NS 3000ML IRR  AL (IV SOLUTION) ×2
SOL .9 NS 3000ML IRR UROMATIC (IV SOLUTION) ×1 IMPLANT
SOL PREP PVP 2OZ (MISCELLANEOUS) ×3
SOLUTION PREP PVP 2OZ (MISCELLANEOUS) ×1 IMPLANT
SPONGE DRAIN TRACH 4X4 STRL 2S (GAUZE/BANDAGES/DRESSINGS) ×3 IMPLANT
SROM M HEAD 36MM 2 (Hips) ×3 IMPLANT
STAPLER SKIN PROX 35W (STAPLE) ×3 IMPLANT
STEM FEM CMNTLSS SM AML 13.5 (Hips) ×2 IMPLANT
SUT ETHIBOND #5 BRAIDED 30INL (SUTURE) ×3 IMPLANT
SUT VIC AB 0 CT1 36 (SUTURE) ×3 IMPLANT
SUT VIC AB 1 CT1 36 (SUTURE) ×6 IMPLANT
SUT VIC AB 2-0 CT1 27 (SUTURE) ×2
SUT VIC AB 2-0 CT1 TAPERPNT 27 (SUTURE) ×1 IMPLANT
SYR 10ML LL (SYRINGE) ×2 IMPLANT
SYR 20ML LL LF (SYRINGE) ×3 IMPLANT
SYR TOOMEY IRRIG 70ML (MISCELLANEOUS) ×3
SYRINGE TOOMEY IRRIG 70ML (MISCELLANEOUS) IMPLANT
TAPE CLOTH 3X10 WHT NS LF (GAUZE/BANDAGES/DRESSINGS) ×3 IMPLANT
TAPE TRANSPORE STRL 2 31045 (GAUZE/BANDAGES/DRESSINGS) ×3 IMPLANT
TIP FAN IRRIG PULSAVAC PLUS (DISPOSABLE) ×1 IMPLANT
TOWEL OR 17X26 4PK STRL BLUE (TOWEL DISPOSABLE) ×3 IMPLANT
TRAY FOLEY MTR SLVR 16FR STAT (SET/KITS/TRAYS/PACK) ×3 IMPLANT

## 2019-08-25 NOTE — Op Note (Signed)
OPERATIVE NOTE  DATE OF SURGERY:  08/25/2019  PATIENT NAME:  Gina Middleton   DOB: Apr 23, 1935  MRN: [id number]  PRE-OPERATIVE DIAGNOSIS: Degenerative arthrosis of the right hip, primary  POST-OPERATIVE DIAGNOSIS:  Same  PROCEDURE:  Right total hip arthroplasty  SURGEON:  Marciano Sequin. M.D.  ASSISTANT: Cassell Smiles, PA-C (present and scrubbed throughout the case, critical for assistance with exposure, retraction, instrumentation, and closure)  ANESTHESIA: spinal  ESTIMATED BLOOD LOSS: 150 mL  FLUIDS REPLACED: 1600 mL of crystalloid  DRAINS: 2 medium drains to a Hemovac reservoir  IMPLANTS UTILIZED: DePuy 13.5 mm small stature AML femoral stem, 52 mm OD Pinnacle 100 acetabular component, neutral Pinnacle Altrx polyethylene insert, and a 36 mm M-SPEC -2 mm hip ball  INDICATIONS FOR SURGERY: Gina Middleton is a 84 y.o. year old femalele with a long history of progressive hip and groin  pain. X-rays demonstrated severe degenerative changes. The patient had not seen any significant improvement despite conservative nonsurgical intervention. After discussion of the risks and benefits of surgical intervention, the patient expressed understanding of the risks benefits and agree with plans for total hip arthroplasty.   The risks, benefits, and alternatives were discussed at length including but not limited to the risks of infection, bleeding, nerve injury, stiffness, blood clots, the need for revision surgery, limb length inequality, dislocation, cardiopulmonary complications, among others, and they were willing to proceed.  PROCEDURE IN DETAIL: The patient was brought into the operating room and, after adequate spinal anesthesia was achieved, the patient was placed in a left lateral decubitus position. Axillary roll was placed and all bony prominences were well-padded. The patient's right hip was cleaned and prepped with alcohol and DuraPrep and draped in the usual sterile fashion. A  "timeout" was performed as per usual protocol. A lateral curvilinear incision was made gently curving towards the posterior superior iliac spine. The IT band was incised in line with the skin incision and the fibers of the gluteus maximus were split in line. The piriformis tendon was identified, skeletonized, and incised at its insertion to the proximal femur and reflected posteriorly. A T type posterior capsulotomy was performed. Prior to dislocation of the femoral head, a threaded Steinmann pin was inserted through a separate stab incision into the pelvis superior to the acetabulum and bent in the form of a stylus so as to assess limb length and hip offset throughout the procedure. The femoral head was then dislocated posteriorly. Inspection of the femoral head demonstrated severe degenerative changes with full-thickness loss of articular cartilage. The femoral neck cut was performed using an oscillating saw. The anterior capsule was elevated off of the femoral neck using a periosteal elevator. Attention was then directed to the acetabulum. The remnant of the labrum was excised using electrocautery. Inspection of the acetabulum also demonstrated significant degenerative changes. The acetabulum was reamed in sequential fashion up to a 51 mm diameter. Good punctate bleeding bone was encountered. A 52 mm Pinnacle 100 acetabular component was positioned and impacted into place. Good scratch fit was appreciated. A neutral polyethylene trial was inserted.  Attention was then directed to the proximal femur. A hole for reaming of the proximal femoral canal was created using a high-speed burr. The femoral canal was reamed in sequential fashion up to a 13 mm diameter. This allowed for approximately 6 cm of scratch fit. Serial broaches were inserted up to a 13.5 mm small stature femoral broach. Calcar region was planed and a trial reduction was performed using  a 36 mm hip ball with a -2 mm neck length. Good equalization of  limb lengths and hip offset was appreciated and excellent stability was noted both anteriorly and posteriorly. Trial components were removed. The acetabular shell was irrigated with copious amounts of normal saline with antibiotic solution and suctioned dry. A neutral Pinnacle Altrx polyethylene insert was positioned and impacted into place. Next, a 13.5 mm small stature AML femoral stem was positioned and impacted into place. Excellent scratch fit was appreciated. A trial reduction was again performed with a 36 mm hip ball with a -2 mm neck length. Again, good equalization of limb lengths was appreciated and excellent stability appreciated both anteriorly and posteriorly. The hip was then dislocated and the trial hip ball was removed. The Morse taper was cleaned and dried. A 36 mm M-SPEC hip ball with a -2 mm neck length was placed on the trunnion and impacted into place. The hip was then reduced and placed through range of motion. Excellent stability was appreciated both anteriorly and posteriorly.  The wound was irrigated with copious amounts of normal saline with antibiotic solution and suctioned dry. Good hemostasis was appreciated. The posterior capsulotomy was repaired using #5 Ethibond. Piriformis tendon was reapproximated to the undersurface of the gluteus medius tendon using #5 Ethibond. Two medium drains were placed in the wound bed and brought out through separate stab incisions to be attached to a Hemovac reservoir. The IT band was reapproximated using interrupted sutures of #1 Vicryl. Subcutaneous tissue was approximated using first #0 Vicryl followed by #2-0 Vicryl. The skin was closed with skin staples.  The patient tolerated the procedure well and was transported to the recovery room in stable condition.   Marciano Sequin., M.D.

## 2019-08-25 NOTE — Progress Notes (Signed)
1430- Pt received on Pacu HOLD from [facility] PACU RN. Pt with complaints of nausea , notified Dr. Amie Critchley with orders received. Patient given zofran 4mg  IV x 1 with poor response. Patient offered crackers and unable to tolerate, offered gingerale , and able to sip.  1500-Patient able to void freely large amounts of clear yellow urine on bedpan. Linen changed. Pt also requested writer notify husband of updates. Spoke with Pt husband and notified that patient in holding area while awaiting bed. Offered patient to have husband visit and she declined stating '' I really just feel sick and don't want to visit.'' Husband  verbalized  Understanding.  1555-Reported off to [facility] RN to assume care for this patient. Patient in no acute distress at this time. Instructed and aware of how to use call light. VS stable.

## 2019-08-25 NOTE — Transfer of Care (Signed)
Immediate Anesthesia Transfer of Care Note  Patient: Gina Middleton  Procedure(s) Performed: TOTAL HIP ARTHROPLASTY (Right Hip)  Patient Location: PACU  Anesthesia Type:MAC combined with regional for post-op pain  Level of Consciousness: sedated and patient cooperative  Airway & Oxygen Therapy: Patient Spontanous Breathing and Patient connected to nasal cannula oxygen  Post-op Assessment: Report given to RN and Post -op Vital signs reviewed and stable  Post vital signs: Reviewed and stable  Last Vitals:  Vitals Value Taken Time  BP 112/61 08/25/19 1139  Temp 36.2 C 08/25/19 1139  Pulse 67 08/25/19 1141  Resp 18 08/25/19 1141  SpO2 100 % 08/25/19 1141  Vitals shown include unvalidated device data.  Last Pain:  Vitals:   08/25/19 0733  TempSrc: Tympanic  PainSc: 0-No pain         Complications: No complications documented.

## 2019-08-25 NOTE — H&P (Signed)
The patient has been re-examined, and the chart reviewed, and there have been no interval changes to the documented history and physical.    The risks, benefits, and alternatives have been discussed at length. The patient expressed understanding of the risks benefits and agreed with plans for surgical intervention.  Gina Middleton, Jr. M.D.    

## 2019-08-25 NOTE — Progress Notes (Signed)
15 minute given to 1A

## 2019-08-25 NOTE — Anesthesia Preprocedure Evaluation (Signed)
Anesthesia Evaluation  Patient identified by MRN, date of birth, ID band Patient awake    Reviewed: Allergy & Precautions, H&P , NPO status , Patient's Chart, lab work & pertinent test results  History of Anesthesia Complications (+) PONV and history of anesthetic complications  Airway Mallampati: III  TM Distance: <3 FB Neck ROM: limited    Dental  (+) Chipped   Pulmonary neg shortness of breath, pneumonia,    Pulmonary exam normal        Cardiovascular Exercise Tolerance: Good hypertension, (-) angina(-) Past MI and (-) DOE Normal cardiovascular exam     Neuro/Psych  Neuromuscular disease negative psych ROS   GI/Hepatic negative GI ROS, Neg liver ROS, neg GERD  ,  Endo/Other  negative endocrine ROS  Renal/GU Renal disease     Musculoskeletal  (+) Arthritis ,   Abdominal   Peds  Hematology negative hematology ROS (+)   Anesthesia Other Findings Past Medical History: No date: Arthritis No date: Complication of anesthesia No date: History of kidney stones No date: Hypertension 03/2017: Pneumonia 1980's: PONV (postoperative nausea and vomiting)     Comment:  n/v with lap choley  Past Surgical History: No date: APPENDECTOMY 1969: BACK SURGERY No date: CHOLECYSTECTOMY 04/09/2018: CYSTOSCOPY W/ RETROGRADES; Left     Comment:  Procedure: CYSTOSCOPY WITH RETROGRADE PYELOGRAM;                Surgeon: Abbie Sons, MD;  Location: [facility] ORS;                Service: Urology;  Laterality: Left; 04/09/2018: CYSTOSCOPY WITH STENT PLACEMENT; Left     Comment:  Procedure: CYSTOSCOPY WITH STENT PLACEMENT-LEFT;                Surgeon: Abbie Sons, MD;  Location: [facility] ORS;                Service: Urology;  Laterality: Left; 04/09/2018: CYSTOSCOPY WITH URETEROSCOPY; Left     Comment:  Procedure: POSSIBLE URETEROSCOPY-LEFT;  Surgeon:               Abbie Sons, MD;  Location: [facility] ORS;  Service:                Urology;  Laterality: Left;  BMI    Body Mass Index: 20.70 kg/m      Reproductive/Obstetrics negative OB ROS                             Anesthesia Physical Anesthesia Plan  ASA: III  Anesthesia Plan: Spinal   Post-op Pain Management:    Induction:   PONV Risk Score and Plan:   Airway Management Planned: Natural Airway and Nasal Cannula  Additional Equipment:   Intra-op Plan:   Post-operative Plan:   Informed Consent: I have reviewed the patients History and Physical, chart, labs and discussed the procedure including the risks, benefits and alternatives for the proposed anesthesia with the patient or authorized representative who has indicated his/her understanding and acceptance.     Dental Advisory Given  Plan Discussed with: Anesthesiologist, CRNA and Surgeon  Anesthesia Plan Comments: (Patient reports no bleeding problems and no anticoagulant use.  Plan for spinal with backup GA  Patient consented for risks of anesthesia including but not limited to:  - adverse reactions to medications - damage to eyes, teeth, lips or other oral mucosa - nerve damage due to positioning  - risk  of bleeding, infection and or nerve damage from spinal that could lead to paralysis - risk of headache or failed spinal - damage to teeth, lips or other oral mucosa - sore throat or hoarseness - damage to heart, brain, nerves, lungs, other parts of body or loss of life  Patient voiced understanding.)        Anesthesia Quick Evaluation

## 2019-08-25 NOTE — Anesthesia Procedure Notes (Signed)
Spinal  Patient location during procedure: OR Staffing Performed: resident/CRNA  Resident/CRNA: Gaynelle Cage, CRNA Preanesthetic Checklist Completed: patient identified, IV checked, site marked, risks and benefits discussed, surgical consent, monitors and equipment checked, pre-op evaluation and timeout performed Spinal Block Patient position: sitting Prep: Betadine and site prepped and draped Patient monitoring: heart rate, cardiac monitor, continuous pulse ox and blood pressure Approach: midline Location: L3-4 Injection technique: single-shot Needle Needle type: Sprotte and Pencan  Needle gauge: 24 G Needle length: 10 cm Needle insertion depth: 6 cm Assessment Sensory level: T8

## 2019-08-25 NOTE — Progress Notes (Signed)
Patient states that her nausea has improved some.

## 2019-08-26 ENCOUNTER — Encounter: Payer: Self-pay | Admitting: Orthopedic Surgery

## 2019-08-26 LAB — SURGICAL PATHOLOGY

## 2019-08-26 NOTE — Progress Notes (Signed)
Physical Therapy Treatment Patient Details Name: Gina Middleton MRN: [id number] DOB: [birth year] Today's Date: 08/26/2019    History of Present Illness Patient is a 84 year old female with Degenerative arthrosis of the right hip, s/p right total hip arthroplasty. Past medical history of HTN, back surgery, cystoscopy with stent placement.     PT Comments    Patient is making progress with functional mobility and with increased activity tolerance this session. No nausea is reported during session. Patient progressed to ambulating in hallway with cues for gait kinematics and use of rolling walker. Patient performed exercises seated and supine with instruction. Recommend PT follow up tomorrow to maximize function and safety in preparation for going home with family support.    Follow Up Recommendations  Home health PT     Equipment Recommendations  None recommended by PT    Recommendations for Other Services       Precautions / Restrictions Precautions Precautions: Posterior Hip;Fall Precaution Booklet Issued: Yes (comment) Precaution Comments: posterior hip precuations THA home exercise packet  Restrictions Weight Bearing Restrictions: Yes RLE Weight Bearing: Weight bearing as tolerated Other Position/Activity Restrictions: posterior hip precuations     Mobility  Bed Mobility Overal bed mobility: Needs Assistance Bed Mobility: Sit to Supine     Supine to sit: Min assist Sit to supine: Min assist   General bed mobility comments: assistance for RLE support. increased time required and cues for technique   Transfers Overall transfer level: Needs assistance Equipment used: Rolling walker (2 wheeled) Transfers: Sit to/from Stand Sit to Stand: Supervision Stand pivot transfers: Min guard       General transfer comment: verbal cues for technique and RLE positioning with sitting and standing   Ambulation/Gait Ambulation/Gait assistance: Min guard Gait Distance  (Feet): 60 Feet Assistive device: Rolling walker (2 wheeled) Gait Pattern/deviations: Step-to pattern;Decreased stride length;Antalgic Gait velocity: decreased    General Gait Details: verbal cues for technique using rolling walker. cues to increase step length. unable to progress to step through pattern at this time    Stairs             Wheelchair Mobility    Modified Rankin (Stroke Patients Only)       Balance Overall balance assessment: Needs assistance Sitting-balance support: No upper extremity supported;Feet supported Sitting balance-Leahy Scale: Good Sitting balance - Comments: steady static and dynamic sitting balance   Standing balance support: During functional activity;Bilateral upper extremity supported Standing balance-Leahy Scale: Fair Standing balance comment: patient relying havily on rolling walker for UE support in standing position for static and dynamic standing activity                             Cognition Arousal/Alertness: Awake/alert Behavior During Therapy: WFL for tasks assessed/performed Overall Cognitive Status: Within Functional Limits for tasks assessed                                 General Comments: patient reports nause has improved       Exercises Total Joint Exercises Ankle Circles/Pumps: AROM;Strengthening;Right;10 reps;Supine Quad Sets: AROM;Strengthening;Right;10 reps;Supine Gluteal Sets: AROM;Both;10 reps;Supine;Strengthening Towel Squeeze: AROM;Strengthening;Both;10 reps;Supine Short Arc Quad: AROM;Right;Strengthening;10 reps;Supine Heel Slides: AROM;Strengthening;Right;10 reps;Supine Hip ABduction/ADduction: AAROM;Strengthening;Right;10 reps;Supine Long Arc Quad: AROM;Right;10 reps;Seated Other Exercises Other Exercises: reviewed all exercises in home exercise packet. verbal cues for technique  Other Exercises: Instructed pt in posterior THP in the  context of dressing, functional transfers and  mobility and toileting. Pt able to recall 2/3 at start of session, 3/3 at end of session.    General Comments        Pertinent Vitals/Pain Pain Assessment: 0-10 Pain Score: 5  Pain Location: R hip Pain Descriptors / Indicators: Grimacing Pain Intervention(s): Monitored during session    Home Living Family/patient expects to be discharged to:: Private residence Living Arrangements: Spouse/significant other Available Help at Discharge: Family;Available 24 hours/day Type of Home: House Home Access: Stairs to enter;Ramped entrance Entrance Stairs-Rails: Right;Left;Can reach both Home Layout: One level Home Equipment: Walker - 2 wheels;Cane - quad;Cane - single point      Prior Function Level of Independence: Independent with assistive device(s)      Comments: patient ambulates with quad cane    PT Goals (current goals can now be found in the care plan section) Acute Rehab PT Goals Patient Stated Goal: to go home tomorrow  PT Goal Formulation: With patient Time For Goal Achievement: 09/09/19 Potential to Achieve Goals: Good Progress towards PT goals: Progressing toward goals    Frequency    BID      PT Plan Current plan remains appropriate    Co-evaluation              AM-PAC PT "6 Clicks" Mobility   Outcome Measure  Help needed turning from your back to your side while in a flat bed without using bedrails?: A Little Help needed moving from lying on your back to sitting on the side of a flat bed without using bedrails?: A Little Help needed moving to and from a bed to a chair (including a wheelchair)?: A Little Help needed standing up from a chair using your arms (e.g., wheelchair or bedside chair)?: A Little Help needed to walk in hospital room?: A Little Help needed climbing 3-5 steps with a railing? : A Little 6 Click Score: 18    End of Session Equipment Utilized During Treatment: Gait belt Activity Tolerance: Patient tolerated treatment well Patient  left: in bed;with call bell/phone within reach;with SCD's reapplied;with bed alarm set;with family/visitor present Nurse Communication: Mobility status PT Visit Diagnosis: Other abnormalities of gait and mobility (R26.89);Pain Pain - Right/Left: Right Pain - part of body: Hip     Time: 1435-1459 PT Time Calculation (min) (ACUTE ONLY): 24 min  Charges:  $Gait Training: 8-22 mins $Therapeutic Exercise: 8-22 mins                     Minna Merritts, PT, MPT   Percell Locus 08/26/2019, 3:09 PM

## 2019-08-26 NOTE — Evaluation (Signed)
Occupational Therapy Evaluation Patient Details Name: [name] MRN: [id number] DOB: December 02, 1935 Today's Date: 08/26/2019    History of Present Illness Patient currently declined PT, despite encourgaement, due to nausea. Patient has received nausea medication already. Discussed with nurse. PT will return later in attempts to complete PT evaluation.    Clinical Impression   Pt seen for OT evaluation this date, POD#1 from above surgery. Pt was independent in all ADL/IADL prior to surgery, however using QC for the past 4 months for mobility due to R hip pain. Pt is eager to return to PLOF with less pain and improved safety and independence; however she is limited by increased nausea during session. RN notified and arrived with medication. Pt currently requires Min A for sit to stand LB dressing using reacher and RW due to pain and limited AROM of R hip. Pt able to recall 2/3 posterior total hip precautions at start of session and unable to verbalize how to implement during ADL and mobility. Pt instructed in posterior total hip precautions and how to implement, self care skills, falls prevention strategies, home/routines modifications, DME/AE for LB bathing and dressing tasks and compression stocking mgt strategies. At end of session, pt able to recall 3/3 posterior total hip precautions. Pt would benefit from additional instruction in self care skills and techniques to help maintain precautions with or without assistive devices to support recall and carryover prior to discharge. Recommend HHOT upon discharge.       Follow Up Recommendations  Home health OT;Supervision/Assistance - 24 hour    Equipment Recommendations  3 in 1 bedside commode    Recommendations for Other Services       Precautions / Restrictions Precautions Precautions: Posterior Hip;Fall Precaution Booklet Issued: No Precaution Comments: total posterior hip precautions Restrictions Weight Bearing Restrictions:  Yes RLE Weight Bearing: Weight bearing as tolerated      Mobility Bed Mobility Overal bed mobility: Needs Assistance Bed Mobility: Supine to Sit     Supine to sit: Min assist     General bed mobility comments: Increased time and vc's for RLE mgt, HOB elevated  Transfers Overall transfer level: Needs assistance Equipment used: Rolling walker (2 wheeled) Transfers: Sit to/from [facility] Sit to Stand: Min assist Stand pivot transfers: Min guard       General transfer comment: Min A from bed with RW, CGA for stand pivot from recliner <> BSC    Balance Overall balance assessment: Needs assistance Sitting-balance support: No upper extremity supported;Feet supported Sitting balance-Leahy Scale: Good Sitting balance - Comments: steady static and dynamic sitting balance   Standing balance support: During functional activity;No upper extremity supported Standing balance-Leahy Scale: Fair Standing balance comment: able to maintain static standing balance with BUE unsupported during clothing mgt, requires RW for dynamic standing                           ADL either performed or assessed with clinical judgement   ADL Overall ADL's : Needs assistance/impaired                     Lower Body Dressing: Cueing for sequencing;Cueing for compensatory techniques;Cueing for safety;With adaptive equipment;Adhering to hip precautions;Sit to/from stand;Minimal assistance Lower Body Dressing Details (indicate cue type and reason): donned underwear using reacher, vc's for THP Toilet Transfer: RW;BSC;Stand-pivot;Cueing for sequencing;Cueing for safety;Min guard Toilet Transfer Details (indicate cue type and reason): cues for THP Toileting- Clothing Manipulation and Hygiene:  Min guard;Adhering to hip precautions;Cueing for sequencing;Sitting/lateral lean       Functional mobility during ADLs: Min guard;Rolling walker;Cueing for sequencing;Cueing for  safety General ADL Comments: Pt functional mobility limited this date 2/2 nausea. Standby A for LBD using RW and reacher.     Vision Baseline Vision/History: Wears glasses Wears Glasses: At all times       Perception     Praxis      Pertinent Vitals/Pain Pain Assessment: 0-10 Pain Score: 5  (after mobility) Pain Location: R hip Pain Descriptors / Indicators: Discomfort;Grimacing;Operative site guarding Pain Intervention(s): Limited activity within patient's tolerance;Monitored during session;Repositioned;Premedicated before session (Pt requesting nausea meds)     Hand Dominance     Extremity/Trunk Assessment Upper Extremity Assessment Upper Extremity Assessment: Overall WFL for tasks assessed;Generalized weakness (RUE grossly 4-/5, LUE grossly 4+/5)   Lower Extremity Assessment Lower Extremity Assessment: Generalized weakness;RLE deficits/detail RLE Deficits / Details: anticipated post-op deficits in strength/ROM       Communication Communication Communication: No difficulties   Cognition Arousal/Alertness: Awake/alert Behavior During Therapy: WFL for tasks assessed/performed Overall Cognitive Status: Within Functional Limits for tasks assessed                                     General Comments  Pt presents with increased nausea during session, no episodes of emesis. NT and RN notified, cold wash cloth provided and RN administered medication. Bed linens soiled and changed on arrival.    Exercises Other Exercises Other Exercises: Pt educated re: role of OT in acute care, safe DME/AE use for ADL, posterior THP, compression stocking mgt, falls prevention strategies and home/routine modifications. Handout provided. Other Exercises: Instructed pt in posterior THP in the context of dressing, functional transfers and mobility and toileting. Pt able to recall 2/3 at start of session, 3/3 at end of session.   Shoulder Instructions      Home Living  Family/patient expects to be discharged to:: Private residence Living Arrangements: Spouse/significant other Available Help at Discharge: Family;Available 24 hours/day (husband) Type of Home: House Home Access: Stairs to enter;Ramped entrance Entrance Stairs-Number of Steps: 5 Entrance Stairs-Rails: Right;Left;Can reach both Home Layout: One level     Bathroom Shower/Tub: Walk-in shower (with 1 step over)   Bathroom Toilet: Handicapped height     Home Equipment: Environmental consultant - 2 wheels;Cane - quad;Cane - single point (stand alone toilet riser)   Additional Comments: 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 She still drives. Has been ambulating with a QC for the past 4 months due to hip pain. 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        OT Problem List: Decreased strength;Decreased range of motion;Decreased activity tolerance;Impaired balance (sitting and/or standing);Decreased knowledge of precautions;Decreased knowledge of use of DME or AE;Pain      OT Treatment/Interventions: Self-care/ADL training;Therapeutic exercise;Therapeutic activities;DME and/or AE instruction;Patient/family education;Balance training    OT Goals(Current goals can be found in the care plan section) Acute Rehab OT Goals Patient Stated Goal: To not be nauseated during movement OT Goal Formulation: With patient Time For Goal Achievement: 09/09/19 Potential to Achieve Goals:  Good ADL Goals Pt Will Perform Lower Body Dressing: with modified independence;with adaptive equipment;sit to/from stand (using LRAD while independently maintaining posterior THP) Pt Will Transfer to Toilet: with modified independence;ambulating;bedside  commode;regular height toilet (using LRAD; BSC over toilet; while independently maintaining posterior THP) Additional ADL Goal #1: Pt will independently instruct caregiver in compression stocking mgt to maximize pt safety and independence  OT Frequency: Min 2X/week   Barriers to D/C:            Co-evaluation              AM-PAC OT "6 Clicks" Daily Activity     Outcome Measure Help from another person eating meals?: None Help from another person taking care of personal grooming?: None Help from another person toileting, which includes using toliet, bedpan, or urinal?: A Little Help from another person bathing (including washing, rinsing, drying)?: A Lot Help from another person to put on and taking off regular upper body clothing?: None Help from another person to put on and taking off regular lower body clothing?: A Little 6 Click Score: 20   End of Session Equipment Utilized During Treatment: Gait belt;Rolling walker Nurse Communication: Mobility status (Pt requests nausea medication)  Activity Tolerance: Other (comment) (Pt limited by nausea) Patient left: in chair;with call bell/phone within reach;with nursing/sitter in room (Chair alarm off and SCD's not reapplied, NT aware)  OT Visit Diagnosis: Other abnormalities of gait and mobility (R26.89);Muscle weakness (generalized) (M62.81);Pain Pain - Right/Left: Right Pain - part of body: Knee                Time: 0825-0921 OT Time Calculation (min): 56 min Charges:  OT General Charges $OT Visit: 1 Visit OT Evaluation $OT Eval Moderate Complexity: 1 Mod OT Treatments $Self Care/Home Management : 38-52 mins  Jerilynn Birkenhead, OTS 08/26/19, 11:23 AM

## 2019-08-26 NOTE — Evaluation (Signed)
Physical Therapy Evaluation Patient Details Name: Dezire Turk MRN: [id number] DOB: Jan 29, 1936 Today's Date: 08/26/2019   History of Present Illness  Patient is a 84 year old female with degenerative arthrosis of the right hip, s/p right total hip arthroplasty with posterior hip precautions. Past medical history of HTN, back surgery, cystoscopy with stent placement.   Clinical Impression  PT evaluation completed after multiple attempts unsuccessfully due to nausea. Patient currently feels that nausea is controlled and reports 5/10 right hip pain during session. Patient was able to ambulate a short distance with Min guard with rolling walker for support and cues for technique using assistive device. No loss of balance in standing position with use of rolling walker. Exercise handout provided and therapist reviewed posterior hip precautions with cues required to maintain precautions with functional mobility. Recommend PT to address functional limitations listed below to maximize independence and safety in preparation for discharge home with family support.     Follow Up Recommendations Home health PT    Equipment Recommendations  None recommended by PT    Recommendations for Other Services       Precautions / Restrictions Precautions Precautions: Posterior Hip;Fall Precaution Booklet Issued: Yes (comment) Precaution Comments: posterior hip precuations THA home exercise packet  Restrictions Weight Bearing Restrictions: Yes RLE Weight Bearing: Weight bearing as tolerated Other Position/Activity Restrictions: posterior hip precuations       Mobility  Bed Mobility Overal bed mobility: Needs Assistance Bed Mobility: Sit to Supine     Supine to sit: Min assist     General bed mobility comments: assistance for RLE support. verbal cues for technique. patient reports she plans to sleep in a recliner at home   Transfers Overall transfer level: Needs assistance Equipment used:  Rolling walker (2 wheeled) Transfers: Sit to/from Stand Sit to Stand: Min guard Stand pivot transfers: Min guard       General transfer comment: verbal cues for technique, safe hand placement, and RLE positioning to maintain posterior hip precautions with activity   Ambulation/Gait Ambulation/Gait assistance: Min guard Gait Distance (Feet): 20 Feet Assistive device: Rolling walker (2 wheeled) Gait Pattern/deviations: Step-to pattern;Decreased stride length;Antalgic Gait velocity: decreased    General Gait Details: Patient declined ambulating in hallway at this time due to fear of nausea, although nausea reportedly did not worsen during activity   Stairs            Wheelchair Mobility    Modified Rankin (Stroke Patients Only)       Balance Overall balance assessment: Needs assistance Sitting-balance support: No upper extremity supported;Feet supported Sitting balance-Leahy Scale: Good Sitting balance - Comments: steady static and dynamic sitting balance   Standing balance support: During functional activity;Bilateral upper extremity supported Standing balance-Leahy Scale: Fair Standing balance comment: patient relying havily on rolling walker for UE support in standing position for static and dynamic standing activity                              Pertinent Vitals/Pain Pain Assessment: 0-10 Pain Score: 5  Pain Location: R hip Pain Descriptors / Indicators: Grimacing Pain Intervention(s): Monitored during session;Limited activity within patient's tolerance;Ice applied (ice pack applied right hip after activity )    Home Living Family/patient expects to be discharged to:: Private residence Living Arrangements: Spouse/significant other Available Help at Discharge: Family;Available 24 hours/day Type of Home: House Home Access: Stairs to enter;Ramped entrance Entrance Stairs-Rails: Right;Left;Can reach both Entrance Stairs-Number of Steps:  5 Home Layout:  One level Home Equipment: Walker - 2 wheels;Cane - quad;Cane - single point Additional Comments: 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; home information reported for the home pt will discharge to    Prior Function Level of Independence: Independent with assistive device(s)         Comments: patient ambulates with quad cane      Hand Dominance        Extremity/Trunk Assessment   Upper Extremity Assessment Upper Extremity Assessment: Generalized weakness LUE Deficits / Details: WNL observed with fucntional activity     Lower Extremity Assessment Lower Extremity Assessment: RLE deficits/detail RLE Deficits / Details: patient able activate ankle movement, hip movement within limtis of posterior hip precautions, and perform LAQ independently  RLE Sensation: WNL       Communication   Communication: No difficulties  Cognition Arousal/Alertness: Awake/alert Behavior During Therapy: WFL for tasks assessed/performed Overall Cognitive Status: Within Functional Limits for tasks assessed                                 General Comments: patient reports nause has improved       General Comments General comments (skin integrity, edema, etc.): Pt presents with increased nausea during session, no episodes of emesis. NT and RN notified, cold wash cloth provided and RN administered medication. Bed linens soiled and changed on arrival.    Exercises Total Joint Exercises Ankle Circles/Pumps: AROM;Strengthening;Right;10 reps (performed from reclined seated position ) Hip ABduction/ADduction: AAROM;Strengthening;Right;10 reps (performed from reclined sitting position ) Long Arc Quad: AROM;Right;10 reps;Strengthening;Seated Other Exercises Other Exercises: verbal cues for technique. exercise handout provided  Other Exercises: Instructed pt in posterior THP in the context of dressing, functional transfers and mobility and toileting. Pt able to recall 2/3 at  start of session, 3/3 at end of session.   Assessment/Plan    PT Assessment Patient needs continued PT services  PT Problem List Decreased strength;Decreased activity tolerance;Decreased balance;Decreased mobility;Decreased range of motion;Decreased knowledge of use of DME;Decreased safety awareness;Decreased knowledge of precautions;Pain       PT Treatment Interventions DME instruction;Gait training;Stair training;Functional mobility training;Therapeutic activities;Therapeutic exercise;Balance training;Patient/family education    PT Goals (Current goals can be found in the Care Plan section)  Acute Rehab PT Goals Patient Stated Goal: To not be nauseated during movement PT Goal Formulation: With patient Time For Goal Achievement: 09/09/19 Potential to Achieve Goals: Good    Frequency BID   Barriers to discharge        Co-evaluation               AM-PAC PT "6 Clicks" Mobility  Outcome Measure Help needed turning from your back to your side while in a flat bed without using bedrails?: A Little Help needed moving from lying on your back to sitting on the side of a flat bed without using bedrails?: A Little Help needed moving to and from a bed to a chair (including a wheelchair)?: A Little Help needed standing up from a chair using your arms (e.g., wheelchair or bedside chair)?: A Little Help needed to walk in hospital room?: A Little Help needed climbing 3-5 steps with a railing? : A Little 6 Click Score: 18    End of Session Equipment Utilized During Treatment: Gait belt Activity Tolerance: Patient tolerated treatment well Patient left: in bed;with call bell/phone within reach;with SCD's reapplied;with bed alarm set (ice pack  applied to right hip ) Nurse Communication: Mobility status PT Visit Diagnosis: Other abnormalities of gait and mobility (R26.89);Pain Pain - Right/Left: Right Pain - part of body: Hip    Time: 1110-1140 PT Time Calculation (min) (ACUTE ONLY):  30 min   Charges:   PT Evaluation $PT Eval Moderate Complexity: 1 Mod PT Treatments $Therapeutic Exercise: 8-22 mins        Minna Merritts, PT, MPT  Percell Locus 08/26/2019, 11:54 AM

## 2019-08-26 NOTE — Plan of Care (Signed)
  Problem: Health Behavior/Discharge Planning: Goal: Ability to manage health-related needs will improve Outcome: Progressing   Problem: Activity: Goal: Risk for activity intolerance will decrease Outcome: Progressing   Problem: Coping: Goal: Level of anxiety will decrease Outcome: Progressing   

## 2019-08-26 NOTE — Anesthesia Postprocedure Evaluation (Signed)
Anesthesia Post Note  Patient: Gina Middleton  Procedure(s) Performed: TOTAL HIP ARTHROPLASTY (Right Hip)  Patient location during evaluation: Nursing Unit Anesthesia Type: Spinal Level of consciousness: oriented and awake and alert Pain management: pain level controlled Vital Signs Assessment: post-procedure vital signs reviewed and stable Respiratory status: spontaneous breathing and respiratory function stable Cardiovascular status: blood pressure returned to baseline and stable Postop Assessment: no headache, no backache, no apparent nausea or vomiting and patient able to bend at knees (Pt reports nausea and vomiting all day yesterday. States symptoms have resolved this morning) Anesthetic complications: no   No complications documented.   Last Vitals:  Vitals:   08/26/19 0018 08/26/19 0346  BP: 130/74 104/60  Pulse: 69 67  Resp: 16 16  Temp: (!) 36.4 C 36.4 C  SpO2: 97% 98%    Last Pain:  Vitals:   08/26/19 0346  TempSrc: Oral  PainSc:                  Caryl Asp

## 2019-08-26 NOTE — TOC Progression Note (Signed)
Transition of Care [facility]) - Progression Note    Patient Details  Name: Gina Middleton MRN: [id number] Date of Birth: [birth year]  Transition of Care [facility]) CM/SW [place], RN Phone Number: 08/26/2019, 4:41 PM  Clinical Narrative:    Met with the patient to Discuss DC plan and needs, she lives at home with her husband, They will be residing in their single story home at [place] in [place], I notified Helene Kelp with [facility] of the address She needs a 3 in 1 , I notified Zack with [employer] she has a ramped entrance, she also has a rolling walker, She has transportation with her husband and is up to date PCP and can afford her medications,  No additional needs at this time   Expected Discharge Plan: [place] Barriers to Discharge: Continued Medical Work up  Expected Discharge Plan and Services Expected Discharge Plan: [place]   Discharge Planning Services: CM Consult   Living arrangements for the past 2 months: Single Family Home                 DME Arranged: 3-N-1 DME Agency: [facility] Date DME Agency Contacted: 08/26/19 Time DME Agency Contacted: [phone] Representative spoke with at DME Agency: zack HH Arranged: PT, OT [place] Agency: [facility] (formerly [employer]) Date [place]: 08/26/19 Time [place]: 1640 Representative spoke with at [place]: teresa   Social Determinants of Health ([place]) Interventions    Readmission Risk Interventions No flowsheet data found.

## 2019-08-26 NOTE — Progress Notes (Signed)
PT Cancellation Note  Patient Details Name: Gina Middleton MRN: [id number] DOB: 1936-02-07   Cancelled Treatment:    Reason Eval/Treat Not Completed: Medical issues which prohibited therapy. Patient currently declined PT, despite encouragement, due to nausea. Patient has received nausea medication already. Discussed with nurse. PT will return later in attempts to complete PT evaluation.   Percell Locus 08/26/2019, 10:11 AM

## 2019-08-26 NOTE — Progress Notes (Signed)
  Subjective: 1 Day Post-Op Procedure(s) (LRB): TOTAL HIP ARTHROPLASTY (Right) Patient reports pain as well-controlled.   Patient is well, but had significant nausea and vomiting after surgery. Plan is to go Home after hospital stay. Negative for chest pain and shortness of breath Fever: no Gastrointestinal: negative for nausea and vomiting this AM.   Patient has not had a bowel movement.  Objective: Vital signs in last 24 hours: Temp:  [94.7 F (34.8 C)-97.6 F (36.4 C)] 97.5 F (36.4 C) (07/08 0811) Pulse Rate:  [61-79] 73 (07/08 0811) Resp:  [10-25] 18 (07/08 0811) BP: (104-157)/(60-97) 119/97 (07/08 0811) SpO2:  [94 %-100 %] 98 % (07/08 0811)  Intake/Output from previous day:  Intake/Output Summary (Last 24 hours) at 08/26/2019 0927 Last data filed at 08/25/2019 1844 Gross per 24 hour  Intake 3380 ml  Output 371 ml  Net 3009 ml    Intake/Output this shift: No intake/output data recorded.  Labs: No results for input(s): HGB in the last 72 hours. No results for input(s): WBC, RBC, HCT, PLT in the last 72 hours. No results for input(s): NA, K, CL, CO2, BUN, CREATININE, GLUCOSE, CALCIUM in the last 72 hours. No results for input(s): LABPT, INR in the last 72 hours.   EXAM General - Patient is Alert, Appropriate and Oriented Extremity - Neurovascular intact Dorsiflexion/Plantar flexion intact Compartment soft Dressing/Incision -Hemovac in place. , ice pack in place Motor Function - intact, moving foot and toes well on exam.  Cardiovascular- Regular rate and rhythm, no murmurs/rubs/gallops Respiratory- Lungs clear to auscultation bilaterally Gastrointestinal- soft, nontender and active bowel sounds   Assessment/Plan: 1 Day Post-Op Procedure(s) (LRB): TOTAL HIP ARTHROPLASTY (Right) Active Problems:   H/O total hip arthroplasty  Estimated body mass index is 20.7 kg/m as calculated from the following:   Height as of this encounter: 5\' 3"  (1.6 m).   Weight as of  this encounter: 53 kg. Advance diet Up with therapy w/ posterior hip precautions  Plan for discharge tomorrow    DVT Prophylaxis - Lovenox and Ted hose Weight-Bearing as tolerated to right leg  Cassell Smiles, PA-C [facility] 08/26/2019, 9:27 AM

## 2019-08-27 MED ORDER — CELECOXIB 200 MG PO CAPS: 200.0000 mg | ORAL_CAPSULE | Freq: Two times a day (BID) | ORAL | 0 refills | Status: DC

## 2019-08-27 MED ORDER — ENOXAPARIN SODIUM 40 MG/0.4ML ~~LOC~~ SOLN
40.0000 mg | SUBCUTANEOUS | 0 refills | Status: DC
Start: 2019-08-27 — End: 2019-11-08

## 2019-08-27 MED ORDER — OXYCODONE HCL 5 MG PO TABS: 5.0000 mg | ORAL_TABLET | ORAL | 0 refills | Status: DC | PRN

## 2019-08-27 MED ORDER — TRAMADOL HCL 50 MG PO TABS: 50.0000 mg | ORAL_TABLET | ORAL | 0 refills | Status: DC | PRN

## 2019-08-27 NOTE — Progress Notes (Signed)
Patient slept throughout the night. No complaints of pain. Ice pack on. Hemovac drained a total of 220.

## 2019-08-27 NOTE — Care Management Important Message (Signed)
Important Message  Patient Details  Name: Gina Middleton MRN: [id number] Date of Birth: Sep 27, 1935   Medicare Important Message Given:  N/A - LOS <3 / Initial given by admissions     Gina Middleton 08/27/2019, 9:30 AM

## 2019-08-27 NOTE — Progress Notes (Signed)
Physical Therapy Treatment Patient Details Name: Gina Middleton MRN: [id number] DOB: [birth year] Today's Date: 08/27/2019    History of Present Illness Patient is a 84 year old female with Degenerative arthrosis of the right hip, s/p right total hip arthroplasty. Past medical history of HTN, back surgery, cystoscopy with stent placement.     PT Comments    Pt up to chair at entry, agreeable to participate, reports to be comfortable. Pt able to cite 3 of 3 posterior hip precautions when cued. Pt able to maintain mobility within precautions during session. Min-guard assist for transfers and AMB c RW. Pt needs excessive cues for technique with RW, often to close to person, Rt toes progressing beyond wheel line with each step. Pt requires additional rest breaks in gait, but no LOB. Pt unable to maintain gait quality for entirety of length due to progressive pain in walking. Will continue to follow.     Follow Up Recommendations  Home health PT     Equipment Recommendations  None recommended by PT    Recommendations for Other Services       Precautions / Restrictions Precautions Precautions: Posterior Hip;Fall Precaution Booklet Issued: Yes (comment) Precaution Comments: posterior hip precuations THA home exercise packet  Restrictions Weight Bearing Restrictions: Yes RLE Weight Bearing: Weight bearing as tolerated Other Position/Activity Restrictions: posterior hip precuations     Mobility  Bed Mobility               General bed mobility comments: in chair at entry  Transfers Overall transfer level: Needs assistance Equipment used: Rolling walker (2 wheeled) Transfers: Sit to/from Stand Sit to Stand: Supervision         General transfer comment: maintains precautions well.  Ambulation/Gait Ambulation/Gait assistance: Min guard Gait Distance (Feet): 140 Feet Assistive device: Rolling walker (2 wheeled) Gait Pattern/deviations: Step-to pattern;Decreased stride  length;Antalgic Gait velocity: 0.64m/s   General Gait Details: almost a shuffle at times, fluctuates between 2-point and 3-point, latter 50%  becomes more altalgic; stops tp recover multiple times, no LOB. (RW often too close to pelvis, cues to move anterior to [facility])   Stairs             Wheelchair Mobility    Modified Rankin (Stroke Patients Only)       Balance                                            Cognition Arousal/Alertness: Awake/alert Behavior During Therapy: WFL for tasks assessed/performed Overall Cognitive Status: Within Functional Limits for tasks assessed                                        Exercises Total Joint Exercises Ankle Circles/Pumps: AROM;Supine;20 reps;Both;Seated Long Arc Quad: AROM;Seated;15 reps;Both    General Comments        Pertinent Vitals/Pain Pain Assessment: 0-10 Pain Score: 2  Pain Location: R hip Pain Intervention(s): Limited activity within patient's tolerance;Monitored during session;Premedicated before session    Home Living                      Prior Function            PT Goals (current goals can now be found in the care plan section) Acute Rehab PT  Goals Patient Stated Goal: to go home tomorrow  PT Goal Formulation: With patient Time For Goal Achievement: 09/09/19 Potential to Achieve Goals: Good Progress towards PT goals: Progressing toward goals    Frequency    BID      PT Plan Current plan remains appropriate    Co-evaluation              AM-PAC PT "6 Clicks" Mobility   Outcome Measure  Help needed turning from your back to your side while in a flat bed without using bedrails?: A Little Help needed moving from lying on your back to sitting on the side of a flat bed without using bedrails?: A Little Help needed moving to and from a bed to a chair (including a wheelchair)?: A Little Help needed standing up from a chair using your arms (e.g.,  wheelchair or bedside chair)?: A Little Help needed to walk in hospital room?: A Little Help needed climbing 3-5 steps with a railing? : A Little 6 Click Score: 18    End of Session Equipment Utilized During Treatment: Gait belt Activity Tolerance: Patient tolerated treatment well;Patient limited by pain Patient left: with call bell/phone within reach;in chair;with chair alarm set Nurse Communication: Mobility status PT Visit Diagnosis: Other abnormalities of gait and mobility (R26.89);Pain Pain - Right/Left: Right Pain - part of body: Hip     Time: 1003-4961 PT Time Calculation (min) (ACUTE ONLY): 19 min  Charges:  $Gait Training: 8-22 mins                     1:57 PM, 08/27/19 Etta Grandchild, PT, DPT Physical Therapist - [facility]  [phone] ([place])   [place] C 08/27/2019, 1:48 PM

## 2019-08-27 NOTE — Progress Notes (Signed)
Physical Therapy Treatment Patient Details Name: Gina Middleton MRN: [id number] DOB: 14-May-1935 Today's Date: 08/27/2019    History of Present Illness Patient is a 84 year old female with Degenerative arthrosis of the right hip, s/p right total hip arthroplasty. Past medical history of HTN, back surgery, cystoscopy with stent placement.     PT Comments    Pt in recliner, wants help for BR needs, assisted to BR c RW, then standby safety for self-pericare. Pt AMB well, similar to morning but shorter distance to allow time for stairs and HEP training. Full HEP review with handout, pt needs assist for heel slides and ABDCT, charged with performing later with assist from husband. Pt does well with performance on stairs, cues for sequencig, no physical assist needed. Pt ready for DC from PT standpoint. Pt left in bed, all needs met, RN made aware.     Follow Up Recommendations  Home health PT     Equipment Recommendations  None recommended by PT    Recommendations for Other Services       Precautions / Restrictions Precautions Precautions: Posterior Hip;Fall Precaution Booklet Issued: Yes (comment) Precaution Comments: posterior hip precuations THA home exercise packet  Restrictions RLE Weight Bearing: Weight bearing as tolerated Other Position/Activity Restrictions: posterior hip precuations     Mobility  Bed Mobility Overal bed mobility: Needs Assistance Bed Mobility: Sit to Supine     Supine to sit: Min assist     General bed mobility comments: needs asssit of RLE into bed, education on crossing precaution  Transfers Overall transfer level: Needs assistance Equipment used: Rolling walker (2 wheeled) Transfers: Sit to/from Stand Sit to Stand: Supervision Stand pivot transfers: Supervision       General transfer comment: maintains precautions well without cue  Ambulation/Gait Ambulation/Gait assistance: Min guard Gait Distance (Feet): 50 Feet (11f this  morning) Assistive device: Rolling walker (2 wheeled) Gait Pattern/deviations: Step-to pattern;Decreased stride length;Antalgic Gait velocity: 0.164m   General Gait Details: almost a shuffle at times, fluctuates between 2-point and 3-point, latter 50%  becomes more altalgic; stops tp recover multiple times, no LOB. (RW often too close to pelvis, cues to move anterior to COM)   Stairs Stairs: Yes Stairs assistance: Min guard Stair Management: Two rails;Step to pattern;Forwards Number of Stairs: 4     Wheelchair Mobility    Modified Rankin (Stroke Patients Only)       Balance Overall balance assessment: Modified Independent Sitting-balance support: No upper extremity supported;Feet supported                                        Cognition Arousal/Alertness: Awake/alert Behavior During Therapy: WFL for tasks assessed/performed Overall Cognitive Status: Within Functional Limits for tasks assessed                                        Exercises Total Joint Exercises Ankle Circles/Pumps: AROM;Supine;20 reps;Both;Seated Quad Sets: AROM;Strengthening;Right;10 reps;Supine Gluteal Sets: AROM;10 reps;Supine;Strengthening;Right Towel Squeeze: AROM;Strengthening;Both;10 reps;Supine Short Arc Quad: AROM;Right;Strengthening;10 reps;Supine Heel Slides: Strengthening;Right;10 reps;Supine;AAROM Hip ABduction/ADduction: Right;10 reps;Supine;PROM;Limitations Hip Abduction/Adduction Limitations: very guarded, does not permit much more than 10 degrees due to pain Long Arc Quad: AROM;Seated;15 reps;Both    General Comments        Pertinent Vitals/Pain Pain Assessment: 0-10 Pain Score: 3  Pain Location:  R hip Pain Descriptors / Indicators: Grimacing Pain Intervention(s): Limited activity within patient's tolerance;Monitored during session;Premedicated before session;Repositioned;Ice applied    Home Living                      Prior  Function            PT Goals (current goals can now be found in the care plan section) Acute Rehab PT Goals Patient Stated Goal: to go home tomorrow  PT Goal Formulation: With patient Time For Goal Achievement: 09/09/19 Potential to Achieve Goals: Good Progress towards PT goals: Progressing toward goals    Frequency    BID      PT Plan Current plan remains appropriate    Co-evaluation              AM-PAC PT "6 Clicks" Mobility   Outcome Measure  Help needed turning from your back to your side while in a flat bed without using bedrails?: A Little Help needed moving from lying on your back to sitting on the side of a flat bed without using bedrails?: A Little Help needed moving to and from a bed to a chair (including a wheelchair)?: A Little Help needed standing up from a chair using your arms (e.g., wheelchair or bedside chair)?: A Little Help needed to walk in hospital room?: A Little Help needed climbing 3-5 steps with a railing? : A Little 6 Click Score: 18    End of Session Equipment Utilized During Treatment: Gait belt Activity Tolerance: Patient tolerated treatment well Patient left: with call bell/phone within reach;in bed;with bed alarm set;with SCD's reapplied Nurse Communication: Mobility status PT Visit Diagnosis: Other abnormalities of gait and mobility (R26.89);Pain Pain - Right/Left: Right Pain - part of body: Hip     Time: 1400-1440 PT Time Calculation (min) (ACUTE ONLY): 40 min  Charges:  $Gait Training: 8-22 mins $Therapeutic Exercise: 23-37 mins                     2:59 PM, 08/27/19 Etta Grandchild, PT, DPT Physical Therapist - [facility]  [phone] ([place])    [place] C 08/27/2019, 2:56 PM

## 2019-08-27 NOTE — Progress Notes (Signed)
  Subjective: 2 Days Post-Op Procedure(s) (LRB): TOTAL HIP ARTHROPLASTY (Right) Patient reports pain as well-controlled.   Patient is well, and has had no acute complaints or problems Plan is to go Home after hospital stay. Negative for chest pain and shortness of breath Fever: no Gastrointestinal: negative for nausea and vomiting.  Patient has not had a bowel movement.  Objective: Vital signs in last 24 hours: Temp:  [97.4 F (36.3 C)-98.7 F (37.1 C)] 98.1 F (36.7 C) (07/09 0726) Pulse Rate:  [68-83] 68 (07/09 0726) Resp:  [16-18] 17 (07/09 0726) BP: (111-147)/(53-73) 132/62 (07/09 0726) SpO2:  [96 %-99 %] 96 % (07/09 0726)  Intake/Output from previous day:  Intake/Output Summary (Last 24 hours) at 08/27/2019 0933 Last data filed at 08/27/2019 7847 Gross per 24 hour  Intake 740 ml  Output 520 ml  Net 220 ml    Intake/Output this shift: No intake/output data recorded.  Labs: No results for input(s): HGB in the last 72 hours. No results for input(s): WBC, RBC, HCT, PLT in the last 72 hours. No results for input(s): NA, K, CL, CO2, BUN, CREATININE, GLUCOSE, CALCIUM in the last 72 hours. No results for input(s): LABPT, INR in the last 72 hours.   EXAM General - Patient is Alert, Appropriate and Oriented Extremity - Neurovascular intact Dorsiflexion/Plantar flexion intact Compartment soft Dressing/Incision -clean, dry, no drainage, Hemovac in place.  Motor Function - intact, moving foot and toes well on exam.  Cardiovascular- Regular rate and rhythm, no murmurs/rubs/gallops Respiratory- Lungs clear to auscultation bilaterally Gastrointestinal- soft, nontender and active bowel sounds   Assessment/Plan: 2 Days Post-Op Procedure(s) (LRB): TOTAL HIP ARTHROPLASTY (Right) Active Problems:   H/O total hip arthroplasty  Estimated body mass index is 20.7 kg/m as calculated from the following:   Height as of this encounter: 5\' 3"  (1.6 m).   Weight as of this encounter: 53  kg. Advance diet Up with therapy Discharge home with home health pending completion of therapy goals and BM.  Hemovac removed.   DVT Prophylaxis - Lovenox, Ted hose and foot pumps Weight-Bearing as tolerated to right leg  Cassell Smiles, PA-C [facility] Orthopaedic Surgery 08/27/2019, 9:33 AM

## 2019-08-27 NOTE — Discharge Summary (Addendum)
Physician Discharge Summary  Patient ID: Gina Middleton MRN: [id number] DOB/AGE: [birth year] 84 y.o.  Admit date: 08/25/2019 Discharge date: 08/28/2019  Admission Diagnoses:  H/O total hip arthroplasty [Z96.649]  Surgeries:Procedure(s): Right total hip arthroplasty  SURGEON:  Marciano Sequin. M.D.  ASSISTANT: Cassell Smiles, PA-C (present and scrubbed throughout the case, critical for assistance with exposure, retraction, instrumentation, and closure)  ANESTHESIA: spinal  ESTIMATED BLOOD LOSS: 150 mL  FLUIDS REPLACED: 1600 mL of crystalloid  DRAINS: 2 medium drains to a Hemovac reservoir  IMPLANTS UTILIZED: DePuy 13.5 mm small stature AML femoral stem, 52 mm OD Pinnacle 100 acetabular component, neutral Pinnacle Altrx polyethylene insert, and a 36 mm M-SPEC -2 mm hip ball  Discharge Diagnoses: Patient Active Problem List   Diagnosis Date Noted  . H/O total hip arthroplasty 08/25/2019  . Primary osteoarthritis of right hip 06/27/2019  . Arthritis of hip 04/27/2018  . Renal stone 04/09/2018  . Kidney stone 04/09/2018  . Achilles tendonitis 05/12/2015  . Allergic rhinitis 09/20/2014  . Benign paroxysmal positional nystagmus 09/20/2014  . Blood glucose elevated 09/20/2014  . Neuralgia neuritis, sciatic nerve 09/20/2014  . Vegetarian 09/20/2014  . B12 deficiency 09/20/2014  . Avitaminosis D 09/20/2014  . Other specified health status 09/20/2014  . Hypertension 09/07/2014    Past Medical History:  Diagnosis Date  . Arthritis   . Complication of anesthesia   . History of kidney stones   . Hypertension   . Pneumonia 03/2017  . PONV (postoperative nausea and vomiting) 1980's   n/v with lap choley     Transfusion:    Consultants (if any):   Discharged Condition: Improved  Hospital Course: Gina Middleton is an 84 y.o. female who was admitted 08/25/2019 with a diagnosis of right hip osteoarthritis and went to the operating room on 08/25/2019 and underwent  right total hip arthroplasty. The patient received perioperative antibiotics for prophylaxis (see below). The patient tolerated the procedure well and was transported to PACU in stable condition. After meeting PACU criteria, the patient was subsequently transferred to the Orthopaedics/Rehabilitation unit.   The patient received DVT prophylaxis in the form of early mobilization, Lovenox, Foot Pumps and TED hose. A sacral pad had been placed and heels were elevated off of the bed with rolled towels in order to protect skin integrity. Foley catheter was discontinued on postoperative day #0. Wound drains were discontinued on postoperative day #2. The surgical incision was healing well without signs of infection.  Physical therapy was initiated postoperatively for transfers, gait training, and strengthening. Occupational therapy was initiated for activities of daily living and evaluation for assisted devices. Rehabilitation goals were reviewed in detail with the patient. The patient made steady progress with physical therapy and physical therapy recommended discharge to Home.   The patient achieved the preliminary goals of this hospitalization and was felt to be medically and orthopaedically appropriate for discharge.  She was given perioperative antibiotics:  Anti-infectives (From admission, onward)   Start     Dose/Rate Route Frequency Ordered Stop   08/25/19 1600  ceFAZolin (ANCEF) IVPB 2g/100 mL premix        2 g 200 mL/hr over 30 Minutes Intravenous Every 6 hours 08/25/19 1406 08/26/19 1010   08/25/19 0717  ceFAZolin (ANCEF) 2-4 GM/100ML-% IVPB       Note to Pharmacy: Myles Lipps   : cabinet override      08/25/19 0717 08/25/19 0849   08/25/19 0700  ceFAZolin (ANCEF) IVPB 2g/100 mL premix  2 g 200 mL/hr over 30 Minutes Intravenous On call to O.R. 08/25/19 2703 08/25/19 0830    .  Recent vital signs:  Vitals:   08/27/19 1941 08/28/19 0852  BP: (!) 162/74 (!) 168/90  Pulse: 83 74    Resp: 18 16  Temp: 98.5 F (36.9 C) 97.9 F (36.6 C)  SpO2: 98% 98%    Recent laboratory studies:  No results for input(s): WBC, HGB, HCT, PLT, K, CL, CO2, BUN, CREATININE, GLUCOSE, CALCIUM, LABPT, INR in the last 72 hours.  Diagnostic Studies: DG Hip Port Unilat With Pelvis 1V Right  Result Date: 08/25/2019 CLINICAL DATA:  Postop day 0 RIGHT total hip arthroplasty. EXAM: HIP (WITH OR WITHOUT PELVIS) 1V PORT RIGHT COMPARISON:  Bone window images from CT abdomen and pelvis 04/15/2018. Pelvis x-ray 04/15/2014. FINDINGS: Anatomic alignment post RIGHT total hip arthroplasty. No acute complicating features. Surgical drain in place. Severe osteoarthritis involving the contralateral LEFT hip is again noted. IMPRESSION: Anatomic alignment post RIGHT total hip arthroplasty without acute complicating features. Electronically Signed   By: Evangeline Dakin M.D.   On: 08/25/2019 14:18    Discharge Medications:   Allergies as of 08/28/2019      Reactions   Other Anaphylaxis   A muscle relaxer in the 70s but cannot remember name of the medication that caused it   Hydrocodone-acetaminophen Other (See Comments)   dizziness   Lisinopril    Hair thinning   Oxycodone-acetaminophen Nausea And Vomiting   Iodinated Diagnostic Agents Rash      Medication List    STOP taking these medications   aspirin 81 MG tablet   Mobic 7.5 MG tablet Generic drug: meloxicam     TAKE these medications   amLODipine 5 MG tablet Commonly known as: NORVASC TAKE 1 TABLET(5 MG) BY MOUTH DAILY What changed: See the new instructions.   celecoxib 200 MG capsule Commonly known as: CELEBREX Take 1 capsule (200 mg total) by mouth 2 (two) times daily.   enoxaparin 40 MG/0.4ML injection Commonly known as: LOVENOX Inject 0.4 mLs (40 mg total) into the skin daily for 14 days.   oxyCODONE 5 MG immediate release tablet Commonly known as: Oxy IR/ROXICODONE Take 1 tablet (5 mg total) by mouth every 4 (four) hours as needed  for moderate pain (pain score 4-6).   traMADol 50 MG tablet Commonly known as: ULTRAM Take 1 tablet (50 mg total) by mouth every 4 (four) hours as needed for moderate pain.            Durable Medical Equipment  (From admission, onward)         Start     Ordered   08/25/19 1407  DME Walker rolling  Once       Question:  Patient needs a walker to treat with the following condition  Answer:  S/P total hip arthroplasty   08/25/19 1406   08/25/19 1407  DME Bedside commode  Once       Question:  Patient needs a bedside commode to treat with the following condition  Answer:  S/P total hip arthroplasty   08/25/19 1406          Disposition: Discharge disposition: 01-Home or Self Care     home with home health PT      Follow-up Information    Dereck Leep, MD On 10/07/2019.   Specialty: Orthopedic Surgery Why: at 2:00pm Contact information: [place] [address] [phone]  [name], PA-C On 09/02/2019.   Specialty: Family Medicine Why: @ 11:00 am Contact information: [place] [id number] [place], PA-C 08/28/2019, 8:58 AM

## 2019-08-27 NOTE — Progress Notes (Signed)
Patient A/ox4. Up to the chair and to the [facility]. Tolerated both well. Patient refusing ice pack. Wearing foot pumps.

## 2019-08-28 NOTE — Progress Notes (Signed)
DISCHARGE NOTE:  Pt given discharge instructions, pt verbalized understanding. TED hose on both legs, 2 honeycomb dressing sent with pt. Per pt, husband took [facility] home yesterday.   Pt wheeled to car by staff. Husband transporting pt home.

## 2019-08-28 NOTE — Progress Notes (Signed)
  Subjective: 3 Days Post-Op Procedure(s) (LRB): TOTAL HIP ARTHROPLASTY (Right) Patient reports pain as mild.   Patient is well, and has had no acute complaints or problems Plan is to go Home after hospital stay. Negative for chest pain and shortness of breath Fever: no Gastrointestinal: negative for nausea and vomiting.   Patient has had a bowel movement.  Objective: Vital signs in last 24 hours: Temp:  [98.5 F (36.9 C)-98.8 F (37.1 C)] 98.5 F (36.9 C) (07/09 1941) Pulse Rate:  [78-83] 83 (07/09 1941) Resp:  [17-18] 18 (07/09 1941) BP: (150-162)/(74-75) 162/74 (07/09 1941) SpO2:  [98 %] 98 % (07/09 1941)  Intake/Output from previous day:  Intake/Output Summary (Last 24 hours) at 08/28/2019 0839 Last data filed at 08/28/2019 0500 Gross per 24 hour  Intake 0 ml  Output 0 ml  Net 0 ml    Intake/Output this shift: No intake/output data recorded.  Labs: No results for input(s): HGB in the last 72 hours. No results for input(s): WBC, RBC, HCT, PLT in the last 72 hours. No results for input(s): NA, K, CL, CO2, BUN, CREATININE, GLUCOSE, CALCIUM in the last 72 hours. No results for input(s): LABPT, INR in the last 72 hours.   EXAM General - Patient is Alert, Appropriate and Oriented Extremity - Neurovascular intact Dorsiflexion/Plantar flexion intact Compartment soft Dressing/Incision -clean, dry, minimal drainage, mini pressure dressing in place  Motor Function - intact, moving foot and toes well on exam.  Cardiovascular- Regular rate and rhythm, no murmurs/rubs/gallops Respiratory- Lungs clear to auscultation bilaterally Gastrointestinal- soft, nontender and active bowel sounds   Assessment/Plan: 3 Days Post-Op Procedure(s) (LRB): TOTAL HIP ARTHROPLASTY (Right) Active Problems:   H/O total hip arthroplasty  Estimated body mass index is 20.7 kg/m as calculated from the following:   Height as of this encounter: 5\' 3"  (1.6 m).   Weight as of this encounter: 53  kg. Advance diet Up with therapy Discharge home with home health pending completion of lap around nursing station.     DVT Prophylaxis - Lovenox, Ted hose and foot pumps Weight-Bearing as tolerated to right leg  Cassell Smiles, PA-C [facility] Orthopaedic Surgery 08/28/2019, 8:39 AM

## 2019-08-28 NOTE — Progress Notes (Signed)
Pt in bed resting quietly, able to voice needs to staff. Denise any pain at this time. Ted hose on bil lower legs with foot pumps on for prevention of DVT. Iv clean and flush well  Left lower arm. Requires asist x1 with transfer out of bed . Right hip has honey comb dressing in place clean and dry. Uses a walker for weight barring as tolerated to right side.

## 2019-08-30 ENCOUNTER — Telehealth: Payer: Self-pay

## 2019-08-30 NOTE — Telephone Encounter (Signed)
This nurse attempted to contact patient for a TCM call. Message left

## 2019-08-31 NOTE — Progress Notes (Deleted)
     Established patient visit   Patient: Gina Middleton   DOB: 1935/04/14   84 y.o. Female  MRN: [id number] Visit Date: 09/02/2019  Today's healthcare provider: [name], PA-C   No chief complaint on file.  Subjective    HPI  Follow up Hospitalization  Patient was admitted to [facility] on 08/25/19 and discharged on 08/28/19. She was treated for Total Hip Arthroplasty Treatment for this included received perioperative antibiotics for prophylaxis,received DVT prophylaxis in the form of early mobilization, Lovenox, Foot Pumps and TED hose. Telephone follow up was done on *** She reports {excellent/good/fair:19992} compliance with treatment. She reports this condition is {resolved/improved/worsened:23923}.  ----------------------------------------------------------------------------------------- -   {Show patient history (optional):23778::" "}   Medications: Outpatient Medications Prior to Visit  Medication Sig  . amLODipine (NORVASC) 5 MG tablet TAKE 1 TABLET(5 MG) BY MOUTH DAILY (Patient taking differently: Take 5 mg by mouth at bedtime. )  . celecoxib (CELEBREX) 200 MG capsule Take 1 capsule (200 mg total) by mouth 2 (two) times daily.  Marland Kitchen enoxaparin (LOVENOX) 40 MG/0.4ML injection Inject 0.4 mLs (40 mg total) into the skin daily for 14 days.  Marland Kitchen oxyCODONE (OXY IR/ROXICODONE) 5 MG immediate release tablet Take 1 tablet (5 mg total) by mouth every 4 (four) hours as needed for moderate pain (pain score 4-6).  Marland Kitchen traMADol (ULTRAM) 50 MG tablet Take 1 tablet (50 mg total) by mouth every 4 (four) hours as needed for moderate pain.   No facility-administered medications prior to visit.    Review of Systems  {Heme  Chem  Endocrine  Serology  Results Review (optional):23779::" "}  Objective    There were no vitals taken for this visit. {Show previous vital signs (optional):23777::" "}  Physical Exam  ***  No results found for any visits on 09/02/19.   Assessment & Plan     ***  No follow-ups on file.      {provider attestation***:1}   [name]  [facility] [phone] (phone) [phone] (fax)  [place]

## 2019-09-02 ENCOUNTER — Inpatient Hospital Stay: Payer: Medicare PPO | Admitting: Physician Assistant

## 2019-09-13 ENCOUNTER — Encounter: Payer: Self-pay | Admitting: Orthopedic Surgery

## 2019-10-11 ENCOUNTER — Ambulatory Visit: Payer: Medicare PPO | Admitting: Urology

## 2019-10-11 ENCOUNTER — Encounter: Payer: Self-pay | Admitting: Urology

## 2019-10-11 ENCOUNTER — Other Ambulatory Visit: Payer: Self-pay | Admitting: *Deleted

## 2019-10-11 ENCOUNTER — Ambulatory Visit
Admission: RE | Admit: 2019-10-11 | Discharge: 2019-10-11 | Disposition: A | Discharge status: Home / Self Care | Payer: Medicare PPO | Source: Ambulatory Visit | Attending: Urology | Admitting: Urology

## 2019-10-11 ENCOUNTER — Ambulatory Visit: Admission: RE | Admit: 2019-10-11 | Payer: Medicare PPO | Source: Home / Self Care | Admitting: Urology

## 2019-10-11 ENCOUNTER — Other Ambulatory Visit: Payer: Self-pay

## 2019-10-11 VITALS — BP 162/73 | HR 71 | Ht 63.0 in | Wt 119.0 lb

## 2019-10-11 DIAGNOSIS — N200 Calculus of kidney: Secondary | ICD-10-CM

## 2019-10-11 NOTE — Progress Notes (Signed)
10/11/2019 3:03 PM   Gina Middleton 07-10-1935 [id number]  Referring provider: [name], PA-C [address],  [place] [address]  Chief Complaint  Patient presents with   Follow-up    Urologic history: 1.  Nephrolithiasis -CT 03/2018 w/ 2-2 mm nonobstructing right renal calculi -Status post ureteroscopic removal 3 mm left distal calculus   HPI: 84 y.o. female presents for annual follow-up.   Overall done well since last visit had episode of mild dysuria 2 weeks ago which resolved  Denies flank, abdominal or pelvic pain  Denies gross hematuria  PMH: Past Medical History:  Diagnosis Date   Actinic keratosis 10/08/2012   Left mid to distal tricep    Arthritis    Basal cell carcinoma 07/21/2018   Left inferior antihelix   Complication of anesthesia    History of kidney stones    Hypertension    Pneumonia 03/2017   PONV (postoperative nausea and vomiting) 1980's   n/v with lap choley    Surgical History: Past Surgical History:  Procedure Laterality Date   APPENDECTOMY     BACK SURGERY  1969   CHOLECYSTECTOMY     CYSTOSCOPY W/ RETROGRADES Left 04/09/2018   Procedure: CYSTOSCOPY WITH RETROGRADE PYELOGRAM;  Surgeon: Abbie Sons, MD;  Location: [facility] ORS;  Service: Urology;  Laterality: Left;   CYSTOSCOPY WITH STENT PLACEMENT Left 04/09/2018   Procedure: CYSTOSCOPY WITH STENT PLACEMENT-LEFT;  Surgeon: Abbie Sons, MD;  Location: [facility] OR[facility];  Service: Urology;  Laterality: Left;   CYSTOSCOPY WITH URETEROSCOPY Left 04/09/2018   Procedure: POSSIBLE URETEROSCOPY-LEFT;  Surgeon: Abbie Sons, MD;  Location: [facility] OR[facility];  Service: Urology;  Laterality: Left;   TOTAL HIP ARTHROPLASTY Right 08/25/2019   Procedure: TOTAL HIP ARTHROPLASTY;  Surgeon: Dereck Leep, MD;  Location: [facility] ORS;  Service: Orthopedics;  Laterality: Right;    Home Medications:  Allergies as of 10/11/2019      Reactions   Other Anaphylaxis     A muscle relaxer in the 70s but cannot remember name of the medication that caused it   Hydrocodone-acetaminophen Other (See Comments)   dizziness   Lisinopril    Hair thinning   Oxycodone-acetaminophen Nausea And Vomiting   Iodinated Diagnostic Agents Rash      Medication List       Accurate as of October 11, 2019  3:03 PM. If you have any questions, ask your nurse or doctor.        amLODipine 5 MG tablet Commonly known as: NORVASC TAKE 1 TABLET(5 MG) BY MOUTH DAILY What changed: See the new instructions.   celecoxib 200 MG capsule Commonly known as: CELEBREX Take 1 capsule (200 mg total) by mouth 2 (two) times daily.   enoxaparin 40 MG/0.4ML injection Commonly known as: LOVENOX Inject 0.4 mLs (40 mg total) into the skin daily for 14 days.   meloxicam 7.5 MG tablet Commonly known as: MOBIC   oxyCODONE 5 MG immediate release tablet Commonly known as: Oxy IR/ROXICODONE Take 1 tablet (5 mg total) by mouth every 4 (four) hours as needed for moderate pain (pain score 4-6).   traMADol 50 MG tablet Commonly known as: ULTRAM Take 1 tablet (50 mg total) by mouth every 4 (four) hours as needed for moderate pain.       Allergies:  Allergies  Allergen Reactions   Other Anaphylaxis    A muscle relaxer in the 70s but cannot remember name of the medication that caused it  Hydrocodone-Acetaminophen Other (See Comments)    dizziness   Lisinopril     Hair thinning   Oxycodone-Acetaminophen Nausea And Vomiting   Iodinated Diagnostic Agents Rash    Family History: Family History  Problem Relation Age of Onset   Hypertension Mother    Alzheimer's disease Mother    Heart attack Father        x's 3   Diabetes Sister    Heart attack Sister    Breast cancer Maternal Grandmother     Social History:  reports that she has never smoked. She has never used smokeless tobacco. She reports previous alcohol use. She reports that she does not use drugs.   Physical  Exam: BP (!) 162/73 (BP Location: Left Arm, Patient Position: Sitting, Cuff Size: Normal)    Pulse 71    Ht 5\' 3"  (1.6 m)    Wt 119 lb (54 kg)    BMI 21.08 kg/m   Constitutional:  Alert and oriented, No acute distress. HEENT: [place] AT, moist mucus membranes.  Trachea midline, no masses. Cardiovascular: No clubbing, cyanosis, or edema. Respiratory: Normal respiratory effort, no increased work of breathing. Skin: No rashes, bruises or suspicious lesions. Neurologic: Grossly intact, no focal deficits, moving all 4 extremities. Psychiatric: Normal mood and affect.   Pertinent Imaging: KUB performed today reviewed and no calcifications suspicious for urinary tract stones identified   Assessment & Plan:    1.  Right nephrolithiasis  Asymptomatic, not seen on KUB  Follow-up 1 year with RUS  Return earlier for development of right flank pain   Abbie Sons, MD  [facility] [address], [place], [place] [address] [phone]

## 2019-10-14 ENCOUNTER — Ambulatory Visit: Payer: Medicare Other | Admitting: Dermatology

## 2019-10-14 ENCOUNTER — Other Ambulatory Visit: Payer: Self-pay

## 2019-10-14 DIAGNOSIS — L820 Inflamed seborrheic keratosis: Secondary | ICD-10-CM

## 2019-10-14 DIAGNOSIS — Z1283 Encounter for screening for malignant neoplasm of skin: Secondary | ICD-10-CM | POA: Diagnosis not present

## 2019-10-14 DIAGNOSIS — L219 Seborrheic dermatitis, unspecified: Secondary | ICD-10-CM | POA: Diagnosis not present

## 2019-10-14 DIAGNOSIS — S80861A Insect bite (nonvenomous), right lower leg, initial encounter: Secondary | ICD-10-CM

## 2019-10-14 DIAGNOSIS — L578 Other skin changes due to chronic exposure to nonionizing radiation: Secondary | ICD-10-CM

## 2019-10-14 DIAGNOSIS — D1800 Hemangioma unspecified site: Secondary | ICD-10-CM

## 2019-10-14 DIAGNOSIS — L570 Actinic keratosis: Secondary | ICD-10-CM

## 2019-10-14 DIAGNOSIS — Z85828 Personal history of other malignant neoplasm of skin: Secondary | ICD-10-CM

## 2019-10-14 DIAGNOSIS — L814 Other melanin hyperpigmentation: Secondary | ICD-10-CM

## 2019-10-14 DIAGNOSIS — W57XXXA Bitten or stung by nonvenomous insect and other nonvenomous arthropods, initial encounter: Secondary | ICD-10-CM

## 2019-10-14 DIAGNOSIS — D229 Melanocytic nevi, unspecified: Secondary | ICD-10-CM

## 2019-10-14 DIAGNOSIS — L821 Other seborrheic keratosis: Secondary | ICD-10-CM

## 2019-10-14 NOTE — Progress Notes (Signed)
Follow-Up Visit   Subjective  Gina Middleton is a 84 y.o. female who presents for the following: Annual Exam (1 year f/u TBSE, Hx of BCC ).  Pt c/o bite on her R leg x 2 weeks improving. The patient presents for Total-Body Skin Exam (TBSE) for skin cancer screening and mole check.  The following portions of the chart were reviewed this encounter and updated as appropriate:  Tobacco  Allergies  Meds  Problems  Med Hx  Surg Hx  Fam Hx     Review of Systems:  No other skin or systemic complaints except as noted in HPI or Assessment and Plan.  Objective  Well appearing patient in no apparent distress; mood and affect are within normal limits.  A full examination was performed including scalp, head, eyes, ears, nose, lips, neck, chest, axillae, abdomen, back, buttocks, bilateral upper extremities, bilateral lower extremities, hands, feet, fingers, toes, fingernails, and toenails. All findings within normal limits unless otherwise noted below.  Objective  Head - Anterior (Face) (2): Erythematous thin papules/macules with gritty scale.   Objective  Chest, sternum (2): Erythematous keratotic or waxy stuck-on papule or plaque.   Objective  scalp: Pink patches with greasy scale.   Objective  Left inferior antihelix: Well healed scar with no evidence of recurrence.   Objective  Right Lower Leg - Anterior: Bite reaction    Assessment & Plan    Lentigines - Scattered tan macules - Discussed due to sun exposure - Benign, observe - Call for any changes  Seborrheic Keratoses - Stuck-on, waxy, tan-brown papules and plaques  - Discussed benign etiology and prognosis. - Observe - Call for any changes  Melanocytic Nevi - Tan-brown and/or pink-flesh-colored symmetric macules and papules - Benign appearing on exam today - Observation - Call clinic for new or changing moles - Recommend daily use of broad spectrum spf 30+ sunscreen to sun-exposed areas.    Hemangiomas - Red papules - Discussed benign nature - Observe - Call for any changes  Actinic Damage - diffuse scaly erythematous macules with underlying dyspigmentation - Recommend daily broad spectrum sunscreen SPF 30+ to sun-exposed areas, reapply every 2 hours as needed.  - Call for new or changing lesions.  Skin cancer screening performed today.  AK (actinic keratosis) (2) Head - Anterior (Face)  Destruction of lesion - Head - Anterior (Face) Complexity: simple   Destruction method: cryotherapy   Informed consent: discussed and consent obtained   Timeout:  patient name, date of birth, surgical site, and procedure verified Lesion destroyed using liquid nitrogen: Yes   Region frozen until ice ball extended beyond lesion: Yes   Outcome: patient tolerated procedure well with no complications   Post-procedure details: wound care instructions given    Inflamed seborrheic keratosis (2) Chest, sternum  Destruction of lesion - Chest, sternum Complexity: simple   Destruction method: cryotherapy   Informed consent: discussed and consent obtained   Timeout:  patient name, date of birth, surgical site, and procedure verified Lesion destroyed using liquid nitrogen: Yes   Region frozen until ice ball extended beyond lesion: Yes   Outcome: patient tolerated procedure well with no complications   Post-procedure details: wound care instructions given    Seborrheic dermatitis scalp  Start otc head and shoulders shampoo at least 2-3 times a week   History of basal cell carcinoma (BCC) Left inferior antihelix  Clear. Observe for recurrence. Call clinic for new or changing lesions.  Recommend regular skin exams, daily broad-spectrum spf 30+  sunscreen use, and photoprotection.      Insect bite of right lower leg with local reaction, initial encounter Right Lower Leg - Anterior  Bite reaction improving  No treatment need  Return in about 1 year (around 10/13/2020) for TBSE, Hx  of BCC .  IMarye Round, CMA, am acting as scribe for Sarina Ser, MD .  Documentation: I have reviewed the above documentation for accuracy and completeness, and I agree with the above.  Sarina Ser, MD

## 2019-10-22 ENCOUNTER — Encounter: Payer: Self-pay | Admitting: Dermatology

## 2019-11-08 ENCOUNTER — Other Ambulatory Visit: Payer: Self-pay

## 2019-11-08 ENCOUNTER — Encounter: Payer: Self-pay | Admitting: Physician Assistant

## 2019-11-08 ENCOUNTER — Ambulatory Visit: Payer: Medicare PPO | Admitting: Physician Assistant

## 2019-11-08 VITALS — BP 162/79 | HR 76 | Temp 98.9°F | Resp 16 | Wt 121.6 lb

## 2019-11-08 DIAGNOSIS — M7989 Other specified soft tissue disorders: Secondary | ICD-10-CM | POA: Diagnosis not present

## 2019-11-08 DIAGNOSIS — I10 Essential (primary) hypertension: Secondary | ICD-10-CM

## 2019-11-08 MED ORDER — FUROSEMIDE 20 MG PO TABS: 20.0000 mg | ORAL_TABLET | Freq: Every day | ORAL | 0 refills | Status: DC

## 2019-11-08 NOTE — Progress Notes (Signed)
Established patient visit   Patient: Gina Middleton   DOB: 1935-09-23   84 y.o. Female  MRN: [id number] Visit Date: 11/08/2019  Today's healthcare provider: [name], PA-C   Chief Complaint  Patient presents with  . Leg Swelling   Subjective    HPI  Edema: Patient complains of edema. The location of the edema is lower leg(s) and feet bilateral.  The edema has been moderate.  Onset of symptoms was 1 week ago, unchanged since that time. The edema is present all day. The patient states the patient has had 0 such episodes.  The swelling has been aggravated by nothing, relieved by nothing. She had hip surgery July 7 and she didn't have any problem and state she never had or notice any swelling. Patient reports that elevating her legs and overnight the swelling does not improve. Both legs are affected but the right leg is slightly larger than the left.    Medications: Outpatient Medications Prior to Visit  Medication Sig  . amLODipine (NORVASC) 5 MG tablet TAKE 1 TABLET(5 MG) BY MOUTH DAILY (Patient taking differently: Take 5 mg by mouth at bedtime. )  . meloxicam (MOBIC) 7.5 MG tablet   . celecoxib (CELEBREX) 200 MG capsule Take 1 capsule (200 mg total) by mouth 2 (two) times daily. (Patient not taking: Reported on 11/08/2019)  . enoxaparin (LOVENOX) 40 MG/0.4ML injection Inject 0.4 mLs (40 mg total) into the skin daily for 14 days.  Marland Kitchen oxyCODONE (OXY IR/ROXICODONE) 5 MG immediate release tablet Take 1 tablet (5 mg total) by mouth every 4 (four) hours as needed for moderate pain (pain score 4-6). (Patient not taking: Reported on 11/08/2019)  . traMADol (ULTRAM) 50 MG tablet Take 1 tablet (50 mg total) by mouth every 4 (four) hours as needed for moderate pain. (Patient not taking: Reported on 11/08/2019)   No facility-administered medications prior to visit.    Review of Systems  Constitutional: Negative.   Respiratory: Negative.   Cardiovascular: Positive for leg  swelling. Negative for chest pain and palpitations.  Musculoskeletal: Positive for arthralgias (left hip).  Neurological: Negative.        Objective    BP (!) 162/79 (BP Location: Left Arm, Patient Position: Sitting, Cuff Size: Normal)   Pulse 76   Temp 98.9 F (37.2 C) (Oral)   Resp 16   Wt 121 lb 9.6 oz (55.2 kg)   BMI 21.54 kg/m     Physical Exam Vitals reviewed.  Constitutional:      General: She is not in acute distress.    Appearance: Normal appearance. She is well-developed. She is not ill-appearing or diaphoretic.  Cardiovascular:     Rate and Rhythm: Normal rate and regular rhythm.     Pulses: Normal pulses.     Heart sounds: Normal heart sounds. No murmur heard.  No friction rub. No gallop.   Pulmonary:     Effort: Pulmonary effort is normal. No respiratory distress.     Breath sounds: Normal breath sounds. No wheezing or rales.  Musculoskeletal:     Cervical back: Normal range of motion and neck supple.     Right lower leg: Edema (2+ pititng) present.     Left lower leg: Edema (2+ pitting) present.  Skin:    Capillary Refill: Capillary refill takes less than 2 seconds.  Neurological:     General: No focal deficit present.     Mental Status: She is alert. Mental status is at baseline.  Psychiatric:        Mood and Affect: Mood normal.        Thought Content: Thought content normal.      No results found for any visits on 11/08/19.  Assessment & Plan     1. Leg swelling New over last week. Did have right hip replacement in July. No swelling since surgery until now. Both legs affected. Will check labs as below and f/u pending results. Start Furosemide 20mg  as below. F/U in 7-10 days.  - CBC w/Diff/Platelet - Basic Metabolic Panel (BMET) - B Nat Peptide - furosemide (LASIX) 20 MG tablet; Take 1 tablet (20 mg total) by mouth daily.  Dispense: 10 tablet; Refill: 0  2. Essential hypertension Elevated today. May stop and change amlodipine if still elevated  and legs still swelling at f/u.    No follow-ups on file.      [name], PA-C, have reviewed all documentation for this visit. The documentation on 11/11/19 for the exam, diagnosis, procedures, and orders are all accurate and complete.   [name]  [facility] [phone] (phone) [phone] (fax)  [place]

## 2019-11-08 NOTE — Patient Instructions (Signed)

## 2019-11-10 ENCOUNTER — Telehealth: Payer: Self-pay

## 2019-11-10 LAB — CBC WITH DIFFERENTIAL/PLATELET
Basophils Absolute: 0 10*3/uL (ref 0.0–0.2)
Basos: 1 %
EOS (ABSOLUTE): 0.1 10*3/uL (ref 0.0–0.4)
Eos: 3 %
Hematocrit: 33.8 % — ABNORMAL LOW (ref 34.0–46.6)
Hemoglobin: 11.7 g/dL (ref 11.1–15.9)
Immature Grans (Abs): 0 10*3/uL (ref 0.0–0.1)
Immature Granulocytes: 0 %
Lymphocytes Absolute: 1.2 10*3/uL (ref 0.7–3.1)
Lymphs: 24 %
MCH: 30.8 pg (ref 26.6–33.0)
MCHC: 34.6 g/dL (ref 31.5–35.7)
MCV: 89 fL (ref 79–97)
Monocytes Absolute: 0.6 10*3/uL (ref 0.1–0.9)
Monocytes: 12 %
Neutrophils Absolute: 3 10*3/uL (ref 1.4–7.0)
Neutrophils: 60 %
Platelets: 233 10*3/uL (ref 150–450)
RBC: 3.8 x10E6/uL (ref 3.77–5.28)
RDW: 12.2 % (ref 11.7–15.4)
WBC: 5 10*3/uL (ref 3.4–10.8)

## 2019-11-10 LAB — BASIC METABOLIC PANEL
BUN/Creatinine Ratio: 15 (ref 12–28)
BUN: 12 mg/dL (ref 8–27)
CO2: 25 mmol/L (ref 20–29)
Calcium: 9.4 mg/dL (ref 8.7–10.3)
Chloride: 107 mmol/L — ABNORMAL HIGH (ref 96–106)
Creatinine, Ser: 0.81 mg/dL (ref 0.57–1.00)
GFR calc Af Amer: 78 mL/min/{1.73_m2} (ref >59)
GFR calc non Af Amer: 68 mL/min/{1.73_m2} (ref >59)
Glucose: 86 mg/dL (ref 65–99)
Potassium: 4.3 mmol/L (ref 3.5–5.2)
Sodium: 146 mmol/L — ABNORMAL HIGH (ref 134–144)

## 2019-11-10 LAB — BRAIN NATRIURETIC PEPTIDE: BNP: 31.1 pg/mL (ref 0.0–100.0)

## 2019-11-10 NOTE — Telephone Encounter (Signed)
LMTCB-If patient calls back ok for PEC nurse to give results. °

## 2019-11-10 NOTE — Telephone Encounter (Signed)
-----   Message from [name], [place] sent at 11/10/2019  2:57 PM EDT ----- Blood count is normal. Kidney function is normal. BNP (heart enzyme) is normal. Suspect swelling more from dependent positioning of legs. Will start the fluid pill and re-evaluate swelling at f/u visit on 11/19/19

## 2019-11-11 NOTE — Telephone Encounter (Signed)
Pt. Given results and instructions. Verbalizes understanding. Warm transfer to [facility] in the practice to discuss appointment for COVID 19 vaccination.

## 2019-11-11 NOTE — Telephone Encounter (Signed)
Copied from [place] [phone]. Topic: General - Other >> Nov 10, 2019  3:16 PM Leward Quan A wrote: Reason for CRM: Patient would like a call back to have an appointment scheduled for covid vaccine for her husband Please call Ph# [phone]

## 2019-11-11 NOTE — Telephone Encounter (Signed)
Patient and husband was scheduled for COVID vaccine.

## 2019-11-18 ENCOUNTER — Other Ambulatory Visit: Payer: Self-pay

## 2019-11-18 ENCOUNTER — Ambulatory Visit (INDEPENDENT_AMBULATORY_CARE_PROVIDER_SITE_OTHER): Payer: Medicare Other

## 2019-11-18 DIAGNOSIS — Z23 Encounter for immunization: Secondary | ICD-10-CM | POA: Diagnosis not present

## 2019-11-18 NOTE — Patient Instructions (Signed)
If you develop any shortness of breath, difficulty swallowing, chest pain, headaches, or dizziness. Please go to the ER for evaluation. May use Tylenol and heat compresses to help with pain or tenderness at injection site.

## 2019-11-18 NOTE — Progress Notes (Signed)
After administering 1st dose of Covid vaccine (pfizer), patient c/o a burning sensation in the back of her throat. She was observed in our office for 41mins. After observation, she still had burning sensation in her throat. She was offered a glass of water and Benadryl. Her BP was 164/68 p. 67 O2 99%. She was observed in our office for another 49mins.   After 2nd observation, her BP was 145/74 p59 O2 100%. She reports that she still had the burning sensation, but not as bad. It was recommended that by Dr. B that we monitor another 13mins.   After 3rd observation, her BP was 141/78 p.63 O2 100%. Patient feels like she can go home. She still has burning, but it has almost resolved. Her husband is also in the office, and will drive her home. Pt was given information about signs and symptoms of an anaphylactic reaction and to go to the ER should she experience the symptoms listed.

## 2019-11-19 ENCOUNTER — Ambulatory Visit (INDEPENDENT_AMBULATORY_CARE_PROVIDER_SITE_OTHER): Payer: Medicare PPO | Admitting: Physician Assistant

## 2019-11-19 ENCOUNTER — Other Ambulatory Visit: Payer: Self-pay

## 2019-11-19 ENCOUNTER — Encounter: Payer: Self-pay | Admitting: Physician Assistant

## 2019-11-19 VITALS — BP 149/78 | HR 80 | Temp 98.5°F | Resp 18 | Wt 122.0 lb

## 2019-11-19 DIAGNOSIS — M7989 Other specified soft tissue disorders: Secondary | ICD-10-CM | POA: Insufficient documentation

## 2019-11-19 DIAGNOSIS — I10 Essential (primary) hypertension: Secondary | ICD-10-CM | POA: Diagnosis not present

## 2019-11-19 MED ORDER — LOSARTAN POTASSIUM-HCTZ 50-12.5 MG PO TABS: 1.0000 | ORAL_TABLET | Freq: Every day | ORAL | 1 refills | Status: DC

## 2019-11-19 NOTE — Patient Instructions (Signed)
Stop Amlodipine Start Losart-HCTZ 50-12.5mg 

## 2019-11-19 NOTE — Progress Notes (Signed)
I,Gina Middleton,acting as a scribe for [employer], PA-C.,have documented all relevant documentation on the behalf of Gina Daring, PA-C,as directed by  Gina Daring, PA-C while in the presence of Gina Daring, PA-C.   Established patient visit   Patient: Gina Middleton   DOB: 06-18-1935   84 y.o. Female  MRN: [id number] Visit Date: 11/19/2019  Today's healthcare provider: [name], PA-C   Chief Complaint  Patient presents with  . Edema   Subjective    HPI  Patient is here concerning swelling in bilateral legs and feet for 2 weeks. Patient states swelling has been consistent with no change even after rest and elevation. No pain in the legs or feet, other than chronic pain from hips. Both legs are swelling with R slightly more than the left. No redness. Has been less mobile since her right hip surgery in 08/25/2019.   Patient Active Problem List   Diagnosis Date Noted  . Leg swelling 11/19/2019  . H/O total hip arthroplasty 08/25/2019  . Primary osteoarthritis of right hip 06/27/2019  . Arthritis of hip 04/27/2018  . Renal stone 04/09/2018  . Nephrolithiasis 04/09/2018  . Achilles tendonitis 05/12/2015  . Allergic rhinitis 09/20/2014  . Benign paroxysmal positional nystagmus 09/20/2014  . Blood glucose elevated 09/20/2014  . Neuralgia neuritis, sciatic nerve 09/20/2014  . Vegetarian 09/20/2014  . B12 deficiency 09/20/2014  . Avitaminosis D 09/20/2014  . Other specified health status 09/20/2014  . Hypertension 09/07/2014   Past Medical History:  Diagnosis Date  . Actinic keratosis 10/08/2012   Left mid to distal tricep   . Arthritis   . Basal cell carcinoma 07/21/2018   Left inferior antihelix  . Complication of anesthesia   . History of kidney stones   . Hypertension   . Pneumonia 03/2017  . PONV (postoperative nausea and vomiting) 1980's   n/v with lap choley       Medications: Outpatient Medications Prior to Visit   Medication Sig  . amLODipine (NORVASC) 5 MG tablet TAKE 1 TABLET(5 MG) BY MOUTH DAILY (Patient taking differently: Take 5 mg by mouth at bedtime. )  . furosemide (LASIX) 20 MG tablet Take 1 tablet (20 mg total) by mouth daily.  . meloxicam (MOBIC) 7.5 MG tablet   . celecoxib (CELEBREX) 200 MG capsule Take 1 capsule (200 mg total) by mouth 2 (two) times daily. (Patient not taking: Reported on 11/08/2019)   No facility-administered medications prior to visit.    Review of Systems  Constitutional: Negative for appetite change, chills, fatigue and fever.  Respiratory: Negative for chest tightness and shortness of breath.   Cardiovascular: Positive for leg swelling. Negative for chest pain and palpitations.  Gastrointestinal: Negative for abdominal pain, nausea and vomiting.  Musculoskeletal: Positive for arthralgias.  Neurological: Negative for dizziness and weakness.    Last CBC Lab Results  Component Value Date   WBC 5.0 11/09/2019   HGB 11.7 11/09/2019   HCT 33.8 (L) 11/09/2019   MCV 89 11/09/2019   MCH 30.8 11/09/2019   RDW 12.2 11/09/2019   PLT 233 35/32/9924   Last metabolic panel Lab Results  Component Value Date   GLUCOSE 86 11/09/2019   NA 146 (H) 11/09/2019   K 4.3 11/09/2019   CL 107 (H) 11/09/2019   CO2 25 11/09/2019   BUN 12 11/09/2019   CREATININE 0.81 11/09/2019   GFRNONAA 68 11/09/2019   GFRAA 78 11/09/2019   CALCIUM 9.4 11/09/2019   PROT  7.5 08/17/2019   ALBUMIN 4.3 08/17/2019   LABGLOB 2.7 03/15/2019   AGRATIO 1.6 03/15/2019   BILITOT 1.2 08/17/2019   ALKPHOS 100 08/17/2019   AST 22 08/17/2019   ALT 13 08/17/2019   ANIONGAP 10 08/17/2019      Objective    BP (!) 149/78 (BP Location: Right Arm, Patient Position: Sitting, Cuff Size: Normal)   Pulse 80   Temp 98.5 F (36.9 C) (Oral)   Resp 18   Wt 122 lb (55.3 kg)   SpO2 98%   BMI 21.61 kg/m  BP Readings from Last 3 Encounters:  11/19/19 (!) 149/78  11/08/19 (!) 162/79  10/11/19 (!)  162/73   Wt Readings from Last 3 Encounters:  11/19/19 122 lb (55.3 kg)  11/08/19 121 lb 9.6 oz (55.2 kg)  10/11/19 119 lb (54 kg)      Physical Exam Vitals reviewed.  Constitutional:      General: She is not in acute distress.    Appearance: Normal appearance. She is well-developed. She is not ill-appearing or diaphoretic.  Cardiovascular:     Rate and Rhythm: Normal rate and regular rhythm.     Pulses: Normal pulses.     Heart sounds: Normal heart sounds. No murmur heard.  No friction rub. No gallop.   Pulmonary:     Effort: Pulmonary effort is normal. No respiratory distress.     Breath sounds: Normal breath sounds. No wheezing or rales.  Musculoskeletal:     Cervical back: Normal range of motion and neck supple.     Right lower leg: Edema (2+ pitting) present.     Left lower leg: Edema (1-2+ pitting) present.  Skin:    General: Skin is warm and dry.     Findings: No erythema.  Neurological:     Mental Status: She is alert.       No results found for any visits on 11/19/19.  Assessment & Plan     1. Leg swelling Will stop Amlodipine 5mg . Has used furosemide without much change. Will start Losartan-HCTZ 50-12.5mg . F/U in 2-4 weeks for leg swelling and BP.   2. Primary hypertension See above medical treatment plan. - losartan-hydrochlorothiazide (HYZAAR) 50-12.5 MG tablet; Take 1 tablet by mouth daily.  Dispense: 30 tablet; Refill: 1   No follow-ups on file.      Gina Bowl, PA-C, have reviewed all documentation for this visit. The documentation on 11/25/19 for the exam, diagnosis, procedures, and orders are all accurate and complete.   Gina Middleton  [facility] [phone] (phone) [phone] (fax)  [place]

## 2019-12-03 ENCOUNTER — Encounter: Payer: Self-pay | Admitting: Physician Assistant

## 2019-12-03 ENCOUNTER — Other Ambulatory Visit: Payer: Self-pay

## 2019-12-03 ENCOUNTER — Ambulatory Visit: Payer: Medicare PPO | Admitting: Physician Assistant

## 2019-12-03 VITALS — BP 149/84 | HR 66 | Temp 98.8°F | Resp 16 | Wt 120.2 lb

## 2019-12-03 DIAGNOSIS — L84 Corns and callosities: Secondary | ICD-10-CM

## 2019-12-03 DIAGNOSIS — M7989 Other specified soft tissue disorders: Secondary | ICD-10-CM

## 2019-12-03 DIAGNOSIS — G5601 Carpal tunnel syndrome, right upper limb: Secondary | ICD-10-CM

## 2019-12-03 DIAGNOSIS — I10 Essential (primary) hypertension: Secondary | ICD-10-CM | POA: Diagnosis not present

## 2019-12-03 MED ORDER — LOSARTAN POTASSIUM-HCTZ 50-12.5 MG PO TABS: 1.0000 | ORAL_TABLET | Freq: Every day | ORAL | 1 refills | Status: DC

## 2019-12-03 NOTE — Progress Notes (Signed)
Established patient visit   Patient: Gina Middleton   DOB: 11-02-1935   84 y.o. Female  MRN: [id number] Visit Date: 12/03/2019  Today's healthcare provider: [name], PA-C   Chief Complaint  Patient presents with  . Follow-up   Subjective    HPI  Follow up for Edema and BP  The patient was last seen for this 2 weeks ago. Changes made at last visit include stop Amlodipine 5mg . Has used furosemide without much change. Will start Losartan-HCTZ 50-12.5mg . F/U in 2-4 weeks for leg swelling and BP.   She reports excellent compliance with treatment. She feels that condition is Improved. She is not having side effects.   BP Readings from Last 3 Encounters:  12/03/19 (!) 149/84  11/19/19 (!) 149/78  11/08/19 (!) 162/79   -----------------------------------------------------------------------------------------   Patient Active Problem List   Diagnosis Date Noted  . Leg swelling 11/19/2019  . H/O total hip arthroplasty 08/25/2019  . Primary osteoarthritis of right hip 06/27/2019  . Arthritis of hip 04/27/2018  . Renal stone 04/09/2018  . Nephrolithiasis 04/09/2018  . Achilles tendonitis 05/12/2015  . Allergic rhinitis 09/20/2014  . Benign paroxysmal positional nystagmus 09/20/2014  . Blood glucose elevated 09/20/2014  . Neuralgia neuritis, sciatic nerve 09/20/2014  . Vegetarian 09/20/2014  . B12 deficiency 09/20/2014  . Avitaminosis D 09/20/2014  . Other specified health status 09/20/2014  . Hypertension 09/07/2014   Past Medical History:  Diagnosis Date  . Actinic keratosis 10/08/2012   Left mid to distal tricep   . Arthritis   . Basal cell carcinoma 07/21/2018   Left inferior antihelix  . Complication of anesthesia   . History of kidney stones   . Hypertension   . Pneumonia 03/2017  . PONV (postoperative nausea and vomiting) 1980's   n/v with lap choley       Medications: Outpatient Medications Prior to Visit  Medication Sig  .  celecoxib (CELEBREX) 200 MG capsule Take 1 capsule (200 mg total) by mouth 2 (two) times daily.  . meloxicam (MOBIC) 7.5 MG tablet   . [DISCONTINUED] losartan-hydrochlorothiazide (HYZAAR) 50-12.5 MG tablet Take 1 tablet by mouth daily.   No facility-administered medications prior to visit.    Review of Systems  Constitutional: Negative.   Respiratory: Negative.   Cardiovascular: Negative.  Leg swelling: improved.  Neurological: Negative.        Objective    BP (!) 149/84 (BP Location: Left Arm, Patient Position: Sitting, Cuff Size: Normal)   Pulse 66   Temp 98.8 F (37.1 C) (Oral)   Resp 16   Wt 120 lb 3.2 oz (54.5 kg)   BMI 21.29 kg/m  BP Readings from Last 3 Encounters:  12/03/19 (!) 149/84  11/19/19 (!) 149/78  11/08/19 (!) 162/79   Wt Readings from Last 3 Encounters:  12/03/19 120 lb 3.2 oz (54.5 kg)  11/19/19 122 lb (55.3 kg)  11/08/19 121 lb 9.6 oz (55.2 kg)      Physical Exam Vitals reviewed.  Constitutional:      General: She is not in acute distress.    Appearance: Normal appearance. She is well-developed. She is not diaphoretic.  Cardiovascular:     Rate and Rhythm: Normal rate and regular rhythm.     Pulses: Normal pulses.     Heart sounds: Normal heart sounds. No murmur heard.  No friction rub. No gallop.   Pulmonary:     Effort: Pulmonary effort is normal. No respiratory distress.  Breath sounds: Normal breath sounds. No wheezing or rales.  Musculoskeletal:     Cervical back: Normal range of motion and neck supple.     Right lower leg: Edema (trace) present.     Left lower leg: No edema.  Neurological:     Mental Status: She is alert.       Results for orders placed or performed in visit on 67/61/95  Basic Metabolic Panel (BMET)  Result Value Ref Range   Glucose 90 65 - 99 mg/dL   BUN 20 8 - 27 mg/dL   Creatinine, Ser 0.97 0.57 - 1.00 mg/dL   GFR calc non Af Amer 55 (L) >59 mL/min/1.73   GFR calc Af Amer 63 >59 mL/min/1.73    BUN/Creatinine Ratio 21 12 - 28   Sodium 140 134 - 144 mmol/L   Potassium 4.4 3.5 - 5.2 mmol/L   Chloride 103 96 - 106 mmol/L   CO2 24 20 - 29 mmol/L   Calcium 9.1 8.7 - 10.3 mg/dL    Assessment & Plan     1. Primary hypertension Leg swelling improved with stopping amlodipine. Losartan-HCTZ controlling BP well. Will check labs as below and f/u pending results. - Basic Metabolic Panel (BMET) - losartan-hydrochlorothiazide (HYZAAR) 50-12.5 MG tablet; Take 1 tablet by mouth daily.  Dispense: 90 tablet; Refill: 1  2. Leg swelling Improved.   3. Corn of toe Noted on medial side 2nd left toe. Referral to podiatry placed.  - Ambulatory referral to Podiatry  4. Carpal tunnel syndrome of right wrist Noted. Patient already seeing Dr. Marry Guan and planning surgery for November.    No follow-ups on file.      [name], PA-C, have reviewed all documentation for this visit. The documentation on 12/07/19 for the exam, diagnosis, procedures, and orders are all accurate and complete.   [name]  [facility] [phone] (phone) [phone] (fax)  [place]

## 2019-12-04 LAB — BASIC METABOLIC PANEL
BUN/Creatinine Ratio: 21 (ref 12–28)
BUN: 20 mg/dL (ref 8–27)
CO2: 24 mmol/L (ref 20–29)
Calcium: 9.1 mg/dL (ref 8.7–10.3)
Chloride: 103 mmol/L (ref 96–106)
Creatinine, Ser: 0.97 mg/dL (ref 0.57–1.00)
GFR calc Af Amer: 63 mL/min/{1.73_m2} (ref >59)
GFR calc non Af Amer: 55 mL/min/{1.73_m2} — ABNORMAL LOW (ref >59)
Glucose: 90 mg/dL (ref 65–99)
Potassium: 4.4 mmol/L (ref 3.5–5.2)
Sodium: 140 mmol/L (ref 134–144)

## 2019-12-07 ENCOUNTER — Encounter: Payer: Self-pay | Admitting: Physician Assistant

## 2019-12-09 ENCOUNTER — Other Ambulatory Visit: Payer: Self-pay

## 2019-12-09 ENCOUNTER — Ambulatory Visit (INDEPENDENT_AMBULATORY_CARE_PROVIDER_SITE_OTHER): Payer: Medicare Other

## 2019-12-09 DIAGNOSIS — Z23 Encounter for immunization: Secondary | ICD-10-CM | POA: Diagnosis not present

## 2019-12-12 NOTE — H&P (Signed)
ORTHOPAEDIC HISTORY & PHYSICAL Progress Notes Gina Middleton, Florinda Marker., MD - 11/25/2019 11:00 AM EDT Chief Complaint: Chief Complaint  Patient presents with  . Post Operative Visit  3 months right total hip arthroplasty 08/25/19  . Carpal Tunnel  Right hand carpal tunnel syndrome   Reason for Visit: The patient is a 84 y.o. right-hand dominant female who presents today for evaluation of her right hand and wrist. She has a long history of numbness and paresthesias to the right hand. She has difficulty buttoning buttons due to the numbness. She has not appreciated any improvement despite use of splints an meloxicam.  The patient is also 3 months status post right total hip arthroplasty. She denies any significant right hip, groin, or thigh pain. She does use a cane in the right hand primarily due to left hip pain.  Medications: Current Outpatient Medications  Medication Sig Dispense Refill  . aspirin 81 MG EC tablet Take 81 mg by mouth once daily  . FUROsemide (LASIX) 20 MG tablet Take 1 tablet by mouth once daily as needed  . losartan-hydrochlorothiazide (HYZAAR) 50-12.5 mg tablet Take 1 tablet by mouth once daily  . meloxicam (MOBIC) 7.5 MG tablet Take 1 tablet (7.5 mg total) by mouth 2 (two) times daily 60 tablet 1   No current facility-administered medications for this visit.   Allergies: Allergies  Allergen Reactions  . Iodinated Contrast Media Anaphylaxis  . Hydrocodone-Acetaminophen Dizziness  . Oxycodone-Acetaminophen Vomiting   Past Medical History: Past Medical History:  Diagnosis Date  . Hypertension  . Osteoarthritis   Past Surgical History: Past Surgical History:  Procedure Laterality Date  . APPENDECTOMY  . BACK SURGERY  . CHOLECYSTECTOMY  . Right total hip arthroplasty 08/25/2019  Dr Marry Guan   Social History: Social History   Socioeconomic History  . Marital status: Married  Spouse name: Kyung Rudd  . Number of children: 0  . Years of education: 33  .  Highest education level: Master's degree (e.g., MA, MS, MEng, MEd, MSW, MBA)  Occupational History  . Occupation: Part-time- Scientist, research (life sciences)  Tobacco Use  . Smoking status: Never Smoker  . Smokeless tobacco: Never Used  Substance and Sexual Activity  . Alcohol use: No  . Drug use: No  . Sexual activity: Defer  Other Topics Concern  . Not on file  Social History Narrative  . Not on file   Social Determinants of Health   Financial Resource Strain:  . Difficulty of Paying Living Expenses:  Food Insecurity:  . Worried About Charity fundraiser in the Last Year:  . Arboriculturist in the Last Year:  Transportation Needs:  . Film/video editor (Medical):  Marland Kitchen Lack of Transportation (Non-Medical):  Physical Activity:  . Days of Exercise per Week:  . Minutes of Exercise per Session:  Stress:  . Feeling of Stress :  Social Connections:  . Frequency of Communication with Friends and Family:  . Frequency of Social Gatherings with Friends and Family:  . Attends Religious Services:  . Active Member of Clubs or Organizations:  . Attends Archivist Meetings:  Marland Kitchen Marital Status:   Family History: Family History  Problem Relation Age of Onset  . Osteoarthritis Mother  . Heart disease Father  . Diabetes type II Sister   Review of Systems: A comprehensive 14 point ROS was performed, reviewed, and the pertinent orthopaedic findings are documented in the HPI.  Exam BP 148/72  Temp 36.2 C (97.2 F)  Ht 160 cm (  5\' 3" )  Wt 54.6 kg (120 lb 6.4 oz)  BMI 21.33 kg/m   General:  Well-developed, well-nourished female seen in no acute distress.   HEENT:  Atraumatic, normocephalic. Pupils are equal and reactive to light. Extraocular motion is intact. Sclera are clear. Oropharynx is clear with moist mucosa.  Neck: Good range of motion. No tenderness to palpation. Spurling`s test is negative.  Lungs:  Clear to auscultation bilaterally.  Cardiovascular:  Regular rate  and rhythm. Normal S1, S2. No murmur . No appreciable gallops or rubs. Peripheral pulses are palpable.   Extremities:  Normal shoulder contour.  Good range of motion and stability of the shoulders, elbows, and wrists. Tinel`s test at the elbow is negative.  Right hand:  Tenderness: Negative Erythema: negative Swelling: negative Capillary Refill: normal Thenar atrophy: negative Intrinsic wasting: negative Grip strength: fair to good grip strength Pincer strength: fair to good pincer strength Tinel`s test: positive Phalen`s test: positive Triggering: No gross triggering or locking of the digits Finkelstein`s test: negative Range of motion: Good range of motion of the digits  Right Hip: Pelvic tilt: Negative Limb lengths: Equal with the patient standing Soft tissue swelling: Negative Erythema: Negative Crepitance: Negative Tenderness: Greater trochanter is nontender to palpation. No pain is elicited by axial compression or extremes of rotation. Atrophy: No atrophy. Good hip flexor and abductor strength. Range of Motion: Good active and passive range of motion of the right hip  Neurologic:  Awake, alert , and oriented.  Sensory function is intact except for decreased discrimination to pinprick and light touch in a median nerve distribution. Motor strength is judged to be 5/5 except as noted above. No clonus or tremor.  Motor coordination is within normal limits.  Impression: Right carpal tunnel syndrome Right total hip arthroplasty   Plan:  The findings were discussed in detail with the patient. The patient was given informational material on carpal tunnel release. Conservative treatment options were reviewed with the patient. We discussed the risks and benefits of surgical intervention. The usual perioperative course was also discussed in detail. The patient expressed understanding of the risks and benefits of surgical intervention and would like to proceed with plans for  right carpal tunnel release.  MEDICAL CLEARANCE: Per anesthesiology ACTIVITIES: As tolerated. WORK STATUS: Not applicable. THERAPY: None MEDICATIONS: Requested Prescriptions   No prescriptions requested or ordered in this encounter   FOLLOW-UP: Return for preop History & Physical pending surgery date.  Kasiya Burck P. Holley Bouche., M.D.   Electronically signed by Lamar Benes., MD at 11/28/2019 5:39 PM EDT

## 2019-12-15 ENCOUNTER — Other Ambulatory Visit: Payer: Self-pay

## 2019-12-15 ENCOUNTER — Encounter
Admission: RE | Admit: 2019-12-15 | Discharge: 2019-12-15 | Disposition: A | Discharge status: Home / Self Care | Payer: Medicare PPO | Source: Ambulatory Visit | Attending: Orthopedic Surgery | Admitting: Orthopedic Surgery

## 2019-12-15 NOTE — Patient Instructions (Signed)
Your procedure is scheduled on:12-22-19 [facility] The Report to Day Surgery on the 2nd floor of the [place]. To find out your arrival time, please call [phone] between 1PM - 3PM on: 12-21-19 TUESDAY  REMEMBER: Instructions that are not followed completely may result in serious medical risk, up to and including death; or upon the discretion of your surgeon and anesthesiologist your surgery may need to be rescheduled.  Do not eat food after midnight the night before surgery.  No gum chewing, lozengers or hard candies.  You may however, drink CLEAR liquids up to 2 hours before you are scheduled to arrive for your surgery. Do not drink anything within 2 hours of your scheduled arrival time.  Clear liquids include: - water  - apple juice without pulp - gatorade (not RED, PURPLE, OR BLUE) - black coffee or tea (Do NOT add milk or creamers to the coffee or tea) Do NOT drink anything that is not on this list.  In addition, your doctor has ordered for you to drink the provided  Ensure Pre-Surgery Clear Carbohydrate Drink  Drinking this carbohydrate drink up to two hours before surgery helps to reduce insulin resistance and improve patient outcomes. Please complete drinking 2 hours prior to scheduled arrival time.  TAKE THESE MEDICATIONS THE MORNING OF SURGERY WITH A SIP OF WATER: -NONE   Follow recommendations from Cardiologist, Pulmonologist or PCP regarding stopping Aspirin, Coumadin, Plavix, Eliquis, Pradaxa, or Pletal-STOP YOUR ASPIRIN NOW  One week prior to surgery: Stop Anti-inflammatories (NSAIDS) such as MOBIC (MELOXICAM), Advil, Aleve, Ibuprofen, Motrin, Naproxen, Naprosyn and Aspirin based products such as Excedrin, Goodys Powder, BC Powder-OK TO TAKE TYLENOL IF NEEDED  Stop ANY OVER THE COUNTER supplements until after surgery.  No Alcohol for 24 hours before or after surgery.  No Smoking including e-cigarettes for 24 hours prior to surgery.  No chewable tobacco products  for at least 6 hours prior to surgery.  No nicotine patches on the day of surgery.  Do not use any "recreational" drugs for at least a week prior to your surgery.  Please be advised that the combination of cocaine and anesthesia may have negative outcomes, up to and including death. If you test positive for cocaine, your surgery will be cancelled.  On the morning of surgery brush your teeth with toothpaste and water, you may rinse your mouth with mouthwash if you wish. Do not swallow any toothpaste or mouthwash.  Do not wear jewelry, make-up, hairpins, clips or nail polish.  Do not wear lotions, powders, or perfumes.   Do not shave 48 hours prior to surgery.   Contact lenses, hearing aids and dentures may not be worn into surgery.  Do not bring valuables to the hospital. [facility] is not responsible for any missing/lost belongings or valuables.   Use CHG Soap as directed on instruction sheet..   Notify your doctor if there is any change in your medical condition (cold, fever, infection).  Wear comfortable clothing (specific to your surgery type) to the hospital.  Plan for stool softeners for home use; pain medications have a tendency to cause constipation. You can also help prevent constipation by eating foods high in fiber such as fruits and vegetables and drinking plenty of fluids as your diet allows.  After surgery, you can help prevent lung complications by doing breathing exercises.  Take deep breaths and cough every 1-2 hours. Your doctor may order a device called an Incentive Spirometer to help you take deep breaths. When coughing or  sneezing, hold a pillow firmly against your incision with both hands. This is called "splinting." Doing this helps protect your incision. It also decreases belly discomfort.  If you are being admitted to the hospital overnight, leave your suitcase in the car. After surgery it may be brought to your room.  If you are being discharged the day  of surgery, you will not be allowed to drive home. You will need a responsible adult (18 years or older) to drive you home and stay with you that night.   If you are taking public transportation, you will need to have a responsible adult (18 years or older) with you. Please confirm with your physician that it is acceptable to use public transportation.   Please call the [facility] [place] [facility] at [phone] if you have any questions about these instructions.  Visitation Policy:  Patients undergoing a surgery or procedure may have one family member or support person with them as long as that person is not COVID-19 positive or experiencing its symptoms.  That person may remain in the waiting area during the procedure.  Inpatient Visitation Update:   In an effort to ensure the safety of our team members and our patients, we are implementing a change to our visitation policy:  Effective Monday, Aug. 9, at 7 a.m., inpatients will be allowed one support person.  o The support person may change daily.  o The support person must pass our screening, gel in and out, and wear a mask at all times, including in the patient's room.  o Patients must also wear a mask when staff or their support person are in the room.  o Masking is required regardless of vaccination status.  Systemwide, no visitors 17 or younger.

## 2019-12-20 ENCOUNTER — Other Ambulatory Visit: Payer: Self-pay

## 2019-12-20 ENCOUNTER — Other Ambulatory Visit
Admission: RE | Admit: 2019-12-20 | Discharge: 2019-12-20 | Disposition: A | Discharge status: Home / Self Care | Payer: Medicare PPO | Source: Ambulatory Visit | Attending: Orthopedic Surgery | Admitting: Orthopedic Surgery

## 2019-12-20 DIAGNOSIS — Z20822 Contact with and (suspected) exposure to covid-19: Secondary | ICD-10-CM | POA: Insufficient documentation

## 2019-12-20 DIAGNOSIS — Z01812 Encounter for preprocedural laboratory examination: Secondary | ICD-10-CM | POA: Diagnosis not present

## 2019-12-20 LAB — SARS CORONAVIRUS 2 (TAT 6-24 HRS): SARS Coronavirus 2: NEGATIVE

## 2019-12-21 ENCOUNTER — Encounter: Payer: Self-pay | Admitting: Orthopedic Surgery

## 2019-12-21 MED ORDER — ORAL CARE MOUTH RINSE: 15.0000 mL | Freq: Once | OROMUCOSAL | Status: AC

## 2019-12-21 MED ORDER — FAMOTIDINE 20 MG PO TABS: 20.0000 mg | ORAL_TABLET | Freq: Once | ORAL | Status: AC

## 2019-12-21 MED ORDER — CHLORHEXIDINE GLUCONATE 0.12 % MT SOLN: 15.0000 mL | Freq: Once | OROMUCOSAL | Status: AC

## 2019-12-21 MED ORDER — CEFAZOLIN SODIUM-DEXTROSE 2-4 GM/100ML-% IV SOLN
2.0000 g | INTRAVENOUS | Status: AC
  Administered 2019-12-22: 2 g via INTRAVENOUS

## 2019-12-21 MED ORDER — LACTATED RINGERS IV SOLN: INTRAVENOUS | Status: DC

## 2019-12-21 NOTE — H&P (Signed)
ORTHOPAEDIC HISTORY & PHYSICAL Progress Notes Gina Middleton, Florinda Marker., MD - 11/25/2019 11:00 AM EDT Formatting of this note is different from the original. Images from the original note were not included. Chief Complaint: Chief Complaint  Patient presents with  . Post Operative Visit  3 months right total hip arthroplasty 08/25/19  . Carpal Tunnel  Right hand carpal tunnel syndrome   Reason for Visit: The patient is a 84 y.o. right-hand dominant female who presents today for evaluation of her right hand and wrist. She has a long history of numbness and paresthesias to the right hand. She has difficulty buttoning buttons due to the numbness. She has not appreciated any improvement despite use of splints an meloxicam.  The patient is also 3 months status post right total hip arthroplasty. She denies any significant right hip, groin, or thigh pain. She does use a cane in the right hand primarily due to left hip pain.  Medications: Current Outpatient Medications  Medication Sig Dispense Refill  . aspirin 81 MG EC tablet Take 81 mg by mouth once daily  . FUROsemide (LASIX) 20 MG tablet Take 1 tablet by mouth once daily as needed  . losartan-hydrochlorothiazide (HYZAAR) 50-12.5 mg tablet Take 1 tablet by mouth once daily  . meloxicam (MOBIC) 7.5 MG tablet Take 1 tablet (7.5 mg total) by mouth 2 (two) times daily 60 tablet 1   No current facility-administered medications for this visit.   Allergies: Allergies  Allergen Reactions  . Iodinated Contrast Media Anaphylaxis  . Hydrocodone-Acetaminophen Dizziness  . Oxycodone-Acetaminophen Vomiting   Past Medical History: Past Medical History:  Diagnosis Date  . Hypertension  . Osteoarthritis   Past Surgical History: Past Surgical History:  Procedure Laterality Date  . APPENDECTOMY  . BACK SURGERY  . CHOLECYSTECTOMY  . Right total hip arthroplasty 08/25/2019  Dr Marry Guan   Social History: Social History   Socioeconomic History   . Marital status: Married  Spouse name: Kyung Rudd  . Number of children: 0  . Years of education: 102  . Highest education level: Master's degree (e.g., MA, MS, MEng, MEd, MSW, MBA)  Occupational History  . Occupation: Part-time- Scientist, research (life sciences)  Tobacco Use  . Smoking status: Never Smoker  . Smokeless tobacco: Never Used  Substance and Sexual Activity  . Alcohol use: No  . Drug use: No  . Sexual activity: Defer  Other Topics Concern  . Not on file  Social History Narrative  . Not on file   Social Determinants of Health   Financial Resource Strain:  . Difficulty of Paying Living Expenses:  Food Insecurity:  . Worried About Charity fundraiser in the Last Year:  . Arboriculturist in the Last Year:  Transportation Needs:  . Film/video editor (Medical):  Marland Kitchen Lack of Transportation (Non-Medical):  Physical Activity:  . Days of Exercise per Week:  . Minutes of Exercise per Session:  Stress:  . Feeling of Stress :  Social Connections:  . Frequency of Communication with Friends and Family:  . Frequency of Social Gatherings with Friends and Family:  . Attends Religious Services:  . Active Member of Clubs or Organizations:  . Attends Archivist Meetings:  Marland Kitchen Marital Status:   Family History: Family History  Problem Relation Age of Onset  . Osteoarthritis Mother  . Heart disease Father  . Diabetes type II Sister   Review of Systems: A comprehensive 14 point ROS was performed, reviewed, and the pertinent orthopaedic findings are documented  in the HPI.  Exam BP 148/72  Temp 36.2 C (97.2 F)  Ht 160 cm (5\' 3" )  Wt 54.6 kg (120 lb 6.4 oz)  BMI 21.33 kg/m   General:  Well-developed, well-nourished female seen in no acute distress.   HEENT:  Atraumatic, normocephalic. Pupils are equal and reactive to light. Extraocular motion is intact. Sclera are clear. Oropharynx is clear with moist mucosa.  Neck: Good range of motion. No tenderness to  palpation. Spurling`s test is negative.  Lungs:  Clear to auscultation bilaterally.  Cardiovascular:  Regular rate and rhythm. Normal S1, S2. No murmur . No appreciable gallops or rubs. Peripheral pulses are palpable.   Extremities:  Normal shoulder contour.  Good range of motion and stability of the shoulders, elbows, and wrists. Tinel`s test at the elbow is negative.  Right hand:  Tenderness: Negative Erythema: negative Swelling: negative Capillary Refill: normal Thenar atrophy: negative Intrinsic wasting: negative Grip strength: fair to good grip strength Pincer strength: fair to good pincer strength Tinel`s test: positive Phalen`s test: positive Triggering: No gross triggering or locking of the digits Finkelstein`s test: negative Range of motion: Good range of motion of the digits  Right Hip: Pelvic tilt: Negative Limb lengths: Equal with the patient standing Soft tissue swelling: Negative Erythema: Negative Crepitance: Negative Tenderness: Greater trochanter is nontender to palpation. No pain is elicited by axial compression or extremes of rotation. Atrophy: No atrophy. Good hip flexor and abductor strength. Range of Motion: Good active and passive range of motion of the right hip  Neurologic:  Awake, alert , and oriented.  Sensory function is intact except for decreased discrimination to pinprick and light touch in a median nerve distribution. Motor strength is judged to be 5/5 except as noted above. No clonus or tremor.  Motor coordination is within normal limits.  Impression: Right carpal tunnel syndrome Right total hip arthroplasty   Plan:  The findings were discussed in detail with the patient. The patient was given informational material on carpal tunnel release. Conservative treatment options were reviewed with the patient. We discussed the risks and benefits of surgical intervention. The usual perioperative course was also discussed in detail. The  patient expressed understanding of the risks and benefits of surgical intervention and would like to proceed with plans for right carpal tunnel release.  MEDICAL CLEARANCE: Per anesthesiology ACTIVITIES: As tolerated. WORK STATUS: Not applicable. THERAPY: None MEDICATIONS: Requested Prescriptions   No prescriptions requested or ordered in this encounter   FOLLOW-UP: Return for preop History & Physical pending surgery date.  Paddy Walthall P. Holley Bouche., M.D.   Electronically signed by Lamar Benes., MD at 11/28/2019 5:39 PM EDT

## 2019-12-22 ENCOUNTER — Ambulatory Visit
Admission: RE | Admit: 2019-12-22 | Discharge: 2019-12-22 | Disposition: A | Discharge status: Home / Self Care | Payer: Medicare PPO | Attending: Orthopedic Surgery | Admitting: Orthopedic Surgery

## 2019-12-22 ENCOUNTER — Encounter: Payer: Self-pay | Admitting: Orthopedic Surgery

## 2019-12-22 ENCOUNTER — Encounter
Admission: RE | Disposition: A | Discharge status: Home / Self Care | Payer: Self-pay | Source: Home / Self Care | Attending: Orthopedic Surgery

## 2019-12-22 ENCOUNTER — Ambulatory Visit: Payer: Medicare PPO | Admitting: Anesthesiology

## 2019-12-22 DIAGNOSIS — Z9889 Other specified postprocedural states: Secondary | ICD-10-CM

## 2019-12-22 DIAGNOSIS — Z8261 Family history of arthritis: Secondary | ICD-10-CM | POA: Insufficient documentation

## 2019-12-22 DIAGNOSIS — Z7982 Long term (current) use of aspirin: Secondary | ICD-10-CM | POA: Diagnosis not present

## 2019-12-22 DIAGNOSIS — Z833 Family history of diabetes mellitus: Secondary | ICD-10-CM | POA: Insufficient documentation

## 2019-12-22 DIAGNOSIS — G5601 Carpal tunnel syndrome, right upper limb: Secondary | ICD-10-CM | POA: Insufficient documentation

## 2019-12-22 DIAGNOSIS — Z91041 Radiographic dye allergy status: Secondary | ICD-10-CM | POA: Insufficient documentation

## 2019-12-22 DIAGNOSIS — Z885 Allergy status to narcotic agent status: Secondary | ICD-10-CM | POA: Insufficient documentation

## 2019-12-22 DIAGNOSIS — I10 Essential (primary) hypertension: Secondary | ICD-10-CM | POA: Diagnosis not present

## 2019-12-22 DIAGNOSIS — Z79899 Other long term (current) drug therapy: Secondary | ICD-10-CM | POA: Diagnosis not present

## 2019-12-22 DIAGNOSIS — Z96641 Presence of right artificial hip joint: Secondary | ICD-10-CM | POA: Insufficient documentation

## 2019-12-22 DIAGNOSIS — Z9049 Acquired absence of other specified parts of digestive tract: Secondary | ICD-10-CM | POA: Diagnosis not present

## 2019-12-22 DIAGNOSIS — Z8249 Family history of ischemic heart disease and other diseases of the circulatory system: Secondary | ICD-10-CM | POA: Insufficient documentation

## 2019-12-22 HISTORY — PX: CARPAL TUNNEL RELEASE: SHX101

## 2019-12-22 SURGERY — CARPAL TUNNEL RELEASE
Anesthesia: General | Site: Wrist | Laterality: Right

## 2019-12-22 MED ORDER — METOCLOPRAMIDE HCL 10 MG PO TABS: 5.0000 mg | ORAL_TABLET | Freq: Three times a day (TID) | ORAL | Status: DC | PRN

## 2019-12-22 MED ORDER — ONDANSETRON HCL 4 MG/2ML IJ SOLN: 4.0000 mg | Freq: Four times a day (QID) | INTRAMUSCULAR | Status: DC | PRN

## 2019-12-22 MED ORDER — FENTANYL CITRATE (PF) 100 MCG/2ML IJ SOLN: 25.0000 ug | INTRAMUSCULAR | Status: DC | PRN

## 2019-12-22 MED ORDER — FAMOTIDINE 20 MG PO TABS
ORAL_TABLET | ORAL | Status: AC
  Administered 2019-12-22: 20 mg via ORAL
  Filled 2019-12-22: qty 1

## 2019-12-22 MED ORDER — ONDANSETRON HCL 4 MG/2ML IJ SOLN
INTRAMUSCULAR | Status: DC | PRN
  Administered 2019-12-22: 4 mg via INTRAVENOUS

## 2019-12-22 MED ORDER — PROPOFOL 10 MG/ML IV BOLUS
INTRAVENOUS | Status: AC
  Filled 2019-12-22: qty 20

## 2019-12-22 MED ORDER — TRAMADOL HCL 50 MG PO TABS
50.0000 mg | ORAL_TABLET | Freq: Four times a day (QID) | ORAL | 0 refills | Status: DC | PRN
Start: 2019-12-22 — End: 2020-04-19

## 2019-12-22 MED ORDER — FENTANYL CITRATE (PF) 100 MCG/2ML IJ SOLN
INTRAMUSCULAR | Status: AC
  Filled 2019-12-22: qty 2

## 2019-12-22 MED ORDER — FENTANYL CITRATE (PF) 100 MCG/2ML IJ SOLN
INTRAMUSCULAR | Status: DC | PRN
  Administered 2019-12-22 (×3): 25 ug via INTRAVENOUS

## 2019-12-22 MED ORDER — BUPIVACAINE HCL (PF) 0.25 % IJ SOLN
INTRAMUSCULAR | Status: DC | PRN
  Administered 2019-12-22: 7 mL

## 2019-12-22 MED ORDER — PROPOFOL 500 MG/50ML IV EMUL
INTRAVENOUS | Status: AC
  Filled 2019-12-22: qty 50

## 2019-12-22 MED ORDER — ONDANSETRON HCL 4 MG PO TABS: 4.0000 mg | ORAL_TABLET | Freq: Four times a day (QID) | ORAL | Status: DC | PRN

## 2019-12-22 MED ORDER — DEXAMETHASONE SODIUM PHOSPHATE 10 MG/ML IJ SOLN
INTRAMUSCULAR | Status: DC | PRN
  Administered 2019-12-22: 5 mg via INTRAVENOUS

## 2019-12-22 MED ORDER — ONDANSETRON HCL 4 MG/2ML IJ SOLN: 4.0000 mg | Freq: Once | INTRAMUSCULAR | Status: DC | PRN

## 2019-12-22 MED ORDER — METOCLOPRAMIDE HCL 5 MG/ML IJ SOLN: 5.0000 mg | Freq: Three times a day (TID) | INTRAMUSCULAR | Status: DC | PRN

## 2019-12-22 MED ORDER — CHLORHEXIDINE GLUCONATE 0.12 % MT SOLN
OROMUCOSAL | Status: AC
  Administered 2019-12-22: 15 mL via OROMUCOSAL
  Filled 2019-12-22: qty 15

## 2019-12-22 MED ORDER — PROPOFOL 10 MG/ML IV BOLUS
INTRAVENOUS | Status: DC | PRN
  Administered 2019-12-22: 165 ug/kg/min via INTRAVENOUS

## 2019-12-22 MED ORDER — ACETAMINOPHEN 10 MG/ML IV SOLN
INTRAVENOUS | Status: DC | PRN
  Administered 2019-12-22: 1000 mg via INTRAVENOUS

## 2019-12-22 MED ORDER — ACETAMINOPHEN 10 MG/ML IV SOLN
INTRAVENOUS | Status: AC
  Filled 2019-12-22: qty 100

## 2019-12-22 MED ORDER — DEXMEDETOMIDINE (PRECEDEX) IN NS 20 MCG/5ML (4 MCG/ML) IV SYRINGE
PREFILLED_SYRINGE | INTRAVENOUS | Status: DC | PRN
  Administered 2019-12-22 (×2): 4 ug via INTRAVENOUS

## 2019-12-22 MED ORDER — CEFAZOLIN SODIUM-DEXTROSE 2-4 GM/100ML-% IV SOLN
INTRAVENOUS | Status: AC
  Filled 2019-12-22: qty 100

## 2019-12-22 SURGICAL SUPPLY — 28 items
BNDG CMPR STD VLCR NS LF 5.8X3 (GAUZE/BANDAGES/DRESSINGS) ×1
BNDG ELASTIC 3X5.8 VLCR NS LF (GAUZE/BANDAGES/DRESSINGS) ×3 IMPLANT
BNDG ESMARK 4X12 TAN STRL LF (GAUZE/BANDAGES/DRESSINGS) ×3 IMPLANT
CANISTER SUCT 1200ML W/VALVE (MISCELLANEOUS) ×3 IMPLANT
CAST PADDING 3X4FT ST 30246 (SOFTGOODS) ×2
COVER WAND RF STERILE (DRAPES) ×3 IMPLANT
CUFF TOURN SGL QUICK 18X4 (TOURNIQUET CUFF) ×3 IMPLANT
DRSG DERMACEA 8X12 NADH (GAUZE/BANDAGES/DRESSINGS) ×3 IMPLANT
DURAPREP 26ML APPLICATOR (WOUND CARE) ×3 IMPLANT
ELECT CAUTERY BLADE 6.4 (BLADE) ×3 IMPLANT
ELECT REM PT RETURN 9FT ADLT (ELECTROSURGICAL) ×3
ELECTRODE REM PT RTRN 9FT ADLT (ELECTROSURGICAL) ×1 IMPLANT
GAUZE SPONGE 4X4 12PLY STRL (GAUZE/BANDAGES/DRESSINGS) ×3 IMPLANT
GLOVE BIOGEL M STRL SZ7.5 (GLOVE) ×3 IMPLANT
GLOVE INDICATOR 8.0 STRL GRN (GLOVE) ×3 IMPLANT
GOWN STRL REUS W/ TWL LRG LVL3 (GOWN DISPOSABLE) ×2 IMPLANT
GOWN STRL REUS W/TWL LRG LVL3 (GOWN DISPOSABLE) ×6
KIT TURNOVER KIT A (KITS) ×3 IMPLANT
NS IRRIG 500ML POUR BTL (IV SOLUTION) ×3 IMPLANT
PACK EXTREMITY (MISCELLANEOUS) ×3 IMPLANT
PAD CAST CTTN 3X4 STRL (SOFTGOODS) ×1 IMPLANT
PADDING CAST COTTON 3X4 STRL (SOFTGOODS) ×1
SOL PREP PVP 2OZ (MISCELLANEOUS) ×3
SOLUTION PREP PVP 2OZ (MISCELLANEOUS) ×1 IMPLANT
SPLINT CAST 1 STEP 3X12 (MISCELLANEOUS) ×3 IMPLANT
STOCKINETTE 48X4 2 PLY STRL (GAUZE/BANDAGES/DRESSINGS) ×1 IMPLANT
STOCKINETTE STRL 4IN 9604848 (GAUZE/BANDAGES/DRESSINGS) ×3 IMPLANT
SUT ETHILON 5-0 FS-2 18 BLK (SUTURE) ×3 IMPLANT

## 2019-12-22 NOTE — H&P (Signed)
The patient has been re-examined, and the chart reviewed, and there have been no interval changes to the documented history and physical.    The risks, benefits, and alternatives have been discussed at length. The patient expressed understanding of the risks benefits and agreed with plans for surgical intervention.  Dyer Klug P. Talasia Saulter, Jr. M.D.    

## 2019-12-22 NOTE — Transfer of Care (Signed)
Immediate Anesthesia Transfer of Care Note  Patient: Gina Middleton  Procedure(s) Performed: CARPAL TUNNEL RELEASE (Right Wrist)  Patient Location: PACU  Anesthesia Type:General  Level of Consciousness: awake, alert  and oriented  Airway & Oxygen Therapy: Patient Spontanous Breathing and Patient connected to face mask oxygen  Post-op Assessment: Report given to RN and Post -op Vital signs reviewed and stable  Post vital signs: Reviewed and stable  Last Vitals:  Vitals Value Taken Time  BP 104/66 12/22/19 1409  Temp    Pulse 71 12/22/19 1413  Resp 14 12/22/19 1413  SpO2 100 % 12/22/19 1413  Vitals shown include unvalidated device data.  Last Pain:  Vitals:   12/22/19 1409  PainSc: (P) 0-No pain         Complications: No complications documented.

## 2019-12-22 NOTE — Anesthesia Preprocedure Evaluation (Addendum)
Anesthesia Evaluation  Patient identified by MRN, date of birth, ID band Patient awake    Reviewed: Allergy & Precautions, H&P , NPO status , Patient's Chart, lab work & pertinent test results  History of Anesthesia Complications (+) PONV  Airway Mallampati: II  TM Distance: >3 FB     Dental  (+) Chipped   Pulmonary neg pulmonary ROS, neg sleep apnea, neg COPD,    breath sounds clear to auscultation       Cardiovascular hypertension, (-) angina(-) Past MI and (-) Cardiac Stents (-) dysrhythmias  Rhythm:regular Rate:Normal     Neuro/Psych negative neurological ROS  negative psych ROS   GI/Hepatic negative GI ROS, Neg liver ROS,   Endo/Other  negative endocrine ROS  Renal/GU      Musculoskeletal   Abdominal   Peds  Hematology negative hematology ROS (+)   Anesthesia Other Findings Past Medical History: 10/08/2012: Actinic keratosis     Comment:  Left mid to distal tricep  No date: Arthritis 07/21/2018: Basal cell carcinoma     Comment:  Left inferior antihelix No date: Complication of anesthesia No date: History of kidney stones No date: Hypertension 03/2017: Pneumonia 1980's: PONV (postoperative nausea and vomiting)     Comment:  n/v with lap choley and n/v after 08-2019 hip surgery  Past Surgical History: No date: APPENDECTOMY 1969: BACK SURGERY No date: CHOLECYSTECTOMY 04/09/2018: CYSTOSCOPY W/ RETROGRADES; Left     Comment:  Procedure: CYSTOSCOPY WITH RETROGRADE PYELOGRAM;                Surgeon: Abbie Sons, MD;  Location: [facility];                Service: Urology;  Laterality: Left; 04/09/2018: CYSTOSCOPY WITH STENT PLACEMENT; Left     Comment:  Procedure: CYSTOSCOPY WITH STENT PLACEMENT-LEFT;                Surgeon: Abbie Sons, MD;  Location: [facility];                Service: Urology;  Laterality: Left; 04/09/2018: CYSTOSCOPY WITH URETEROSCOPY; Left     Comment:  Procedure: POSSIBLE  URETEROSCOPY-LEFT;  Surgeon:               Abbie Sons, MD;  Location: [facility];  Service:               Urology;  Laterality: Left; 08/25/2019: TOTAL HIP ARTHROPLASTY; Right     Comment:  Procedure: TOTAL HIP ARTHROPLASTY;  Surgeon: Dereck Leep, MD;  Location: [facility];  Service: Orthopedics;               Laterality: Right;     Reproductive/Obstetrics negative OB ROS                            Anesthesia Physical Anesthesia Plan  ASA: II  Anesthesia Plan: General LMA   Post-op Pain Management:    Induction:   PONV Risk Score and Plan: Dexamethasone, Ondansetron, Treatment may vary due to age or medical condition and Propofol infusion  Airway Management Planned:   Additional Equipment:   Intra-op Plan:   Post-operative Plan:   Informed Consent: I have reviewed the patients History and Physical, chart, labs and discussed the procedure including the risks, benefits and alternatives for the proposed anesthesia with the patient or authorized  representative who has indicated his/her understanding and acceptance.     Dental Advisory Given  Plan Discussed with: Anesthesiologist, CRNA and Surgeon  Anesthesia Plan Comments:        Anesthesia Quick Evaluation

## 2019-12-22 NOTE — Discharge Instructions (Signed)
Instructions after Hand / Wrist Surgery   James P. Hooten, Jr., M.D.  Dept. of Orthopaedics & Sports Medicine  [facility]  [address]  [place], [place]  [address]   Phone: [phone]   Fax: [phone]   DIET: . Drink plenty of non-alcoholic fluids & begin a light diet. . Resume your normal diet the day after surgery.  ACTIVITY:  . Keep the hand elevated above the level of the elbow. . Begin gently moving the fingers on a regular basis to avoid stiffness. . Avoid any heavy lifting, pushing, or pulling with the operative hand. . Do not drive or operate any equipment until instructed.  WOUND CARE:  . Keep the splint/bandage clean and dry.  . The splint and stitches will be removed in the office. . Continue to use the ice packs periodically to reduce pain and swelling. . You may bathe or shower after the stitches are removed at the first office visit following surgery.  MEDICATIONS: . You may resume your regular medications. . Please take the pain medication as prescribed. . Do not take pain medication on an empty stomach. . Do not drive or drink alcoholic beverages when taking pain medications.  CALL THE OFFICE FOR: . Temperature above 101 degrees . Excessive bleeding or drainage on the dressing. . Excessive swelling, coldness, or paleness of the fingers. . Persistent nausea and vomiting.  FOLLOW-UP:  . You should have an appointment to return to the office in 7-10 days after surgery.   REMEMBER: R.I.C.E. = Rest, Ice, Compression, Elevation !     [facility] Department Directory         [email or web address]       [email or web address]          Cardiology  Appointments: [place] - [phone] [place] - [phone]  Endocrinology  Appointments: [place] - [phone] [place] - [phone]  Gastroenterology  Appointments: [place] - [phone] [place] - [phone]        General  Surgery   Appointments: [place] - [phone]  Internal Medicine/Family Medicine  Appointments: [place] - [phone] [place] - [phone] [place] - [phone]  Metabolic and Weigh Loss Surgery  Appointments: [place] - [phone]        Neurology  Appointments: [place] - [phone] [place] - [phone]  Neurosurgery  Appointments: [place] - [phone]  Obstetrics & Gynecology  Appointments: [place] - [phone] [place] - [phone]        Pediatrics  Appointments: [place] - [phone] [place] - [phone]  Physiatry  Appointments: [place] -[phone]  Physical Therapy  Appointments: [place] - [phone] [place] - [phone]        Podiatry  Appointments: [place] - [phone] [place] - [phone]  Pulmonology  Appointments: [place] - [phone]  Rheumatology  Appointments: [place] - [phone]        [place] Location: [facility]  [address] [place], [place]  [address]  [place] Location: [facility] [address] [place], [place]  [address]  [place] Location: [facility] [address] [place], [place]  [address]     AMBULATORY SURGERY  DISCHARGE INSTRUCTIONS   1) The drugs that you were given will stay in your system until tomorrow so for the next 24 hours you should not:  A) Drive an automobile B) Make any legal decisions C) Drink any alcoholic beverage   2) You may resume regular meals tomorrow.  Today it is better to start with liquids and gradually work up to solid foods.  You may eat anything you prefer, but it is better   to start with liquids, then soup and crackers, and gradually work up to solid foods.   3) Please notify your doctor immediately if you have any unusual bleeding, trouble breathing, redness and pain at the surgery site, drainage, fever, or pain not relieved by medication.    4) Additional Instructions:        Please contact your  physician with any problems or Same Day Surgery at [phone], Monday through Friday 6 am to 4 pm, or [place] at [place] Main number at [phone]. 

## 2019-12-22 NOTE — Op Note (Signed)
OPERATIVE NOTE  DATE OF SURGERY:  12/22/2019  PATIENT NAME:  Gina Middleton   DOB: 13-Nov-1935  MRN: [id number]  PRE-OPERATIVE DIAGNOSIS: Right carpal tunnel syndrome  POST-OPERATIVE DIAGNOSIS:  Same  PROCEDURE:  Right carpal tunnel release  SURGEON:  Marciano Sequin. M.D.  ANESTHESIA: general  ESTIMATED BLOOD LOSS: Minimal  FLUIDS REPLACED: 700 mL of crystalloid  TOURNIQUET TIME: 32 minutes  DRAINS: None  INDICATIONS FOR SURGERY: Gina Middleton is a 84 y.o. year old female with a long history of numbness and paresthesias to the right hand. Findings were consistent with carpal tunnel syndrome.The patient had not seen any significant improvement despite conservative nonsurgical intervention. After discussion of the risks and benefits of surgical intervention, the patient expressed understanding of the risks benefits and agree with plans for carpal tunnel release.   PROCEDURE IN DETAIL: The patient was brought into the operating room and after adequate general anesthesia, a tourniquet was placed on the patient's right upper arm.The right hand and arm were prepped with alcohol and Duraprep and draped in the usual sterile fashion. A "time-out" was performed as per usual protocol. The hand and forearm were exsanguinated using an Esmarch and the tourniquet was inflated to 250 mmHg. Loupe magnification was used throughout the procedure. An incision was made just ulnar to the thenar palmar crease. Dissection was carried down through the palmar fascia to the transverse carpal ligament. The transverse carpal ligament was sharply incised, taking care to protect the underlying structures with the carpal tunnel. Complete release of the transverse carpal ligament was achieved. There was no evidence of ganglion cyst or lipoma within the carpal tunnel. The wound was irrigated with copious amounts of normal saline with antibiotic solution. The skin was then re-approximated with interrupted  sutures of #5-0 nylon. A sterile dressing was applied followed by application of a volar splint. The tourniquet was deflated with a total tourniquet time of 32 minutes.  The patient tolerated the procedure well and was transported to the PACU in stable condition.  Gina Razon P. Holley Bouche., M.D.

## 2019-12-22 NOTE — OR Nursing (Signed)
Patient is complaining of left leg and hip chronic pain no pain in the wrist. States she cannot take tramadol it makes her dizzy and nauseated Dr. Marry Guan notified.

## 2019-12-22 NOTE — Anesthesia Procedure Notes (Signed)
Procedure Name: LMA Insertion Performed by: Fredderick Phenix, CRNA Pre-anesthesia Checklist: Patient identified, Emergency Drugs available, Suction available and Patient being monitored Patient Re-evaluated:Patient Re-evaluated prior to induction Oxygen Delivery Method: Circle system utilized Preoxygenation: Pre-oxygenation with 100% oxygen Induction Type: IV induction Ventilation: Mask ventilation without difficulty LMA Size: 3.0 Tube type: Oral Number of attempts: 1 Airway Equipment and Method: Stylet and Oral airway Placement Confirmation: positive ETCO2 and breath sounds checked- equal and bilateral Tube secured with: Tape Dental Injury: Teeth and Oropharynx as per pre-operative assessment

## 2019-12-23 ENCOUNTER — Encounter: Payer: Self-pay | Admitting: Orthopedic Surgery

## 2019-12-23 NOTE — Anesthesia Postprocedure Evaluation (Signed)
Anesthesia Post Note  Patient: Gina Middleton  Procedure(s) Performed: CARPAL TUNNEL RELEASE (Right Wrist)  Patient location during evaluation: PACU Anesthesia Type: General Level of consciousness: awake and alert Pain management: pain level controlled Vital Signs Assessment: post-procedure vital signs reviewed and stable Respiratory status: spontaneous breathing, nonlabored ventilation and respiratory function stable Cardiovascular status: blood pressure returned to baseline and stable Postop Assessment: no apparent nausea or vomiting Anesthetic complications: no   No complications documented.   Last Vitals:  Vitals:   12/22/19 1519 12/22/19 1541  BP: 99/75 (!) 160/78  Pulse: 99   Resp: 18   Temp: 36.4 C   SpO2: 99%     Last Pain:  Vitals:   12/22/19 1519  TempSrc: Temporal  PainSc: [place]

## 2020-01-10 ENCOUNTER — Ambulatory Visit: Payer: Self-pay | Admitting: Podiatry

## 2020-01-24 ENCOUNTER — Other Ambulatory Visit: Payer: Self-pay | Admitting: Physician Assistant

## 2020-01-24 DIAGNOSIS — I10 Essential (primary) hypertension: Secondary | ICD-10-CM

## 2020-01-24 MED ORDER — LOSARTAN POTASSIUM-HCTZ 50-12.5 MG PO TABS: 1.0000 | ORAL_TABLET | Freq: Every day | ORAL | 1 refills | Status: DC

## 2020-01-24 NOTE — Telephone Encounter (Signed)
Medication Refill - Medication: losartan-hydrochlorothiazide (HYZAAR) 50-12.5 MG tablet (Patient would like request expedited and stated that pharmacy has tired reaching out multiple times last week to get prescription sigend off on and sent back over)   Has the patient contacted their pharmacy? Yes (Agent: If no, request that the patient contact the pharmacy for the refill.) (Agent: If yes, when and what did the pharmacy advise?)Contact PCP  Preferred Pharmacy (with phone number or street name):  [id number] [address], [place] Phone:  [phone]  Fax:  [phone]       Agent: Please be advised that RX refills may take up to 3 business days. We ask that you follow-up with your pharmacy.

## 2020-02-06 DIAGNOSIS — M1612 Unilateral primary osteoarthritis, left hip: Secondary | ICD-10-CM | POA: Insufficient documentation

## 2020-03-09 ENCOUNTER — Telehealth: Payer: Self-pay | Admitting: Physician Assistant

## 2020-03-09 NOTE — Telephone Encounter (Signed)
Copied from [place] [phone]. Topic: Medicare AWV >> Mar 09, 2020 11:01 AM Cher Nakai R wrote: Reason for CRM:   Left message for patient to call back and schedule Medicare Annual Wellness Visit (AWV) in office.   If not able to come in office, please offer to do virtually or by telephone.   Last AWV 03/11/2019  Please schedule at anytime with [facility] Health Advisor.  If any questions, please contact me at [phone]

## 2020-03-14 ENCOUNTER — Telehealth: Payer: Self-pay | Admitting: Physician Assistant

## 2020-03-14 NOTE — Progress Notes (Unsigned)
Subjective:   Gina Middleton is a 85 y.o. female who presents for Medicare Annual (Subsequent) preventive examination.  I connected with Gina Middleton today by telephone and verified that I am speaking with the correct person using two identifiers. Location patient: home Location provider: work Persons participating in the virtual visit: patient, provider.   I discussed the limitations, risks, security and privacy concerns of performing an evaluation and management service by telephone and the availability of in person appointments. I also discussed with the patient that there may be a patient responsible charge related to this service. The patient expressed understanding and verbally consented to this telephonic visit.    Interactive audio and video telecommunications were attempted between this provider and patient, however failed, due to patient having technical difficulties OR patient did not have access to video capability.  We continued and completed visit with audio only.   Review of Systems    N/A  Cardiac Risk Factors include: advanced age (>46men, >21 women);hypertension     Objective:    There were no vitals filed for this visit. There is no height or weight on file to calculate BMI.  Advanced Directives 03/15/2020 12/15/2019 08/25/2019 08/25/2019 03/11/2019 04/15/2018 04/09/2018  Does Patient Have a Medical Advance Directive? Yes Yes Yes Yes Yes Yes Yes  Type of Paramedic of [place];Living will - Living will Living will [place];Living will [place];Living will Living will  Does patient want to make changes to medical advance directive? - - No - Patient declined - - - No - Patient declined  Copy of [place] in Chart? Yes - validated most recent copy scanned in chart (See row information) - - - No - copy requested - -    Current Medications (verified) Outpatient Encounter Medications  as of 03/15/2020  Medication Sig  . aspirin EC 81 MG tablet Take 81 mg by mouth daily. Swallow whole.  . losartan-hydrochlorothiazide (HYZAAR) 50-12.5 MG tablet Take 1 tablet by mouth daily.  . meloxicam (MOBIC) 7.5 MG tablet Take 7.5 mg by mouth daily.  . traMADol (ULTRAM) 50 MG tablet Take 1 tablet (50 mg total) by mouth every 6 (six) hours as needed. (Patient not taking: Reported on 03/15/2020)   No facility-administered encounter medications on file as of 03/15/2020.    Allergies (verified) Other, Hydrocodone-acetaminophen, Lisinopril, Oxycodone-acetaminophen, and Iodinated diagnostic agents   History: Past Medical History:  Diagnosis Date  . Actinic keratosis 10/08/2012   Left mid to distal tricep   . Arthritis   . Basal cell carcinoma 07/21/2018   Left inferior antihelix  . Complication of anesthesia   . History of kidney stones   . Hypertension   . Pneumonia 03/2017  . PONV (postoperative nausea and vomiting) 1980's   n/v with lap choley and n/v after 08-2019 hip surgery   Past Surgical History:  Procedure Laterality Date  . APPENDECTOMY    . [place]  . CARPAL TUNNEL RELEASE Right 12/22/2019   Procedure: CARPAL TUNNEL RELEASE;  Surgeon: Dereck Leep, MD;  Location: [facility] ORS;  Service: Orthopedics;  Laterality: Right;  . CHOLECYSTECTOMY    . CYSTOSCOPY W/ RETROGRADES Left 04/09/2018   Procedure: CYSTOSCOPY WITH RETROGRADE PYELOGRAM;  Surgeon: Abbie Sons, MD;  Location: [facility] OR[facility];  Service: Urology;  Laterality: Left;  . CYSTOSCOPY WITH STENT PLACEMENT Left 04/09/2018   Procedure: CYSTOSCOPY WITH STENT PLACEMENT-LEFT;  Surgeon: Abbie Sons, MD;  Location: [facility];  Service: Urology;  Laterality: Left;  . CYSTOSCOPY WITH URETEROSCOPY Left 04/09/2018   Procedure: POSSIBLE URETEROSCOPY-LEFT;  Surgeon: Abbie Sons, MD;  Location: [facility] OR[facility];  Service: Urology;  Laterality: Left;  . TOTAL HIP ARTHROPLASTY Right 08/25/2019   Procedure: TOTAL HIP  ARTHROPLASTY;  Surgeon: Dereck Leep, MD;  Location: [facility] OR[facility];  Service: Orthopedics;  Laterality: Right;   Family History  Problem Relation Age of Onset  . Hypertension Mother   . Alzheimer's disease Mother   . Heart attack Father        x's 3  . Diabetes Sister   . Heart attack Sister   . Breast cancer Maternal Grandmother    Social History   Socioeconomic History  . Marital status: Married    Spouse name: Kyung Rudd  . Number of children: 0  . Years of education: College  . Highest education level: Master's degree (e.g., MA, MS, MEng, MEd, MSW, MBA)  Occupational History  . Occupation: Retired Programmer, multimedia  . Occupation: Works part-time at [employer] and [employer]  . Occupation: [employer]    Employer: [employer]    Comment: Part Time  Tobacco Use  . Smoking status: Never Smoker  . Smokeless tobacco: Never Used  Vaping Use  . Vaping Use: Never used  Substance and Sexual Activity  . Alcohol use: Not Currently  . Drug use: No  . Sexual activity: Never  Other Topics Concern  . Not on file  Social History Narrative  . Not on file   Social Determinants of Health   Financial Resource Strain: Low Risk   . Difficulty of Paying Living Expenses: Not hard at all  Food Insecurity: No Food Insecurity  . Worried About Charity fundraiser in the Last Year: Never true  . Ran Out of Food in the Last Year: Never true  Transportation Needs: No Transportation Needs  . Lack of Transportation (Medical): No  . Lack of Transportation (Non-Medical): No  Physical Activity: Inactive  . Days of Exercise per Week: 0 days  . Minutes of Exercise per Session: 0 min  Stress: No Stress Concern Present  . Feeling of Stress : Not at all  Social Connections: Moderately Integrated  . Frequency of Communication with Friends and Family: More than three times a week  . Frequency of Social Gatherings with Friends and Family: More than three times a week  . Attends Religious Services:  Never  . Active Member of Clubs or Organizations: Yes  . Attends Archivist Meetings: More than 4 times per year  . Marital Status: Married    Tobacco Counseling Counseling given: Not Answered   Clinical Intake:  Pre-visit preparation completed: Yes  Pain : No/denies pain (Has left hip pain when up and moving.)     Nutritional Risks: None Diabetes: No  How often do you need to have someone help you when you read instructions, pamphlets, or other written materials from your doctor or pharmacy?: 1 - Never  Diabetic? No  Interpreter Needed?: No  Information entered by :: [facility], LPN   Activities of Daily Living In your present state of health, do you have any difficulty performing the following activities: 03/15/2020 12/15/2019  Hearing? N N  Vision? N N  Difficulty concentrating or making decisions? N N  Walking or climbing stairs? Y Y  Comment Due to right hip pain. due to left hip pain  Dressing or bathing? N N  Doing errands, shopping? N [employer]  and eating ? N -  Using the Toilet? N -  In the past six months, have you accidently leaked urine? N -  Do you have problems with loss of bowel control? N -  Managing your Medications? N -  Managing your Finances? N -  Housekeeping or managing your Housekeeping? N -  Some recent data might be hidden    Patient Care Team: [name], PA-C as PCP - General (Family Medicine) Ralene Bathe, MD (Dermatology) Abbie Sons, MD (Urology) Eulogio Bear, MD as Consulting Physician (Ophthalmology) Marry Guan, Laurice Record, MD (Orthopedic Surgery)  Indicate any recent Medical Services you may have received from other than [facility] providers in the past year (date may be approximate).     Assessment:   This is a routine wellness examination for [place].  Hearing/Vision screen No exam data present  Dietary issues and exercise activities discussed: Current Exercise Habits: The patient does not  participate in regular exercise at present, Exercise limited by: orthopedic condition(s)  Goals    . DIET - INCREASE WATER INTAKE     Recommend to drink at least 6-8 8oz glasses of water per day.      Depression Screen PHQ 2/9 Scores 03/15/2020 12/03/2019 11/19/2019 03/11/2019 03/11/2019 02/24/2018 02/24/2018  PHQ - 2 Score 0 0 0 0 0 0 0  PHQ- 9 Score - - 0 - - 0 -    Fall Risk Fall Risk  03/15/2020 12/03/2019 11/19/2019 03/11/2019 02/24/2018  Falls in the past year? 0 0 0 0 0  Number falls in past yr: 0 0 0 0 0  Injury with Fall? 0 0 0 0 0  Follow up - Falls evaluation completed Falls evaluation completed - -    FALL RISK PREVENTION PERTAINING TO THE HOME:  Any stairs in or around the home? Yes  If so, are there any without handrails? No  Home free of loose throw rugs in walkways, pet beds, electrical cords, etc? Yes  Adequate lighting in your home to reduce risk of falls? Yes   ASSISTIVE DEVICES UTILIZED TO PREVENT FALLS:  Life alert? No  Use of a cane, walker or w/c? Yes  Grab bars in the bathroom? Yes  Shower chair or bench in shower? No Elevated toilet seat or a handicapped toilet? Yes    Cognitive Function: Normal cognitive status assessed by observation by this Nurse Health Advisor. No abnormalities found.       6CIT Screen 02/24/2018  What Year? 0 points  What month? 0 points  What time? 0 points  Count back from 20 0 points  Months in reverse 0 points  Repeat phrase 0 points  Total Score 0    Immunizations Immunization History  Administered Date(s) Administered  . PFIZER(Purple Top)SARS-COV-2 Vaccination 11/18/2019, 12/09/2019  . Pneumococcal Conjugate-13 08/28/2016  . Pneumococcal Polysaccharide-23 09/12/2017  . Td 05/10/1995    TDAP status: Due, Education has been provided regarding the importance of this vaccine. Advised may receive this vaccine at local pharmacy or Health Dept. Aware to provide a copy of the vaccination record if obtained from local pharmacy  or Health Dept. Verbalized acceptance and understanding.  Flu Vaccine status: Declined, Education has been provided regarding the importance of this vaccine but patient still declined. Advised may receive this vaccine at local pharmacy or Health Dept. Aware to provide a copy of the vaccination record if obtained from local pharmacy or Health Dept. Verbalized acceptance and understanding.  Pneumococcal vaccine status: Up to date  Covid-19 vaccine status: Completed vaccines  Qualifies for Shingles Vaccine? Yes   Zostavax completed No   Shingrix Completed?: No.    Education has been provided regarding the importance of this vaccine. Patient has been advised to call insurance company to determine out of pocket expense if they have not yet received this vaccine. Advised may also receive vaccine at local pharmacy or Health Dept. Verbalized acceptance and understanding.  Screening Tests Health Maintenance  Topic Date Due  . COVID-19 Vaccine (3 - Pfizer risk 4-dose series) 01/06/2020  . DEXA SCAN  02/08/2020  . INFLUENZA VACCINE  05/18/2020 (Originally 09/19/2019)  . TETANUS/TDAP  03/15/2021 (Originally 05/09/2005)  . PNA vac Low Risk Adult  Completed    Health Maintenance  Health Maintenance Due  Topic Date Due  . COVID-19 Vaccine (3 - Pfizer risk 4-dose series) 01/06/2020  . DEXA SCAN  02/08/2020    Colorectal cancer screening: No longer required.   Mammogram status: No longer required due to age.  Bone Density status: Currently due, declined order today.   Lung Cancer Screening: (Low Dose CT Chest recommended if Age 10-80 years, 30 pack-year currently smoking OR have quit w/in 15years.) does not qualify.   Additional Screening:  Vision Screening: Recommended annual ophthalmology exams for early detection of glaucoma and other disorders of the eye. Is the patient up to date with their annual eye exam?  Yes  Who is the provider or what is the name of the office in which the patient  attends annual eye exams? Dr Edison Pace @ [facility] If pt is not established with a provider, would they like to be referred to a provider to establish care? No .   Dental Screening: Recommended annual dental exams for proper oral hygiene  Community Resource Referral / Chronic Care Management: CRR required this visit?  No   CCM required this visit?  No      Plan:     I have personally reviewed and noted the following in the patient's chart:   . Medical and social history . Use of alcohol, tobacco or illicit drugs  . Current medications and supplements . Functional ability and status . Nutritional status . Physical activity . Advanced directives . List of other physicians . Hospitalizations, surgeries, and ER visits in previous 12 months . Vitals . Screenings to include cognitive, depression, and falls . Referrals and appointments  In addition, I have reviewed and discussed with patient certain preventive protocols, quality metrics, and best practice recommendations. A written personalized care plan for preventive services as well as general preventive health recommendations were provided to patient.     Kein Carlberg [place], [place]   9/38/1017   Nurse Notes: Pt declined a DEXA order or receiving a future flu vaccine.

## 2020-03-14 NOTE — Telephone Encounter (Signed)
Copied from [place] [phone]. Topic: Medicare AWV >> Mar 14, 2020 11:07 AM Cher Nakai R wrote: Reason for CRM:   Patient returned my call.  In middle of conversation we lost connection.  Tried to contact her back to schedule AWVS and had to leave a message

## 2020-03-15 ENCOUNTER — Ambulatory Visit (INDEPENDENT_AMBULATORY_CARE_PROVIDER_SITE_OTHER): Payer: Medicare PPO

## 2020-03-15 ENCOUNTER — Other Ambulatory Visit: Payer: Self-pay

## 2020-03-15 DIAGNOSIS — Z0000 Encounter for general adult medical examination without abnormal findings: Secondary | ICD-10-CM

## 2020-03-15 NOTE — Patient Instructions (Signed)
Ms. Gina Middleton , Thank you for taking time to come for your Medicare Wellness Visit. I appreciate your ongoing commitment to your health goals. Please review the following plan we discussed and let me know if I can assist you in the future.   Screening recommendations/referrals: Colonoscopy: No longer required.  Mammogram: No longer required.  Bone Density: Currently due, declined an order for a DEXA scan at this time.  Recommended yearly ophthalmology/optometry visit for glaucoma screening and checkup Recommended yearly dental visit for hygiene and checkup  Vaccinations: Influenza vaccine: Currently due, declined receiving. Pneumococcal vaccine: Completed series Tdap vaccine: Currently due, declined receiving.  Shingles vaccine: Shingrix discussed. Please contact your pharmacy for coverage information.     Advanced directives: Currently on file.   Conditions/risks identified: Recommend to drink at least 6-8 8oz glasses of water per day.  Next appointment: 03/17/20 @ 1:00 PM with [place] 65 Years and Older, Female Preventive care refers to lifestyle choices and visits with your health care provider that can promote health and wellness. What does preventive care include?  A yearly physical exam. This is also called an annual well check.  Dental exams once or twice a year.  Routine eye exams. Ask your health care provider how often you should have your eyes checked.  Personal lifestyle choices, including:  Daily care of your teeth and gums.  Regular physical activity.  Eating a healthy diet.  Avoiding tobacco and drug use.  Limiting alcohol use.  Practicing safe sex.  Taking low-dose aspirin every day.  Taking vitamin and mineral supplements as recommended by your health care provider. What happens during an annual well check? The services and screenings done by your health care provider during your annual well check will depend on your age,  overall health, lifestyle risk factors, and family history of disease. Counseling  Your health care provider may ask you questions about your:  Alcohol use.  Tobacco use.  Drug use.  Emotional well-being.  Home and relationship well-being.  Sexual activity.  Eating habits.  History of falls.  Memory and ability to understand (cognition).  Work and work Statistician.  Reproductive health. Screening  You may have the following tests or measurements:  Height, weight, and BMI.  Blood pressure.  Lipid and cholesterol levels. These may be checked every 5 years, or more frequently if you are over 83 years old.  Skin check.  Lung cancer screening. You may have this screening every year starting at age 83 if you have a 30-pack-year history of smoking and currently smoke or have quit within the past 15 years.  Fecal occult blood test (FOBT) of the stool. You may have this test every year starting at age 83.  Flexible sigmoidoscopy or colonoscopy. You may have a sigmoidoscopy every 5 years or a colonoscopy every 10 years starting at age 83.  Hepatitis C blood test.  Hepatitis B blood test.  Sexually transmitted disease (STD) testing.  Diabetes screening. This is done by checking your blood sugar (glucose) after you have not eaten for a while (fasting). You may have this done every 1-3 years.  Bone density scan. This is done to screen for osteoporosis. You may have this done starting at age 83.  Mammogram. This may be done every 1-2 years. Talk to your health care provider about how often you should have regular mammograms. Talk with your health care provider about your test results, treatment options, and if necessary, the need for more  tests. Vaccines  Your health care provider may recommend certain vaccines, such as:  Influenza vaccine. This is recommended every year.  Tetanus, diphtheria, and acellular pertussis (Tdap, Td) vaccine. You may need a Td booster every 10  years.  Zoster vaccine. You may need this after age 83.  Pneumococcal 13-valent conjugate (PCV13) vaccine. One dose is recommended after age 83.  Pneumococcal polysaccharide (PPSV23) vaccine. One dose is recommended after age 83. Talk to your health care provider about which screenings and vaccines you need and how often you need them. This information is not intended to replace advice given to you by your health care provider. Make sure you discuss any questions you have with your health care provider. Document Released: 03/03/2015 Document Revised: 10/25/2015 Document Reviewed: 12/06/2014 Elsevier Interactive Patient Education  2017 [place] Prevention in the Home Falls can cause injuries. They can happen to people of all ages. There are many things you can do to make your home safe and to help prevent falls. What can I do on the outside of my home?  Regularly fix the edges of walkways and driveways and fix any cracks.  Remove anything that might make you trip as you walk through a door, such as a raised step or threshold.  Trim any bushes or trees on the path to your home.  Use bright outdoor lighting.  Clear any walking paths of anything that might make someone trip, such as rocks or tools.  Regularly check to see if handrails are loose or broken. Make sure that both sides of any steps have handrails.  Any raised decks and porches should have guardrails on the edges.  Have any leaves, snow, or ice cleared regularly.  Use sand or salt on walking paths during winter.  Clean up any spills in your garage right away. This includes oil or grease spills. What can I do in the bathroom?  Use night lights.  Install grab bars by the toilet and in the tub and shower. Do not use towel bars as grab bars.  Use non-skid mats or decals in the tub or shower.  If you need to sit down in the shower, use a plastic, non-slip stool.  Keep the floor dry. Clean up any water that  spills on the floor as soon as it happens.  Remove soap buildup in the tub or shower regularly.  Attach bath mats securely with double-sided non-slip rug tape.  Do not have throw rugs and other things on the floor that can make you trip. What can I do in the bedroom?  Use night lights.  Make sure that you have a light by your bed that is easy to reach.  Do not use any sheets or blankets that are too big for your bed. They should not hang down onto the floor.  Have a firm chair that has side arms. You can use this for support while you get dressed.  Do not have throw rugs and other things on the floor that can make you trip. What can I do in the kitchen?  Clean up any spills right away.  Avoid walking on wet floors.  Keep items that you use a lot in easy-to-reach places.  If you need to reach something above you, use a strong step stool that has a grab bar.  Keep electrical cords out of the way.  Do not use floor polish or wax that makes floors slippery. If you must use wax, use non-skid floor  wax.  Do not have throw rugs and other things on the floor that can make you trip. What can I do with my stairs?  Do not leave any items on the stairs.  Make sure that there are handrails on both sides of the stairs and use them. Fix handrails that are broken or loose. Make sure that handrails are as long as the stairways.  Check any carpeting to make sure that it is firmly attached to the stairs. Fix any carpet that is loose or worn.  Avoid having throw rugs at the top or bottom of the stairs. If you do have throw rugs, attach them to the floor with carpet tape.  Make sure that you have a light switch at the top of the stairs and the bottom of the stairs. If you do not have them, ask someone to add them for you. What else can I do to help prevent falls?  Wear shoes that:  Do not have high heels.  Have rubber bottoms.  Are comfortable and fit you well.  Are closed at the  toe. Do not wear sandals.  If you use a stepladder:  Make sure that it is fully opened. Do not climb a closed stepladder.  Make sure that both sides of the stepladder are locked into place.  Ask someone to hold it for you, if possible.  Clearly mark and make sure that you can see:  Any grab bars or handrails.  First and last steps.  Where the edge of each step is.  Use tools that help you move around (mobility aids) if they are needed. These include:  Canes.  Walkers.  Scooters.  Crutches.  Turn on the lights when you go into a dark area. Replace any light bulbs as soon as they burn out.  Set up your furniture so you have a clear path. Avoid moving your furniture around.  If any of your floors are uneven, fix them.  If there are any pets around you, be aware of where they are.  Review your medicines with your doctor. Some medicines can make you feel dizzy. This can increase your chance of falling. Ask your doctor what other things that you can do to help prevent falls. This information is not intended to replace advice given to you by your health care provider. Make sure you discuss any questions you have with your health care provider. Document Released: 12/01/2008 Document Revised: 07/13/2015 Document Reviewed: 03/11/2014 Elsevier Interactive Patient Education  2017 [employer].

## 2020-03-16 ENCOUNTER — Encounter: Payer: Self-pay | Admitting: Physician Assistant

## 2020-03-17 ENCOUNTER — Encounter: Payer: Self-pay | Admitting: Physician Assistant

## 2020-03-17 ENCOUNTER — Ambulatory Visit (INDEPENDENT_AMBULATORY_CARE_PROVIDER_SITE_OTHER): Payer: Medicare PPO | Admitting: Physician Assistant

## 2020-03-17 ENCOUNTER — Other Ambulatory Visit: Payer: Self-pay

## 2020-03-17 VITALS — BP 160/96 | HR 73 | Temp 98.3°F | Ht 63.0 in | Wt 120.0 lb

## 2020-03-17 DIAGNOSIS — M1610 Unilateral primary osteoarthritis, unspecified hip: Secondary | ICD-10-CM

## 2020-03-17 DIAGNOSIS — Z0000 Encounter for general adult medical examination without abnormal findings: Secondary | ICD-10-CM | POA: Diagnosis not present

## 2020-03-17 DIAGNOSIS — R739 Hyperglycemia, unspecified: Secondary | ICD-10-CM

## 2020-03-17 DIAGNOSIS — I10 Essential (primary) hypertension: Secondary | ICD-10-CM

## 2020-03-17 NOTE — Progress Notes (Signed)
Annual Wellness Visit     Patient: Gina [place], Female    DOB: 1935/06/06, 85 y.o.   MRN: [id number] Visit Date: 03/17/2020  Today's Provider: [name], PA-C   Chief Complaint  Patient presents with  . Annual Exam   Subjective    Gina [place] is a 85 y.o. female who presents today for her Annual Wellness Visit. She reports consuming a general diet. Exercise is limited by orthopedic condition(s): hip osteoarthritis; having replacment in March 2022. She generally feels well. She reports sleeping well. She does not have additional problems to discuss today.   HPI  Scheduled for left hip replacement in March 2022.   Medications: Outpatient Medications Prior to Visit  Medication Sig  . aspirin EC 81 MG tablet Take 81 mg by mouth daily. Swallow whole.  . losartan-hydrochlorothiazide (HYZAAR) 50-12.5 MG tablet Take 1 tablet by mouth daily.  . meloxicam (MOBIC) 7.5 MG tablet Take 7.5 mg by mouth daily.  . traMADol (ULTRAM) 50 MG tablet Take 1 tablet (50 mg total) by mouth every 6 (six) hours as needed. (Patient not taking: No sig reported)   No facility-administered medications prior to visit.    Allergies  Allergen Reactions  . Other Anaphylaxis    A muscle relaxer in the 70s but cannot remember name of the medication that caused it  . Hydrocodone-Acetaminophen Other (See Comments)    dizziness  . Lisinopril     Hair thinning  . Oxycodone-Acetaminophen Nausea And Vomiting  . Iodinated Diagnostic Agents Rash    Patient Care Team: [name], PA-C as PCP - General (Family Medicine) Ralene Bathe, MD (Dermatology) Abbie Sons, MD (Urology) Eulogio Bear, MD as Consulting Physician (Ophthalmology) Marry Guan, Laurice Record, MD (Orthopedic Surgery)  Review of Systems  Constitutional: Negative.   HENT: Negative.   Eyes: Negative.   Respiratory: Negative.   Cardiovascular: Negative.   Gastrointestinal: Negative.   Endocrine:  Negative.   Genitourinary: Negative.   Musculoskeletal: Positive for arthralgias. Negative for back pain, gait problem, joint swelling, myalgias, neck pain and neck stiffness.  Skin: Negative.   Allergic/Immunologic: Negative.   Neurological: Negative.   Hematological: Negative.   Psychiatric/Behavioral: Negative.       Objective    Vitals: BP (!) 151/100 (BP Location: Left Arm, Patient Position: Sitting, Cuff Size: Normal)   Pulse 73   Temp 98.3 F (36.8 C) (Oral)   Ht 5\' 3"  (1.6 m)   Wt 120 lb (54.4 kg)   BMI 21.26 kg/m     Physical Exam Vitals reviewed.  Constitutional:      General: She is not in acute distress.    Appearance: Normal appearance. She is well-developed, normal weight and well-nourished. She is not ill-appearing or diaphoretic.  HENT:     Head: Normocephalic and atraumatic.     Right Ear: Tympanic membrane, ear canal and external ear normal.     Left Ear: Tympanic membrane, ear canal and external ear normal.  Eyes:     General: No scleral icterus.       Right eye: No discharge.        Left eye: No discharge.     Extraocular Movements: Extraocular movements intact and EOM normal.     Conjunctiva/sclera: Conjunctivae normal.     Pupils: Pupils are equal, round, and reactive to light.  Neck:     Thyroid: No thyromegaly.     Vascular: No carotid bruit or JVD.  Trachea: No tracheal deviation.  Cardiovascular:     Rate and Rhythm: Normal rate and regular rhythm.     Pulses: Normal pulses and intact distal pulses.     Heart sounds: Normal heart sounds. No murmur heard. No friction rub. No gallop.   Pulmonary:     Effort: Pulmonary effort is normal. No respiratory distress.     Breath sounds: Normal breath sounds. No wheezing or rales.  Chest:     Chest wall: No tenderness.  Abdominal:     General: Abdomen is flat. Bowel sounds are normal. There is no distension.     Palpations: Abdomen is soft. There is no mass.     Tenderness: There is no  abdominal tenderness. There is no guarding or rebound.  Musculoskeletal:        General: No tenderness. Normal range of motion.     Cervical back: Normal range of motion and neck supple. No tenderness.     Right lower leg: No edema.     Left lower leg: No edema.  Lymphadenopathy:     Cervical: No cervical adenopathy.  Skin:    General: Skin is warm and dry.     Capillary Refill: Capillary refill takes less than 2 seconds.     Findings: No rash.  Neurological:     General: No focal deficit present.     Mental Status: She is alert and oriented to person, place, and time. Mental status is at baseline.     Gait: Gait abnormal (antalgic due to left hip OA).  Psychiatric:        Mood and Affect: Mood and affect and mood normal.        Behavior: Behavior normal.        Thought Content: Thought content normal.        Judgment: Judgment normal.     Most recent functional status assessment: In your present state of health, do you have any difficulty performing the following activities: 03/17/2020  Hearing? N  Vision? N  Difficulty concentrating or making decisions? N  Walking or climbing stairs? Y  Comment hip OA  Dressing or bathing? N  Doing errands, shopping? N  Preparing Food and eating ? -  Using the Toilet? -  In the past six months, have you accidently leaked urine? -  Do you have problems with loss of bowel control? -  Managing your Medications? -  Managing your Finances? -  Housekeeping or managing your Housekeeping? -  Some recent data might be hidden   Most recent fall risk assessment: Fall Risk  03/17/2020  Falls in the past year? 0  Number falls in past yr: 0  Injury with Fall? 0  Risk for fall due to : Orthopedic patient  Follow up Falls evaluation completed    Most recent depression screenings: PHQ 2/9 Scores 03/17/2020 03/15/2020  PHQ - 2 Score 0 0  PHQ- 9 Score - -   Most recent cognitive screening: 6CIT Screen 02/24/2018  What Year? 0 points  What month? 0  points  What time? 0 points  Count back from 20 0 points  Months in reverse 0 points  Repeat phrase 0 points  Total Score 0   Most recent Audit-C alcohol use screening Alcohol Use Disorder Test (AUDIT) 03/17/2020  1. How often do you have a drink containing alcohol? 0  2. How many drinks containing alcohol do you have on a typical day when you are drinking? 0  3. How often do  you have six or more drinks on one occasion? 0  AUDIT-C Score 0  Alcohol Brief Interventions/Follow-up AUDIT Score <7 follow-up not indicated   A score of 3 or more in women, and 4 or more in men indicates increased risk for alcohol abuse, EXCEPT if all of the points are from question 1   No results found for any visits on 03/17/20.  Assessment & Plan     Annual wellness visit done today including the all of the following: Reviewed patient's Family Medical History Reviewed and updated list of patient's medical providers Assessment of cognitive impairment was done Assessed patient's functional ability Established a written schedule for health screening [place] Completed and Reviewed  Exercise Activities and Dietary recommendations Goals    . DIET - INCREASE WATER INTAKE     Recommend to drink at least 6-8 8oz glasses of water per day.       Immunization History  Administered Date(s) Administered  . PFIZER(Purple Top)SARS-COV-2 Vaccination 11/18/2019, 12/09/2019  . Pneumococcal Conjugate-13 08/28/2016  . Pneumococcal Polysaccharide-23 09/12/2017  . Td 05/10/1995    Health Maintenance  Topic Date Due  . COVID-19 Vaccine (3 - Pfizer risk 4-dose series) 01/06/2020  . INFLUENZA VACCINE  05/18/2020 (Originally 09/19/2019)  . TETANUS/TDAP  03/15/2021 (Originally 05/09/2005)  . DEXA SCAN  03/17/2021 (Originally 02/08/2020)  . PNA vac Low Risk Adult  Completed     Discussed health benefits of physical activity, and encouraged her to engage in regular exercise appropriate for her  age and condition.    1. Annual physical exam Normal physical exam today. Will check labs as below and f/u pending lab results. If labs are stable and WNL she will not need to have these rechecked for one year at her next annual physical exam. She is to call the office in the meantime if she has any acute issue, questions or concerns. - CBC w/Diff/Platelet - Comprehensive Metabolic Panel (CMET) - Lipid Panel With LDL/HDL Ratio - HgB A1c  2. Primary hypertension Stable. Continue Losartan-HCTZ 50-12.5mg . Will check labs as below and f/u pending results. - CBC w/Diff/Platelet - Comprehensive Metabolic Panel (CMET) - Lipid Panel With LDL/HDL Ratio - HgB A1c  3. Arthritis of hip Scheduled for surgery in March 2022.   4. Blood glucose elevated Diet controlled. Will check labs as below and f/u pending results. - Comprehensive Metabolic Panel (CMET) - Lipid Panel With LDL/HDL Ratio - HgB A1c    No follow-ups on file.     [name], PA-C, have reviewed all documentation for this visit. The documentation on 03/17/20 for the exam, diagnosis, procedures, and orders are all accurate and complete.   [name]  [facility] [phone] (phone) [phone] (fax)  [place]

## 2020-03-18 LAB — COMPREHENSIVE METABOLIC PANEL
ALT: 10 IU/L (ref 0–32)
AST: 17 IU/L (ref 0–40)
Albumin/Globulin Ratio: 1.7 (ref 1.2–2.2)
Albumin: 4.3 g/dL (ref 3.6–4.6)
Alkaline Phosphatase: 111 IU/L (ref 44–121)
BUN/Creatinine Ratio: 21 (ref 12–28)
BUN: 26 mg/dL (ref 8–27)
Bilirubin Total: 0.8 mg/dL (ref 0.0–1.2)
CO2: 23 mmol/L (ref 20–29)
Calcium: 9.1 mg/dL (ref 8.7–10.3)
Chloride: 104 mmol/L (ref 96–106)
Creatinine, Ser: 1.23 mg/dL — ABNORMAL HIGH (ref 0.57–1.00)
GFR calc Af Amer: 47 mL/min/{1.73_m2} — ABNORMAL LOW (ref >59)
GFR calc non Af Amer: 41 mL/min/{1.73_m2} — ABNORMAL LOW (ref >59)
Globulin, Total: 2.5 g/dL (ref 1.5–4.5)
Glucose: 94 mg/dL (ref 65–99)
Potassium: 4.1 mmol/L (ref 3.5–5.2)
Sodium: 141 mmol/L (ref 134–144)
Total Protein: 6.8 g/dL (ref 6.0–8.5)

## 2020-03-18 LAB — LIPID PANEL WITH LDL/HDL RATIO
Cholesterol, Total: 152 mg/dL (ref 100–199)
HDL: 41 mg/dL (ref >39)
LDL Chol Calc (NIH): 88 mg/dL (ref 0–99)
LDL/HDL Ratio: 2.1 ratio (ref 0.0–3.2)
Triglycerides: 130 mg/dL (ref 0–149)
VLDL Cholesterol Cal: 23 mg/dL (ref 5–40)

## 2020-03-18 LAB — CBC WITH DIFFERENTIAL/PLATELET
Basophils Absolute: 0.1 10*3/uL (ref 0.0–0.2)
Basos: 1 %
EOS (ABSOLUTE): 0.1 10*3/uL (ref 0.0–0.4)
Eos: 2 %
Hematocrit: 34.1 % (ref 34.0–46.6)
Hemoglobin: 11.6 g/dL (ref 11.1–15.9)
Immature Grans (Abs): 0 10*3/uL (ref 0.0–0.1)
Immature Granulocytes: 1 %
Lymphocytes Absolute: 1.5 10*3/uL (ref 0.7–3.1)
Lymphs: 24 %
MCH: 30.8 pg (ref 26.6–33.0)
MCHC: 34 g/dL (ref 31.5–35.7)
MCV: 91 fL (ref 79–97)
Monocytes Absolute: 0.6 10*3/uL (ref 0.1–0.9)
Monocytes: 10 %
Neutrophils Absolute: 3.9 10*3/uL (ref 1.4–7.0)
Neutrophils: 62 %
Platelets: 221 10*3/uL (ref 150–450)
RBC: 3.77 x10E6/uL (ref 3.77–5.28)
RDW: 12.9 % (ref 11.7–15.4)
WBC: 6.2 10*3/uL (ref 3.4–10.8)

## 2020-03-18 LAB — HEMOGLOBIN A1C
Est. average glucose Bld gHb Est-mCnc: 108 mg/dL
Hgb A1c MFr Bld: 5.4 % (ref 4.8–5.6)

## 2020-04-09 NOTE — Discharge Instructions (Signed)
Instructions after Total Hip Replacement     Gina [place], Jr., M.D.     Dept. of Orthopaedics & Sports Medicine  [facility]  [address]  [place], [place]  [address]  Phone: [phone]   Fax: [phone]    DIET: . Drink plenty of non-alcoholic fluids. . Resume your normal diet. Include foods high in fiber.  ACTIVITY:  . You may use crutches or a walker with weight-bearing as tolerated, unless instructed otherwise. . You may be weaned off of the walker or crutches by your Physical Therapist.  . Do NOT reach below the level of your knees or cross your legs until allowed.    . Continue doing gentle exercises. Exercising will reduce the pain and swelling, increase motion, and prevent muscle weakness.   . Please continue to use the TED compression stockings for 6 weeks. You may remove the stockings at night, but should reapply them in the morning. . Do not drive or operate any equipment until instructed.  WOUND CARE:  . Continue to use ice packs periodically to reduce pain and swelling. . Keep the incision clean and dry. . You may bathe or shower after the staples are removed at the first office visit following surgery.  MEDICATIONS: . You may resume your regular medications. . Please take the pain medication as prescribed on the medication. . Do not take pain medication on an empty stomach. . You have been given a prescription for a blood thinner to prevent blood clots. Please take the medication as instructed. (NOTE: After completing a 2 week course of Lovenox, take one Enteric-coated aspirin once a day.) . Pain medications and iron supplements can cause constipation. Use a stool softener (Senokot or Colace) on a daily basis and a laxative (dulcolax or miralax) as needed. . Do not drive or drink alcoholic beverages when taking pain medications.  CALL THE OFFICE FOR: . Temperature above 101 degrees . Excessive bleeding or drainage on the dressing. . Excessive  swelling, coldness, or paleness of the toes. . Persistent nausea and vomiting.  FOLLOW-UP:  . You should have an appointment to return to the office in 6 weeks after surgery. . Arrangements have been made for continuation of Physical Therapy (either home therapy or outpatient therapy).     [facility] Department Directory         [email or web address]       [email or web address]          Cardiology  Appointments: [place] - [phone] [place] - [phone]  Endocrinology  Appointments: [place] - [phone] [place] - [phone]  Gastroenterology  Appointments: [place] - [phone] [place] - [phone]        General Surgery   Appointments: [place] - [phone]  Internal Medicine/Family Medicine  Appointments: [place] - [phone] [place] - [phone] [place] - [phone]  Metabolic and Weigh Loss Surgery  Appointments: [place] - [phone]        Neurology  Appointments: [place] - [phone] [place] - [phone]  Neurosurgery  Appointments: [place] - [phone]  Obstetrics & Gynecology  Appointments: [place] - [phone] [place] - [phone]        Pediatrics  Appointments: [place] - [phone] [place] - [phone]  Physiatry  Appointments: [place] -[phone]  Physical Therapy  Appointments: [place] - [phone] [place] - [phone]        Podiatry  Appointments: [place] - [phone] [place] - [phone]  Pulmonology  Appointments: [place] - [phone]  Rheumatology  Appointments: [place] - [phone]        [place] Location: [facility]  [address] , [place]  [address]  [place] Location: [facility] [address] [place], [place]  [address]  [place] Location: [facility] [address] [place], [place]  [address]    

## 2020-04-10 ENCOUNTER — Encounter
Admission: RE | Admit: 2020-04-10 | Discharge: 2020-04-10 | Disposition: A | Discharge status: Home / Self Care | Payer: Medicare PPO | Source: Ambulatory Visit | Attending: Orthopedic Surgery | Admitting: Orthopedic Surgery

## 2020-04-10 ENCOUNTER — Other Ambulatory Visit: Payer: Self-pay

## 2020-04-10 DIAGNOSIS — Z01818 Encounter for other preprocedural examination: Secondary | ICD-10-CM | POA: Diagnosis not present

## 2020-04-10 LAB — URINALYSIS, ROUTINE W REFLEX MICROSCOPIC
Bacteria, UA: NONE SEEN
Bilirubin Urine: NEGATIVE
Glucose, UA: NEGATIVE mg/dL
Hgb urine dipstick: NEGATIVE
Ketones, ur: NEGATIVE mg/dL
Leukocytes,Ua: NEGATIVE
Nitrite: NEGATIVE
Protein, ur: 30 mg/dL — AB
Specific Gravity, Urine: 1.018 (ref 1.005–1.030)
pH: 5 (ref 5.0–8.0)

## 2020-04-10 LAB — CBC WITH DIFFERENTIAL/PLATELET
Abs Immature Granulocytes: 0.02 10*3/uL (ref 0.00–0.07)
Basophils Absolute: 0 10*3/uL (ref 0.0–0.1)
Basophils Relative: 1 %
Eosinophils Absolute: 0.1 10*3/uL (ref 0.0–0.5)
Eosinophils Relative: 2 %
HCT: 34 % — ABNORMAL LOW (ref 36.0–46.0)
Hemoglobin: 11.5 g/dL — ABNORMAL LOW (ref 12.0–15.0)
Immature Granulocytes: 0 %
Lymphocytes Relative: 19 %
Lymphs Abs: 1.3 10*3/uL (ref 0.7–4.0)
MCH: 31 pg (ref 26.0–34.0)
MCHC: 33.8 g/dL (ref 30.0–36.0)
MCV: 91.6 fL (ref 80.0–100.0)
Monocytes Absolute: 0.6 10*3/uL (ref 0.1–1.0)
Monocytes Relative: 9 %
Neutro Abs: 4.8 10*3/uL (ref 1.7–7.7)
Neutrophils Relative %: 69 %
Platelets: 208 10*3/uL (ref 150–400)
RBC: 3.71 MIL/uL — ABNORMAL LOW (ref 3.87–5.11)
RDW: 12.9 % (ref 11.5–15.5)
WBC: 6.9 10*3/uL (ref 4.0–10.5)
nRBC: 0 % (ref 0.0–0.2)

## 2020-04-10 LAB — COMPREHENSIVE METABOLIC PANEL
ALT: 12 U/L (ref 0–44)
AST: 20 U/L (ref 15–41)
Albumin: 4 g/dL (ref 3.5–5.0)
Alkaline Phosphatase: 79 U/L (ref 38–126)
Anion gap: 7 (ref 5–15)
BUN: 26 mg/dL — ABNORMAL HIGH (ref 8–23)
CO2: 27 mmol/L (ref 22–32)
Calcium: 9 mg/dL (ref 8.9–10.3)
Chloride: 105 mmol/L (ref 98–111)
Creatinine, Ser: 0.93 mg/dL (ref 0.44–1.00)
GFR, Estimated: >60 mL/min (ref >60)
Glucose, Bld: 99 mg/dL (ref 70–99)
Potassium: 3.7 mmol/L (ref 3.5–5.1)
Sodium: 139 mmol/L (ref 135–145)
Total Bilirubin: 0.9 mg/dL (ref 0.3–1.2)
Total Protein: 7.5 g/dL (ref 6.5–8.1)

## 2020-04-10 LAB — PROTIME-INR
INR: 0.9 (ref 0.8–1.2)
Prothrombin Time: 12.2 seconds (ref 11.4–15.2)

## 2020-04-10 LAB — TYPE AND SCREEN
ABO/RH(D): O POS
Antibody Screen: NEGATIVE

## 2020-04-10 LAB — SURGICAL PCR SCREEN
MRSA, PCR: NEGATIVE
Staphylococcus aureus: POSITIVE — AB

## 2020-04-10 LAB — C-REACTIVE PROTEIN: CRP: 0.9 mg/dL (ref <1.0)

## 2020-04-10 LAB — SEDIMENTATION RATE: Sed Rate: 40 mm/hr — ABNORMAL HIGH (ref 0–30)

## 2020-04-10 LAB — APTT: aPTT: 28 seconds (ref 24–36)

## 2020-04-10 NOTE — Patient Instructions (Addendum)
Your procedure is scheduled on:04-19-20 WEDNESDAY Report to the Registration Desk on the 1st floor of the [facility]-Then proceed to the 2nd floor Surgery Desk in the [place] To find out your arrival time, please call [phone] between 1PM - 3PM on:04-18-20 TUESDAY  REMEMBER: Instructions that are not followed completely may result in serious medical risk, up to and including death; or upon the discretion of your surgeon and anesthesiologist your surgery may need to be rescheduled.  Do not eat food after midnight the night before surgery.  No gum chewing, lozengers or hard candies.  You may however, drink CLEAR liquids up to 2 hours before you are scheduled to arrive for your surgery. Do not drink anything within 2 hours of your scheduled arrival time.  Clear liquids include: - water  - apple juice without pulp - gatorade - black coffee or tea (Do NOT add milk or creamers to the coffee or tea) Do NOT drink anything that is not on this list.  In addition, your doctor has ordered for you to drink the provided  Ensure Pre-Surgery Clear Carbohydrate Drink  Drinking this carbohydrate drink up to two hours before surgery helps to reduce insulin resistance and improve patient outcomes. Please complete drinking 2 hours prior to scheduled arrival time.  TAKE THESE MEDICATIONS THE MORNING OF SURGERY WITH A SIP OF WATER: -NONE  Follow recommendations from Cardiologist, Pulmonologist or PCP regarding stopping Aspirin, Coumadin, Plavix, Eliquis, Pradaxa, or Pletal-STOP YOUR 81 MG ASPIRIN 7 DAYS PRIOR TO SURGERY-LAST DOSE ON 04-11-20 TUESDAY  One week prior to surgery: Stop Anti-inflammatories (NSAIDS) such asMOBIC (MELOXICAM),  Advil, Aleve, Ibuprofen, Motrin, Naproxen, Naprosyn and Aspirin based products such as Excedrin, Goodys Powder, BC Powder-OK TO TAKE TYLENOL/TRAMADOL IF NEEDED  Stop ANY OVER THE COUNTER supplements until after surgery.  No Alcohol for 24 hours before or after  surgery.  No Smoking including e-cigarettes for 24 hours prior to surgery.  No chewable tobacco products for at least 6 hours prior to surgery.  No nicotine patches on the day of surgery.  Do not use any "recreational" drugs for at least a week prior to your surgery.  Please be advised that the combination of cocaine and anesthesia may have negative outcomes, up to and including death. If you test positive for cocaine, your surgery will be cancelled.  On the morning of surgery brush your teeth with toothpaste and water, you may rinse your mouth with mouthwash if you wish. Do not swallow any toothpaste or mouthwash.  Do not wear jewelry, make-up, hairpins, clips or nail polish.  Do not wear lotions, powders, or perfumes.   Do not shave body from the neck down 48 hours prior to surgery just in case you cut yourself which could leave a site for infection.  Also, freshly shaved skin may become irritated if using the CHG soap.  Contact lenses, hearing aids and dentures may not be worn into surgery.  Do not bring valuables to the hospital. [facility] is not responsible for any missing/lost belongings or valuables.   Use CHG Soap as directed on instruction sheet.   Notify your doctor if there is any change in your medical condition (cold, fever, infection).  Wear comfortable clothing (specific to your surgery type) to the hospital.  Plan for stool softeners for home use; pain medications have a tendency to cause constipation. You can also help prevent constipation by eating foods high in fiber such as fruits and vegetables and drinking plenty of fluids  as your diet allows.  After surgery, you can help prevent lung complications by doing breathing exercises.  Take deep breaths and cough every 1-2 hours. Your doctor may order a device called an Incentive Spirometer to help you take deep breaths. When coughing or sneezing, hold a pillow firmly against your incision with both hands. This is  called "splinting." Doing this helps protect your incision. It also decreases belly discomfort.  If you are being admitted to the hospital overnight, leave your suitcase in the car. After surgery it may be brought to your room.  If you are being discharged the day of surgery, you will not be allowed to drive home. You will need a responsible adult (18 years or older) to drive you home and stay with you that night.   If you are taking public transportation, you will need to have a responsible adult (18 years or older) with you. Please confirm with your physician that it is acceptable to use public transportation.   Please call the [facility] at [phone] if you have any questions about these instructions.  Visitation Policy:  Patients undergoing a surgery or procedure may have one family member or support person with them as long as that person is not COVID-19 positive or experiencing its symptoms.  That person may remain in the waiting area during the procedure.  Inpatient Visitation:    Visiting hours are 7 a.m. to 8 p.m. Patients will be allowed one visitor. The visitor may change daily. The visitor must pass COVID-19 screenings, use hand sanitizer when entering and exiting the patient's room and wear a mask at all times, including in the patient's room. Patients must also wear a mask when staff or their visitor are in the room. Masking is required regardless of vaccination status. Systemwide, no visitors 17 or younger.

## 2020-04-11 ENCOUNTER — Ambulatory Visit: Payer: Medicare PPO | Admitting: Physician Assistant

## 2020-04-11 ENCOUNTER — Encounter: Payer: Self-pay | Admitting: Physician Assistant

## 2020-04-11 ENCOUNTER — Other Ambulatory Visit: Payer: Self-pay

## 2020-04-11 VITALS — BP 160/84 | HR 68 | Temp 98.3°F | Wt 122.0 lb

## 2020-04-11 DIAGNOSIS — I10 Essential (primary) hypertension: Secondary | ICD-10-CM | POA: Diagnosis not present

## 2020-04-11 MED ORDER — AMLODIPINE BESYLATE 5 MG PO TABS: 5.0000 mg | ORAL_TABLET | Freq: Every day | ORAL | 3 refills | Status: DC

## 2020-04-11 NOTE — Patient Instructions (Signed)
Amlodipine Tablets What is this medicine? AMLODIPINE (am LOE di peen) is a calcium channel blocker. It relaxes your blood vessels and decreases the amount of work the heart has to do. It treats high blood pressure and/or prevents chest pain (also called angina). This medicine may be used for other purposes; ask your health care provider or pharmacist if you have questions. COMMON BRAND NAME(S): Norvasc What should I tell my health care provider before I take this medicine? They need to know if you have any of these conditions:  heart disease  liver disease  an unusual or allergic reaction to amlodipine, other drugs, foods, dyes, or preservatives  pregnant or trying to get pregnant  breast-feeding How should I use this medicine? Take this medicine by mouth. Take it as directed on the prescription label at the same time every day. You can take it with or without food. If it upsets your stomach, take it with food. Keep taking it unless your health care provider tells you to stop. Talk to your health care provider about the use of this medicine in children. While it may be prescribed for children as young as 6 for selected conditions, precautions do apply. Overdosage: If you think you have taken too much of this medicine contact a poison control center or emergency room at once. NOTE: This medicine is only for you. Do not share this medicine with others. What if I miss a dose? If you miss a dose, take it as soon as you can. If it is almost time for your next dose, take only that dose. Do not take double or extra doses. What may interact with this medicine? This medicine may interact with the following medications:  clarithromycin  cyclosporine  diltiazem  itraconazole  simvastatin  tacrolimus This list may not describe all possible interactions. Give your health care provider a list of all the medicines, herbs, non-prescription drugs, or dietary supplements you use. Also tell them  if you smoke, drink alcohol, or use illegal drugs. Some items may interact with your medicine. What should I watch for while using this medicine? Visit your health care provider for regular checks on your progress. Check your blood pressure as directed. Ask your health care provider what your blood pressure should be. Also, find out when you should contact him or her. Do not treat yourself for coughs, colds, or pain while you are using this medicine without asking your health care provider for advice. Some medicines may increase your blood pressure. You may get drowsy or dizzy. Do not drive, use machinery, or do anything that needs mental alertness until you know how this medicine affects you. Do not stand up or sit up quickly, especially if you are an older patient. This reduces the risk of dizzy or fainting spells. Alcohol can make you more drowsy and dizzy. Avoid alcoholic drinks. What side effects may I notice from receiving this medicine? Side effects that you should report to your doctor or health care provider as soon as possible:  allergic reactions (skin rash, itching or hives; swelling of the face, lips, or tongue)  heart attack (trouble breathing; pain or tightness in the chest, neck, back or arms; unusually weak or tired)  low blood pressure (dizziness; feeling faint or lightheaded, falls; unusually weak or tired) Side effects that usually do not require medical attention (report these to your doctor or health care provider if they continue or are bothersome):  facial flushing  nausea  palpitations  stomach pain  sudden weight gain  swelling of the ankles, feet, hands This list may not describe all possible side effects. Call your doctor for medical advice about side effects. You may report side effects to FDA at 1-800-FDA-1088. Where should I keep my medicine? Keep out of the reach of children and pets. Store at room temperature between 20 and 25 degrees C (68 and 77 degrees  F). Protect from light and moisture. Keep the container tightly closed. Get rid of any unused medicine after the expiration date. To get rid of medicines that are no longer needed or have expired:  Take the medicine to a medicine take-back program. Check with your pharmacy or law enforcement to find a location.  If you cannot return the medicine, check the label or package insert to see if the medicine should be thrown out in the garbage or flushed down the toilet. If you are not sure, ask your health care provider. If it is safe to put in the trash, empty the medicine out of the container. Mix the medicine with cat litter, dirt, coffee grounds, or other unwanted substance. Seal the mixture in a bag or container. Put it in the trash. NOTE: This sheet is a summary. It may not cover all possible information. If you have questions about this medicine, talk to your doctor, pharmacist, or health care provider.  2021 Elsevier/Gold Standard (2020-01-01 14:59:47)  

## 2020-04-11 NOTE — Progress Notes (Signed)
Established patient visit   Patient: Gina Middleton   DOB: Sep 13, 1935   85 y.o. Female  MRN: [id number] Visit Date: 04/11/2020  Today's healthcare provider: [name], PA-C   Chief Complaint  Patient presents with  . Hypertension   Subjective    HPI  Hypertension, follow-up  BP Readings from Last 3 Encounters:  04/11/20 (!) 160/84  04/10/20 (!) 161/86  03/17/20 (!) 160/96   Wt Readings from Last 3 Encounters:  04/11/20 122 lb (55.3 kg)  04/10/20 120 lb 2.4 oz (54.5 kg)  03/17/20 120 lb (54.4 kg)     She was last seen for hypertension 4 weeks ago.  BP at that visit was 160/96. Management since that visit includes recommended to increase losartan-hctz from 50-12.5mg  to 100-25mg . She was unable to tolerate increase and went back to losartan 50-12.5mg .  She reports excellent compliance with treatment. She is not having side effects.  She is following a Low Sodium diet. She is exercising. She does not smoke.  Use of agents associated with hypertension: none.   Outside blood pressures are not being checked. Symptoms: No chest pain No chest pressure  No palpitations No syncope  No dyspnea No orthopnea  No paroxysmal nocturnal dyspnea No lower extremity edema   Pertinent labs: Lab Results  Component Value Date   CHOL 152 03/17/2020   HDL 41 03/17/2020   LDLCALC 88 03/17/2020   TRIG 130 03/17/2020   CHOLHDL 3.4 03/15/2019   Lab Results  Component Value Date   NA 139 04/10/2020   K 3.7 04/10/2020   CREATININE 0.93 04/10/2020   GFRNONAA >60 04/10/2020   GFRAA 47 (L) 03/17/2020   GLUCOSE 99 04/10/2020     The ASCVD Risk score Mikey Bussing DC Jr., et al., 2013) failed to calculate for the following reasons:   The 2013 ASCVD risk score is only valid for ages 40 to 30   ---------------------------------------------------------------------------------------------------   Patient Active Problem List   Diagnosis Date Noted  . Leg swelling 11/19/2019   . Status post total replacement of right hip 08/25/2019  . Arthritis of hip 04/27/2018  . Renal stone 04/09/2018  . Nephrolithiasis 04/09/2018  . Achilles tendonitis 05/12/2015  . Allergic rhinitis 09/20/2014  . Benign paroxysmal positional nystagmus 09/20/2014  . Blood glucose elevated 09/20/2014  . Neuralgia neuritis, sciatic nerve 09/20/2014  . Vegetarian 09/20/2014  . B12 deficiency 09/20/2014  . Avitaminosis D 09/20/2014  . Other specified health status 09/20/2014  . Hypertension 09/07/2014   Past Medical History:  Diagnosis Date  . Actinic keratosis 10/08/2012   Left mid to distal tricep   . Arthritis   . Basal cell carcinoma 07/21/2018   Left inferior antihelix  . Complication of anesthesia   . History of kidney stones   . Hypertension   . Pneumonia 03/2017  . PONV (postoperative nausea and vomiting) 1980's   n/v with lap choley and n/v after 08-2019 hip surgery   Social History   Tobacco Use  . Smoking status: Never Smoker  . Smokeless tobacco: Never Used  Vaping Use  . Vaping Use: Never used  Substance Use Topics  . Alcohol use: Not Currently  . Drug use: No   Allergies  Allergen Reactions  . Other Anaphylaxis    A muscle relaxer in the 70s but cannot remember name of the medication that caused it  . Hydrocodone-Acetaminophen Other (See Comments)    dizziness  . Lisinopril     Hair thinning  .  Oxycodone-Acetaminophen Nausea And Vomiting  . Iodinated Diagnostic Agents Rash     Medications: Outpatient Medications Prior to Visit  Medication Sig  . acetaminophen (TYLENOL) 325 MG tablet Take 325 mg by mouth every 6 (six) hours as needed.  Marland Kitchen aspirin EC 81 MG tablet Take 81 mg by mouth daily. Swallow whole.  . losartan-hydrochlorothiazide (HYZAAR) 50-12.5 MG tablet Take 1 tablet by mouth daily. (Patient taking differently: Take 1 tablet by mouth at bedtime.)  . meloxicam (MOBIC) 7.5 MG tablet Take 7.5 mg by mouth daily.  . traMADol (ULTRAM) 50 MG tablet  Take 1 tablet (50 mg total) by mouth every 6 (six) hours as needed.   No facility-administered medications prior to visit.    Review of Systems  Constitutional: Negative.   Respiratory: Negative.   Cardiovascular: Negative.   Gastrointestinal: Negative.   Musculoskeletal: Positive for arthralgias.  Neurological: Negative for dizziness, light-headedness and headaches.        Objective    BP (!) 160/84 (BP Location: Left Arm, Patient Position: Sitting, Cuff Size: Large)   Pulse 68   Temp 98.3 F (36.8 C) (Oral)   Wt 122 lb (55.3 kg)   BMI 21.61 kg/m    Physical Exam Vitals reviewed.  Constitutional:      General: She is not in acute distress.    Appearance: Normal appearance. She is well-developed, normal weight and well-nourished. She is not ill-appearing or diaphoretic.  Cardiovascular:     Rate and Rhythm: Normal rate and regular rhythm.     Heart sounds: Normal heart sounds. No murmur heard. No friction rub. No gallop.   Pulmonary:     Effort: Pulmonary effort is normal. No respiratory distress.     Breath sounds: Normal breath sounds. No wheezing or rales.  Musculoskeletal:     Cervical back: Normal range of motion and neck supple.  Neurological:     Mental Status: She is alert.  Psychiatric:        Mood and Affect: Mood normal.        Thought Content: Thought content normal.     No results found for any visits on 04/11/20.  Assessment & Plan     1. Primary hypertension Elevated. Will continue losartan-hctz 50-12.5mg . Add amlodipine 5mg  as below. Call if adverse effects occur. Will f/u in 4 weeks via telephone visit since she will probably not be able to travel much after hip surgery.  - amLODipine (NORVASC) 5 MG tablet; Take 1 tablet (5 mg total) by mouth daily.  Dispense: 90 tablet; Refill: 3   No follow-ups on file.      [name], PA-C, have reviewed all documentation for this visit. The documentation on 04/11/20 for the exam,  diagnosis, procedures, and orders are all accurate and complete.   [name]  [facility] [phone] (phone) [phone] (fax)  [place]

## 2020-04-12 LAB — URINE CULTURE: Special Requests: NORMAL

## 2020-04-16 ENCOUNTER — Encounter: Payer: Self-pay | Admitting: Orthopedic Surgery

## 2020-04-16 NOTE — H&P (Signed)
ORTHOPAEDIC HISTORY & PHYSICAL Gwenlyn Fudge, [place] - 04/13/2020 4:00 PM EST Formatting of this note is different from the original. [facility] Chief Complaint:   Chief Complaint  Patient presents with  . Hip Pain  H & P LEFT HIP   History of Present Illness:   Gina Middleton is a 85 y.o. female that presents to clinic today for her preoperative history and evaluation. Patient presents unaccompanied. The patient is scheduled to undergo a left total hip arthroplasty on 04/19/20 by Dr. Marry Guan. Her pain began around 2 years ago. The pain is located in the left hip, buttocks, and left groin. She describes her pain as worse with weightbearing. She reports associated difficulty removing and putting on her socks. She denies associated numbness or tingling.   The patient's symptoms have progressed to the point that they decrease her quality of life. The patient has previously undergone conservative treatment including NSAIDS and activity modification without adequate control of her symptoms.  Patient states she had significant vomiting after she woke up from surgery last time and asks if there is anything that can be done to prevent that this time.   Past Medical, Surgical, Family, Social History, Allergies, Medications:   Past Medical History:  Past Medical History:  Diagnosis Date  . Hypertension  . Osteoarthritis   Past Surgical History:  Past Surgical History:  Procedure Laterality Date  . APPENDECTOMY  . BACK SURGERY  . CHOLECYSTECTOMY  . Right carpal tunnel release 12/22/2019  Dr Marry Guan  . Right total hip arthroplasty 08/25/2019  Dr Marry Guan   Current Medications:  Current Outpatient Medications  Medication Sig Dispense Refill  . acetaminophen (TYLENOL) 325 MG tablet Take 325 mg by mouth every 6 (six) hours as needed  . amLODIPine (NORVASC) 5 MG tablet 5 mg once daily  . losartan-hydrochlorothiazide (HYZAAR) 50-12.5 mg tablet  Take 1 tablet by mouth once daily   No current facility-administered medications for this visit.   Allergies:  Allergies  Allergen Reactions  . Iodinated Contrast Media Anaphylaxis  . Hydrocodone-Acetaminophen Dizziness  . Oxycodone-Acetaminophen Vomiting   Social History:  Social History   Socioeconomic History  . Marital status: Married  Spouse name: Kyung Rudd  . Number of children: 0  . Years of education: 35  . Highest education level: Master's degree (e.g., MA, MS, MEng, MEd, MSW, MBA)  Occupational History  . Occupation: Part-time- Scientist, research (life sciences)  Tobacco Use  . Smoking status: Never Smoker  . Smokeless tobacco: Never Used  Substance and Sexual Activity  . Alcohol use: No  . Drug use: No  . Sexual activity: Defer  Other Topics Concern  . Not on file  Social History Narrative  . Not on file   Social Determinants of Health   Financial Resource Strain: Not on file  Food Insecurity: Not on file  Transportation Needs: Not on file  Physical Activity: Not on file  Stress: Not on file  Social Connections: Not on file  Housing Stability: Not on file   Family History:  Family History  Problem Relation Age of Onset  . Osteoarthritis Mother  . Heart disease Father  . Diabetes type II Sister   Review of Systems:   A 10+ ROS was performed, reviewed, and the pertinent orthopaedic findings are documented in the HPI.   Physical Examination:   BP (!) 160/100 (BP Location: Left upper arm, Patient Position: Sitting, BP Cuff Size: Adult)  Ht 160 cm (5\' 3" )  Wt 55.2  kg (121 lb 9.6 oz)  BMI 21.54 kg/m   Patient is a well-developed, well-nourished female in no acute distress. Patient has normal mood and affect. Patient is alert and oriented to person, place, and time.   HEENT: Atraumatic, normocephalic. Pupils equal and reactive to light. Extraocular motion intact. Noninjected sclera.  Cardiovascular: Regular rate and rhythm, with no murmurs, rubs, or gallops. Distal  pulses palpable. No bruits.  Respiratory: Lungs clear to auscultation bilaterally.   Left Hip: Pelvic tilt: Positive Limb lengths: The left lower extremity is shorter than the right  Soft tissue swelling: Negative Erythema: Negative Crepitance: Positive Tenderness: Greater trochanter is nontender to palpation. Severe pain is elicited by axial compression or extremes of rotation. Atrophy: No atrophy. Fair hip flexor and abductor strength. Range of Motion: EXT/FLEX: 0/0/90 ADD/ABD: 10/0/10 IR/ER: 5/0/20  Sensation is intact to light touch over the saphenous, lateral cutaneous, superficial fibular, and deep fibular nerve distributions.  Tests Performed/Reviewed:  X-rays  AP view of the pelvis and AP and lateral views of the left hip were obtained. Images reveal right total hip arthroplasty without signs of loosening or failure. No periprosthetic lucency or periprosthetic fracture noted. Left hip reveals complete loss of femoroacetabular joint space with bone-on-bone contact, severe osteophyte formation, and subchondral sclerosis. No fractures noted.   Impression:   ICD-10-CM  1. Primary osteoarthritis of left hip M16.12   Plan:   The patient has end-stage degenerative changes of the left hip. It was explained to the patient that the condition is progressive in nature. Having failed conservative treatment, the patient has elected to proceed with a total joint arthroplasty. The patient will undergo a total joint arthroplasty with Dr. Marry Guan. The risks of surgery, including blood clot and infection, were discussed with the patient. Measures to reduce these risks, including the use of anticoagulation, perioperative antibiotics, and early ambulation were discussed. The importance of postoperative physical therapy was discussed with the patient. The patient elects to proceed with surgery. The patient is instructed to stop all blood thinners prior to surgery. The patient is instructed to call the  hospital the day before surgery to learn of the proper arrival time.   Contact our office with any questions or concerns. Follow up as indicated, or sooner should any new problems arise, if conditions worsen, or if they are otherwise concerned.   Gwenlyn Fudge, PA [facility] [address] [place], [place] [address] Phone: [phone]  This note was generated in part with voice recognition software and I apologize for any typographical errors that were not detected and corrected.   Electronically signed by Gwenlyn Fudge, PA at 04/16/2020 3:20 PM EST

## 2020-04-17 ENCOUNTER — Other Ambulatory Visit
Admission: RE | Admit: 2020-04-17 | Discharge: 2020-04-17 | Disposition: A | Discharge status: Home / Self Care | Payer: Medicare PPO | Source: Ambulatory Visit | Attending: Orthopedic Surgery | Admitting: Orthopedic Surgery

## 2020-04-17 ENCOUNTER — Other Ambulatory Visit: Payer: Self-pay

## 2020-04-17 DIAGNOSIS — Z20822 Contact with and (suspected) exposure to covid-19: Secondary | ICD-10-CM | POA: Insufficient documentation

## 2020-04-17 DIAGNOSIS — Z01812 Encounter for preprocedural laboratory examination: Secondary | ICD-10-CM | POA: Insufficient documentation

## 2020-04-18 LAB — SARS CORONAVIRUS 2 (TAT 6-24 HRS): SARS Coronavirus 2: NEGATIVE

## 2020-04-19 ENCOUNTER — Ambulatory Visit: Payer: Medicare PPO | Admitting: Anesthesiology

## 2020-04-19 ENCOUNTER — Other Ambulatory Visit: Payer: Self-pay

## 2020-04-19 ENCOUNTER — Observation Stay
Admission: RE | Admit: 2020-04-19 | Discharge: 2020-04-21 | Disposition: A | Discharge status: Home / Self Care | Payer: Medicare PPO | Attending: Orthopedic Surgery | Admitting: Orthopedic Surgery

## 2020-04-19 ENCOUNTER — Encounter: Payer: Self-pay | Admitting: Orthopedic Surgery

## 2020-04-19 ENCOUNTER — Observation Stay: Payer: Medicare PPO

## 2020-04-19 ENCOUNTER — Encounter
Admission: RE | Disposition: A | Discharge status: Home / Self Care | Payer: Self-pay | Source: Home / Self Care | Attending: Orthopedic Surgery

## 2020-04-19 DIAGNOSIS — Z85828 Personal history of other malignant neoplasm of skin: Secondary | ICD-10-CM | POA: Insufficient documentation

## 2020-04-19 DIAGNOSIS — Z79899 Other long term (current) drug therapy: Secondary | ICD-10-CM | POA: Diagnosis not present

## 2020-04-19 DIAGNOSIS — I10 Essential (primary) hypertension: Secondary | ICD-10-CM | POA: Insufficient documentation

## 2020-04-19 DIAGNOSIS — M1612 Unilateral primary osteoarthritis, left hip: Secondary | ICD-10-CM | POA: Diagnosis not present

## 2020-04-19 DIAGNOSIS — Z96659 Presence of unspecified artificial knee joint: Secondary | ICD-10-CM

## 2020-04-19 DIAGNOSIS — Z96641 Presence of right artificial hip joint: Secondary | ICD-10-CM | POA: Insufficient documentation

## 2020-04-19 DIAGNOSIS — Z96649 Presence of unspecified artificial hip joint: Secondary | ICD-10-CM

## 2020-04-19 DIAGNOSIS — Z96642 Presence of left artificial hip joint: Secondary | ICD-10-CM

## 2020-04-19 DIAGNOSIS — M25552 Pain in left hip: Secondary | ICD-10-CM | POA: Diagnosis present

## 2020-04-19 HISTORY — PX: TOTAL HIP ARTHROPLASTY: SHX124

## 2020-04-19 SURGERY — ARTHROPLASTY, HIP, TOTAL,POSTERIOR APPROACH
Anesthesia: Spinal | Site: Hip | Laterality: Left

## 2020-04-19 MED ORDER — MENTHOL 3 MG MT LOZG
1.0000 | LOZENGE | OROMUCOSAL | Status: DC | PRN
  Filled 2020-04-19: qty 9

## 2020-04-19 MED ORDER — LOSARTAN POTASSIUM 50 MG PO TABS
50.0000 mg | ORAL_TABLET | Freq: Every day | ORAL | Status: DC
  Administered 2020-04-19 – 2020-04-20 (×2): 50 mg via ORAL
  Filled 2020-04-19 (×3): qty 1

## 2020-04-19 MED ORDER — CELECOXIB 200 MG PO CAPS: 400.0000 mg | ORAL_CAPSULE | Freq: Once | ORAL | Status: AC

## 2020-04-19 MED ORDER — CEFAZOLIN SODIUM-DEXTROSE 2-4 GM/100ML-% IV SOLN
INTRAVENOUS | Status: AC
  Administered 2020-04-19: 2 g via INTRAVENOUS
  Filled 2020-04-19: qty 100

## 2020-04-19 MED ORDER — SENNOSIDES-DOCUSATE SODIUM 8.6-50 MG PO TABS
1.0000 | ORAL_TABLET | Freq: Two times a day (BID) | ORAL | Status: DC
  Administered 2020-04-19 (×2): 1 via ORAL
  Filled 2020-04-19 (×4): qty 1

## 2020-04-19 MED ORDER — BISACODYL 10 MG RE SUPP
10.0000 mg | Freq: Every day | RECTAL | Status: DC | PRN
  Filled 2020-04-19: qty 1

## 2020-04-19 MED ORDER — ACETAMINOPHEN 10 MG/ML IV SOLN
INTRAVENOUS | Status: AC
  Filled 2020-04-19: qty 100

## 2020-04-19 MED ORDER — CEFAZOLIN SODIUM-DEXTROSE 2-4 GM/100ML-% IV SOLN
2.0000 g | INTRAVENOUS | Status: AC
  Administered 2020-04-19: 2 g via INTRAVENOUS

## 2020-04-19 MED ORDER — ONDANSETRON HCL 4 MG/2ML IJ SOLN: 4.0000 mg | Freq: Once | INTRAMUSCULAR | Status: DC | PRN

## 2020-04-19 MED ORDER — FENTANYL CITRATE (PF) 100 MCG/2ML IJ SOLN
INTRAMUSCULAR | Status: AC
  Filled 2020-04-19: qty 2

## 2020-04-19 MED ORDER — METOCLOPRAMIDE HCL 10 MG PO TABS: 10.0000 mg | ORAL_TABLET | Freq: Three times a day (TID) | ORAL | Status: DC

## 2020-04-19 MED ORDER — ALUM & MAG HYDROXIDE-SIMETH 200-200-20 MG/5ML PO SUSP: 30.0000 mL | ORAL | Status: DC | PRN

## 2020-04-19 MED ORDER — CHLORHEXIDINE GLUCONATE 4 % EX LIQD
60.0000 mL | Freq: Once | CUTANEOUS | Status: AC
  Administered 2020-04-19: 4 via TOPICAL

## 2020-04-19 MED ORDER — LACTATED RINGERS IV SOLN: INTRAVENOUS | Status: DC

## 2020-04-19 MED ORDER — DIPHENHYDRAMINE HCL 12.5 MG/5ML PO ELIX
12.5000 mg | ORAL_SOLUTION | ORAL | Status: DC | PRN
Start: 2020-04-19 — End: 2020-04-21
  Filled 2020-04-19: qty 10

## 2020-04-19 MED ORDER — SODIUM CHLORIDE 0.9 % IV SOLN
150.0000 mg | Freq: Once | INTRAVENOUS | Status: DC
  Filled 2020-04-19: qty 5

## 2020-04-19 MED ORDER — ACETAMINOPHEN 10 MG/ML IV SOLN: 1000.0000 mg | Freq: Once | INTRAVENOUS | Status: DC | PRN

## 2020-04-19 MED ORDER — PANTOPRAZOLE SODIUM 40 MG PO TBEC
40.0000 mg | DELAYED_RELEASE_TABLET | Freq: Two times a day (BID) | ORAL | Status: DC
  Administered 2020-04-19 – 2020-04-21 (×4): 40 mg via ORAL
  Filled 2020-04-19 (×6): qty 1

## 2020-04-19 MED ORDER — NEOMYCIN-POLYMYXIN B GU 40-200000 IR SOLN
Status: DC | PRN
  Administered 2020-04-19: 2 mL

## 2020-04-19 MED ORDER — CELECOXIB 200 MG PO CAPS
200.0000 mg | ORAL_CAPSULE | Freq: Two times a day (BID) | ORAL | Status: DC
  Administered 2020-04-21: 200 mg via ORAL

## 2020-04-19 MED ORDER — PROPOFOL 500 MG/50ML IV EMUL
INTRAVENOUS | Status: AC
  Filled 2020-04-19: qty 50

## 2020-04-19 MED ORDER — CEFAZOLIN SODIUM-DEXTROSE 2-4 GM/100ML-% IV SOLN: 2.0000 g | Freq: Four times a day (QID) | INTRAVENOUS | Status: AC

## 2020-04-19 MED ORDER — FENTANYL CITRATE (PF) 100 MCG/2ML IJ SOLN: 25.0000 ug | INTRAMUSCULAR | Status: DC | PRN

## 2020-04-19 MED ORDER — ONDANSETRON HCL 4 MG PO TABS: 4.0000 mg | ORAL_TABLET | Freq: Four times a day (QID) | ORAL | Status: DC | PRN

## 2020-04-19 MED ORDER — SODIUM CHLORIDE 0.9 % IV BOLUS
250.0000 mL | Freq: Once | INTRAVENOUS | Status: AC
  Administered 2020-04-19: 250 mL via INTRAVENOUS

## 2020-04-19 MED ORDER — FAMOTIDINE 20 MG PO TABS
ORAL_TABLET | ORAL | Status: AC
  Administered 2020-04-19: 20 mg
  Filled 2020-04-19: qty 1

## 2020-04-19 MED ORDER — HYDROMORPHONE HCL 1 MG/ML IJ SOLN: 0.5000 mg | INTRAMUSCULAR | Status: DC | PRN

## 2020-04-19 MED ORDER — FERROUS SULFATE 325 (65 FE) MG PO TABS
325.0000 mg | ORAL_TABLET | Freq: Two times a day (BID) | ORAL | Status: DC
  Administered 2020-04-19 – 2020-04-21 (×4): 325 mg via ORAL
  Filled 2020-04-19 (×5): qty 1

## 2020-04-19 MED ORDER — LOSARTAN POTASSIUM-HCTZ 50-12.5 MG PO TABS: 1.0000 | ORAL_TABLET | Freq: Every day | ORAL | Status: DC

## 2020-04-19 MED ORDER — PHENOL 1.4 % MT LIQD
1.0000 | OROMUCOSAL | Status: DC | PRN
  Filled 2020-04-19: qty 177

## 2020-04-19 MED ORDER — CELECOXIB 200 MG PO CAPS
ORAL_CAPSULE | ORAL | Status: AC
  Administered 2020-04-19: 200 mg via ORAL
  Filled 2020-04-19: qty 1

## 2020-04-19 MED ORDER — SODIUM CHLORIDE 0.9 % IV SOLN
INTRAVENOUS | Status: DC | PRN
  Administered 2020-04-19: 15 ug/min via INTRAVENOUS

## 2020-04-19 MED ORDER — ENOXAPARIN SODIUM 40 MG/0.4ML ~~LOC~~ SOLN: 40.0000 mg | SUBCUTANEOUS | Status: DC

## 2020-04-19 MED ORDER — DEXAMETHASONE SODIUM PHOSPHATE 10 MG/ML IJ SOLN: 8.0000 mg | Freq: Once | INTRAMUSCULAR | Status: AC

## 2020-04-19 MED ORDER — GABAPENTIN 300 MG PO CAPS
ORAL_CAPSULE | ORAL | Status: AC
  Administered 2020-04-19: 300 mg via ORAL
  Filled 2020-04-19: qty 1

## 2020-04-19 MED ORDER — FAMOTIDINE 20 MG PO TABS: 20.0000 mg | ORAL_TABLET | Freq: Once | ORAL | Status: DC

## 2020-04-19 MED ORDER — ORAL CARE MOUTH RINSE: 15.0000 mL | Freq: Once | OROMUCOSAL | Status: AC

## 2020-04-19 MED ORDER — ACETAMINOPHEN 10 MG/ML IV SOLN
INTRAVENOUS | Status: DC | PRN
  Administered 2020-04-19: 1000 mg via INTRAVENOUS

## 2020-04-19 MED ORDER — MAGNESIUM HYDROXIDE 400 MG/5ML PO SUSP
ORAL | Status: AC
  Administered 2020-04-19: 30 mL via ORAL
  Filled 2020-04-19: qty 30

## 2020-04-19 MED ORDER — ACETAMINOPHEN 10 MG/ML IV SOLN
INTRAVENOUS | Status: AC
  Administered 2020-04-19: 1000 mg via INTRAVENOUS
  Filled 2020-04-19: qty 100

## 2020-04-19 MED ORDER — ACETAMINOPHEN 10 MG/ML IV SOLN
1000.0000 mg | Freq: Four times a day (QID) | INTRAVENOUS | Status: AC
  Administered 2020-04-20: 1000 mg via INTRAVENOUS

## 2020-04-19 MED ORDER — CHLORHEXIDINE GLUCONATE 0.12 % MT SOLN: 15.0000 mL | Freq: Once | OROMUCOSAL | Status: AC

## 2020-04-19 MED ORDER — HYDROCHLOROTHIAZIDE 12.5 MG PO CAPS
12.5000 mg | ORAL_CAPSULE | Freq: Every day | ORAL | Status: DC
  Administered 2020-04-19 – 2020-04-20 (×2): 12.5 mg via ORAL
  Filled 2020-04-19 (×3): qty 1

## 2020-04-19 MED ORDER — TRANEXAMIC ACID-NACL 1000-0.7 MG/100ML-% IV SOLN: 1000.0000 mg | Freq: Once | INTRAVENOUS | Status: AC

## 2020-04-19 MED ORDER — CHLORHEXIDINE GLUCONATE 0.12 % MT SOLN
OROMUCOSAL | Status: AC
  Administered 2020-04-19: 15 mL via OROMUCOSAL
  Filled 2020-04-19: qty 15

## 2020-04-19 MED ORDER — APREPITANT 40 MG PO CAPS: 40.0000 mg | ORAL_CAPSULE | Freq: Every day | ORAL | Status: DC

## 2020-04-19 MED ORDER — TRANEXAMIC ACID-NACL 1000-0.7 MG/100ML-% IV SOLN
1000.0000 mg | INTRAVENOUS | Status: AC
  Administered 2020-04-19: 1000 mg via INTRAVENOUS

## 2020-04-19 MED ORDER — PROPOFOL 500 MG/50ML IV EMUL
INTRAVENOUS | Status: DC | PRN
  Administered 2020-04-19: 100 ug/kg/min via INTRAVENOUS

## 2020-04-19 MED ORDER — APREPITANT 40 MG PO CAPS
ORAL_CAPSULE | ORAL | Status: AC
  Administered 2020-04-19: 40 mg via ORAL
  Filled 2020-04-19: qty 1

## 2020-04-19 MED ORDER — MAGNESIUM HYDROXIDE 400 MG/5ML PO SUSP: 30.0000 mL | Freq: Every day | ORAL | Status: DC

## 2020-04-19 MED ORDER — NEOMYCIN-POLYMYXIN B GU 40-200000 IR SOLN
Status: AC
  Filled 2020-04-19: qty 2

## 2020-04-19 MED ORDER — DEXAMETHASONE SODIUM PHOSPHATE 10 MG/ML IJ SOLN
INTRAMUSCULAR | Status: AC
  Administered 2020-04-19: 8 mg via INTRAVENOUS
  Filled 2020-04-19: qty 1

## 2020-04-19 MED ORDER — TRANEXAMIC ACID-NACL 1000-0.7 MG/100ML-% IV SOLN
INTRAVENOUS | Status: AC
  Filled 2020-04-19: qty 100

## 2020-04-19 MED ORDER — TRAMADOL HCL 50 MG PO TABS: 50.0000 mg | ORAL_TABLET | ORAL | Status: DC | PRN

## 2020-04-19 MED ORDER — SODIUM CHLORIDE 0.9 % IV SOLN: INTRAVENOUS | Status: DC

## 2020-04-19 MED ORDER — ACETAMINOPHEN 325 MG PO TABS
325.0000 mg | ORAL_TABLET | Freq: Four times a day (QID) | ORAL | Status: DC | PRN
  Administered 2020-04-21: 650 mg via ORAL

## 2020-04-19 MED ORDER — ACETAMINOPHEN 10 MG/ML IV SOLN
INTRAVENOUS | Status: AC
  Administered 2020-04-20: 1000 mg via INTRAVENOUS
  Filled 2020-04-19: qty 100

## 2020-04-19 MED ORDER — PROPOFOL 10 MG/ML IV BOLUS
INTRAVENOUS | Status: DC | PRN
  Administered 2020-04-19: 20 mg via INTRAVENOUS

## 2020-04-19 MED ORDER — METOCLOPRAMIDE HCL 10 MG PO TABS
ORAL_TABLET | ORAL | Status: AC
  Administered 2020-04-19: 10 mg via ORAL
  Filled 2020-04-19: qty 1

## 2020-04-19 MED ORDER — ONDANSETRON HCL 4 MG/2ML IJ SOLN: 4.0000 mg | Freq: Four times a day (QID) | INTRAMUSCULAR | Status: DC | PRN

## 2020-04-19 MED ORDER — TRANEXAMIC ACID-NACL 1000-0.7 MG/100ML-% IV SOLN
INTRAVENOUS | Status: AC
  Administered 2020-04-19: 1000 mg via INTRAVENOUS
  Filled 2020-04-19: qty 100

## 2020-04-19 MED ORDER — FLEET ENEMA 7-19 GM/118ML RE ENEM: 1.0000 | ENEMA | Freq: Once | RECTAL | Status: DC | PRN

## 2020-04-19 MED ORDER — CELECOXIB 200 MG PO CAPS
ORAL_CAPSULE | ORAL | Status: AC
  Administered 2020-04-19: 400 mg via ORAL
  Filled 2020-04-19: qty 2

## 2020-04-19 MED ORDER — CEFAZOLIN SODIUM-DEXTROSE 2-4 GM/100ML-% IV SOLN
INTRAVENOUS | Status: AC
  Filled 2020-04-19: qty 100

## 2020-04-19 MED ORDER — AMLODIPINE BESYLATE 5 MG PO TABS
5.0000 mg | ORAL_TABLET | Freq: Every day | ORAL | Status: DC
  Administered 2020-04-19 – 2020-04-21 (×3): 5 mg via ORAL
  Filled 2020-04-19 (×4): qty 1

## 2020-04-19 MED ORDER — GABAPENTIN 300 MG PO CAPS: 300.0000 mg | ORAL_CAPSULE | Freq: Once | ORAL | Status: AC

## 2020-04-19 SURGICAL SUPPLY — 62 items
BLADE DRUM FLTD (BLADE) ×2 IMPLANT
BLADE SAW 90X25X1.19 OSCILLAT (BLADE) ×2 IMPLANT
CANISTER SUCT 1200ML W/VALVE (MISCELLANEOUS) ×2 IMPLANT
CANISTER SUCT 3000ML PPV (MISCELLANEOUS) ×4 IMPLANT
CARTRIDGE OIL MAESTRO DRILL (MISCELLANEOUS) ×1 IMPLANT
COVER WAND RF STERILE (DRAPES) ×2 IMPLANT
CUP ACETBLR 52 OD 100 SERIES (Hips) ×1 IMPLANT
DIFFUSER DRILL AIR PNEUMATIC (MISCELLANEOUS) ×2 IMPLANT
DRAPE 3/4 80X56 (DRAPES) ×2 IMPLANT
DRAPE INCISE IOBAN 66X60 STRL (DRAPES) ×2 IMPLANT
DRSG DERMACEA 8X12 NADH (GAUZE/BANDAGES/DRESSINGS) ×2 IMPLANT
DRSG MEPILEX SACRM 8.7X9.8 (GAUZE/BANDAGES/DRESSINGS) ×2 IMPLANT
DRSG OPSITE POSTOP 4X12 (GAUZE/BANDAGES/DRESSINGS) ×5 IMPLANT
DRSG OPSITE POSTOP 4X14 (GAUZE/BANDAGES/DRESSINGS) IMPLANT
DRSG TEGADERM 4X4.75 (GAUZE/BANDAGES/DRESSINGS) ×2 IMPLANT
DURAPREP 26ML APPLICATOR (WOUND CARE) ×2 IMPLANT
ELECT REM PT RETURN 9FT ADLT (ELECTROSURGICAL) ×2
ELECTRODE REM PT RTRN 9FT ADLT (ELECTROSURGICAL) ×1 IMPLANT
GLOVE BIOGEL PI IND STRL 7.5 (GLOVE) ×1 IMPLANT
GLOVE BIOGEL PI INDICATOR 7.5 (GLOVE) ×1
GLOVE INDICATOR 8.0 STRL GRN (GLOVE) ×2 IMPLANT
GLOVE SURG ENC MOIS LTX SZ7.5 (GLOVE) ×4 IMPLANT
GLOVE SURG ENC TEXT LTX SZ7.5 (GLOVE) ×4 IMPLANT
GOWN STRL REUS W/ TWL LRG LVL3 (GOWN DISPOSABLE) ×2 IMPLANT
GOWN STRL REUS W/ TWL XL LVL3 (GOWN DISPOSABLE) ×1 IMPLANT
GOWN STRL REUS W/TWL LRG LVL3 (GOWN DISPOSABLE) ×2
GOWN STRL REUS W/TWL XL LVL3 (GOWN DISPOSABLE) ×1
HEAD M SROM 36MM PLUS 1.5 (Hips) IMPLANT
HEMOVAC 400CC 10FR (MISCELLANEOUS) ×2 IMPLANT
HOLDER FOLEY CATH W/STRAP (MISCELLANEOUS) ×2 IMPLANT
HOOD PEEL AWAY FLYTE STAYCOOL (MISCELLANEOUS) ×4 IMPLANT
IRRIGATION SURGIPHOR STRL (IV SOLUTION) ×2 IMPLANT
KIT PEG BOARD PINK (KITS) ×2 IMPLANT
KIT TURNOVER KIT A (KITS) ×2 IMPLANT
LINER ACETAB NEUTRAL 36ID 520D (Liner) ×1 IMPLANT
MANIFOLD NEPTUNE II (INSTRUMENTS) ×4 IMPLANT
NDL SAFETY ECLIPSE 18X1.5 (NEEDLE) ×1 IMPLANT
NEEDLE HYPO 18GX1.5 SHARP (NEEDLE) ×1
NS IRRIG 500ML POUR BTL (IV SOLUTION) ×2 IMPLANT
OIL CARTRIDGE MAESTRO DRILL (MISCELLANEOUS) ×2
PACK HIP PROSTHESIS (MISCELLANEOUS) ×2 IMPLANT
PENCIL SMOKE EVACUATOR (MISCELLANEOUS) ×2 IMPLANT
PULSAVAC PLUS IRRIG FAN TIP (DISPOSABLE) ×2
SOL .9 NS 3000ML IRR  AL (IV SOLUTION) ×1
SOL .9 NS 3000ML IRR UROMATIC (IV SOLUTION) ×1 IMPLANT
SOL PREP PVP 2OZ (MISCELLANEOUS) ×2
SOLUTION PREP PVP 2OZ (MISCELLANEOUS) ×1 IMPLANT
SPONGE DRAIN TRACH 4X4 STRL 2S (GAUZE/BANDAGES/DRESSINGS) ×2 IMPLANT
SROM M HEAD 36MM PLUS 1.5 (Hips) ×2 IMPLANT
STAPLER SKIN PROX 35W (STAPLE) ×2 IMPLANT
STEM FEM CMNTLSS SM AML 13.5 (Hips) ×1 IMPLANT
SUT ETHIBOND #5 BRAIDED 30INL (SUTURE) ×2 IMPLANT
SUT VIC AB 0 CT1 36 (SUTURE) ×2 IMPLANT
SUT VIC AB 1 CT1 36 (SUTURE) ×4 IMPLANT
SUT VIC AB 2-0 CT1 27 (SUTURE) ×1
SUT VIC AB 2-0 CT1 TAPERPNT 27 (SUTURE) ×1 IMPLANT
SYR 20ML LL LF (SYRINGE) ×2 IMPLANT
TAPE CLOTH 3X10 WHT NS LF (GAUZE/BANDAGES/DRESSINGS) ×2 IMPLANT
TAPE TRANSPORE STRL 2 31045 (GAUZE/BANDAGES/DRESSINGS) ×2 IMPLANT
TIP FAN IRRIG PULSAVAC PLUS (DISPOSABLE) ×1 IMPLANT
TOWEL OR 17X26 4PK STRL BLUE (TOWEL DISPOSABLE) ×2 IMPLANT
TRAY FOLEY MTR SLVR 16FR STAT (SET/KITS/TRAYS/PACK) ×2 IMPLANT

## 2020-04-19 NOTE — Anesthesia Preprocedure Evaluation (Signed)
Anesthesia Evaluation  Patient identified by MRN, date of birth, ID band Patient awake  General Assessment Comment:Patient had rescue antiemetic postop after prior hip. Will administer aprepitant preop  Reviewed: Allergy & Precautions, H&P , NPO status , Patient's Chart, lab work & pertinent test results  History of Anesthesia Complications (+) PONV and history of anesthetic complications  Airway Mallampati: II  TM Distance: >3 FB Neck ROM: Full    Dental  (+) Chipped   Pulmonary neg pulmonary ROS, neg sleep apnea, neg COPD, Patient abstained from smoking.Not current smoker,    breath sounds clear to auscultation       Cardiovascular Exercise Tolerance: Good METShypertension, Pt. on medications (-) angina(-) CAD, (-) Past MI and (-) Cardiac Stents (-) dysrhythmias  Rhythm:regular Rate:Normal     Neuro/Psych negative neurological ROS  negative psych ROS   GI/Hepatic negative GI ROS, Neg liver ROS, neg GERD  ,  Endo/Other  negative endocrine ROSneg diabetes  Renal/GU negative Renal ROS     Musculoskeletal   Abdominal   Peds  Hematology negative hematology ROS (+)   Anesthesia Other Findings Past Medical History: 10/08/2012: Actinic keratosis     Comment:  Left mid to distal tricep  No date: Arthritis 07/21/2018: Basal cell carcinoma     Comment:  Left inferior antihelix No date: Complication of anesthesia No date: History of kidney stones No date: Hypertension 03/2017: Pneumonia 1980's: PONV (postoperative nausea and vomiting)     Comment:  n/v with lap choley and n/v after 08-2019 hip surgery  Past Surgical History: No date: APPENDECTOMY 1969: BACK SURGERY No date: CHOLECYSTECTOMY 04/09/2018: CYSTOSCOPY W/ RETROGRADES; Left     Comment:  Procedure: CYSTOSCOPY WITH RETROGRADE PYELOGRAM;                Surgeon: Abbie Sons, MD;  Location: [facility];                Service: Urology;  Laterality:  Left; 04/09/2018: CYSTOSCOPY WITH STENT PLACEMENT; Left     Comment:  Procedure: CYSTOSCOPY WITH STENT PLACEMENT-LEFT;                Surgeon: Abbie Sons, MD;  Location: [facility];                Service: Urology;  Laterality: Left; 04/09/2018: CYSTOSCOPY WITH URETEROSCOPY; Left     Comment:  Procedure: POSSIBLE URETEROSCOPY-LEFT;  Surgeon:               Abbie Sons, MD;  Location: [facility];  Service:               Urology;  Laterality: Left; 08/25/2019: TOTAL HIP ARTHROPLASTY; Right     Comment:  Procedure: TOTAL HIP ARTHROPLASTY;  Surgeon: Dereck Leep, MD;  Location: [facility];  Service: Orthopedics;               Laterality: Right;     Reproductive/Obstetrics negative OB ROS                            Anesthesia Physical  Anesthesia Plan  ASA: II  Anesthesia Plan: Spinal   Post-op Pain Management:    Induction: Intravenous  PONV Risk Score and Plan: 3 and Dexamethasone, Ondansetron, Treatment may vary due to age or medical condition, Propofol infusion, Aprepitant and TIVA  Airway Management Planned: Natural  Airway  Additional Equipment: None  Intra-op Plan:   Post-operative Plan:   Informed Consent: I have reviewed the patients History and Physical, chart, labs and discussed the procedure including the risks, benefits and alternatives for the proposed anesthesia with the patient or authorized representative who has indicated his/her understanding and acceptance.     Dental Advisory Given  Plan Discussed with: Anesthesiologist, CRNA and Surgeon  Anesthesia Plan Comments: (Discussed R/B/A of neuraxial anesthesia technique with patient: - rare risks of spinal/epidural hematoma, nerve damage, infection - Risk of PDPH - Risk of nausea and vomiting - Risk of conversion to general anesthesia and its associated risks, including sore throat, damage to lips/teeth/oropharynx, and rare risks such as cardiac and respiratory  events.  Patient voiced understanding.)       Anesthesia Quick Evaluation

## 2020-04-19 NOTE — Op Note (Signed)
OPERATIVE NOTE  DATE OF SURGERY:  04/19/2020  PATIENT NAME:  Gina Middleton   DOB: [birth year]  MRN: [id number]  PRE-OPERATIVE DIAGNOSIS: Degenerative arthrosis of the left hip, primary  POST-OPERATIVE DIAGNOSIS:  Same  PROCEDURE:  Left total hip arthroplasty  SURGEON:  Marciano Sequin. M.D.  ASSISTANT: Cassell Smiles, PA-C (present and scrubbed throughout the case, critical for assistance with exposure, retraction, instrumentation, and closure)  ANESTHESIA: spinal  ESTIMATED BLOOD LOSS: 75 mL  FLUIDS REPLACED: 1100 mL of crystalloid  DRAINS: 2 medium Hemovac  IMPLANTS UTILIZED: DePuy 13.5 mm small stature AML femoral stem, 52 mm OD Pinnacle 100 acetabular component, neutral Pinnacle Altrx polyethylene insert, and a 36 mm M-SPEC +1.5 mm hip ball  INDICATIONS FOR SURGERY: Gina Middleton is a 85 y.o. year old female with a long history of progressive hip and groin  pain. X-rays demonstrated severe degenerative changes. The patient had not seen any significant improvement despite conservative nonsurgical intervention. After discussion of the risks and benefits of surgical intervention, the patient expressed understanding of the risks benefits and agree with plans for total hip arthroplasty.   The risks, benefits, and alternatives were discussed at length including but not limited to the risks of infection, bleeding, nerve injury, stiffness, blood clots, the need for revision surgery, limb length inequality, dislocation, cardiopulmonary complications, among others, and they were willing to proceed.  PROCEDURE IN DETAIL: The patient was brought into the operating room and, after adequate spinal anesthesia was achieved, the patient was placed in a right lateral decubitus position. Axillary roll was placed and all bony prominences were well-padded. The patient's left hip was cleaned and prepped with alcohol and DuraPrep and draped in the usual sterile fashion. A "timeout" was performed  as per usual protocol. A lateral curvilinear incision was made gently curving towards the posterior superior iliac spine. The IT band was incised in line with the skin incision and the fibers of the gluteus maximus were split in line. The piriformis tendon was identified, skeletonized, and incised at its insertion to the proximal femur and reflected posteriorly. A T type posterior capsulotomy was performed. Prior to dislocation of the femoral head, a threaded Steinmann pin was inserted through a separate stab incision into the pelvis superior to the acetabulum and bent in the form of a stylus so as to assess limb length and hip offset throughout the procedure. The femoral head was then dislocated posteriorly. Inspection of the femoral head demonstrated severe degenerative changes with full-thickness loss of articular cartilage. The femoral neck cut was performed using an oscillating saw. The anterior capsule was elevated off of the femoral neck using a periosteal elevator. Attention was then directed to the acetabulum. The remnant of the labrum was excised using electrocautery. Inspection of the acetabulum also demonstrated significant degenerative changes. The acetabulum was reamed in sequential fashion up to a 51 mm diameter. Good punctate bleeding bone was encountered. A 52 mm Pinnacle 100 acetabular component was positioned and impacted into place. Good scratch fit was appreciated. A neutral polyethylene trial was inserted.  Attention was then directed to the proximal femur. A hole for reaming of the proximal femoral canal was created using a high-speed burr. The femoral canal was reamed in sequential fashion up to a 13 mm diameter. This allowed for approximately 5 cm of scratch fit. Serial broaches were inserted up to a 13.5 mm small stature femoral broach. Calcar region was planed and a trial reduction was performed using a 36 mm hip  ball with a +1.5 mm neck length. Good equalization of limb lengths and hip  offset was appreciated and excellent stability was noted both anteriorly and posteriorly. Trial components were removed. The acetabular shell was irrigated with copious amounts of normal saline with antibiotic solution and suctioned dry. A neutral Pinnacle Altrx polyethylene insert was positioned and impacted into place. Next, a 13.5 mm small stature AML femoral stem was positioned and impacted into place. Excellent scratch fit was appreciated. A trial reduction was again performed with a 36 mm hip ball with a +1.5 mm neck length. Again, good equalization of limb lengths was appreciated and excellent stability appreciated both anteriorly and posteriorly. The hip was then dislocated and the trial hip ball was removed. The Morse taper was cleaned and dried. A 36 mm M-SPEC hip ball with a +1.5 mm neck length was placed on the trunnion and impacted into place. The hip was then reduced and placed through range of motion. Excellent stability was appreciated both anteriorly and posteriorly.  The wound was irrigated with copious amounts of normal saline followed by 500 ml of Surgiphor and suctioned dry. Good hemostasis was appreciated. The posterior capsulotomy was repaired using #5 Ethibond. Piriformis tendon was reapproximated to the undersurface of the gluteus medius tendon using #5 Ethibond. The IT band was reapproximated using interrupted sutures of #1 Vicryl. Subcutaneous tissue was approximated using first #0 Vicryl followed by #2-0 Vicryl. The skin was closed with skin staples.  The patient tolerated the procedure well and was transported to the recovery room in stable condition.   Marciano Sequin., M.D.

## 2020-04-19 NOTE — H&P (Signed)
The patient has been re-examined, and the chart reviewed, and there have been no interval changes to the documented history and physical.    The risks, benefits, and alternatives have been discussed at length. The patient expressed understanding of the risks benefits and agreed with plans for surgical intervention.  Oliver Heitzenrater P. Rex Oesterle, Jr. M.D.    

## 2020-04-19 NOTE — Anesthesia Postprocedure Evaluation (Signed)
Anesthesia Post Note  Patient: Gina Middleton  Procedure(s) Performed: TOTAL HIP ARTHROPLASTY (Left Hip)  Patient location during evaluation: PACU Anesthesia Type: Spinal Level of consciousness: oriented and awake and alert Pain management: pain level controlled Vital Signs Assessment: post-procedure vital signs reviewed and stable Respiratory status: spontaneous breathing, respiratory function stable and patient connected to nasal cannula oxygen Cardiovascular status: blood pressure returned to baseline and stable Postop Assessment: no headache, no backache, no apparent nausea or vomiting and spinal receding Anesthetic complications: no   No complications documented.   Last Vitals:  Vitals:   04/19/20 1230 04/19/20 1234  BP:  118/73  Pulse: 71 68  Resp: 15 18  Temp:    SpO2: 100% 100%    Last Pain:  Vitals:   04/19/20 1234  TempSrc:   PainSc: Asleep                 Arita Miss

## 2020-04-19 NOTE — Evaluation (Signed)
Physical Therapy Evaluation Patient Details Name: Gina Middleton MRN: [id number] DOB: [birth year] Today's Date: 04/19/2020   History of Present Illness  Pt is 85  yo female s/p L THA, posterior approach WBAT. PMH of HTN, renal disease, back surgery, R THA.  Clinical Impression  Pt alert, agreeable to PT, oriented x4. Denied hip pain, just reported "stinging" and did not quantify. Pt reported at baseline she is independent/modI with quad cane for ambulation. Husband available to assist as needed at discharge.  The patient was able to recall 2/3 posterior hip precautions from previous surgery. BP assessed in supine and sitting due to low BP reported per RN, WFLs. Supine to sit with CGA and bed rails, fair sitting balance noted. Pt requested to use BSC, some urination noted during transfer, pt socks/ted hose doffed and pt assisted with cleaning off her LEs and new socks donned. Sit <> Stand with RW and CGA, and she was able to ambulate ~52ft with RW and CGA. Did exhibit antalgic gait on LLE, no LOB noted but pt did reported feeling light headed momentarily. She was also able to perform several therapeutic exercises during session. Up in chair, all needs in reach.  Overall the patient demonstrated deficits (see "PT Problem List") that impede the patient's functional abilities, safety, and mobility and would benefit from skilled PT intervention. Recommendation is HHPT with 24/7 supervision.      Follow Up Recommendations Home health PT;Supervision/Assistance - 24 hour    Equipment Recommendations  None recommended by PT (pt has RW and BSC)    Recommendations for Other Services       Precautions / Restrictions Precautions Precautions: Posterior Hip Precaution Booklet Issued: No Restrictions Weight Bearing Restrictions: Yes LLE Weight Bearing: Weight bearing as tolerated      Mobility  Bed Mobility Overal bed mobility: Needs Assistance Bed Mobility: Supine to Sit     Supine to sit:  Min guard;HOB elevated     General bed mobility comments: use of bed rails    Transfers Overall transfer level: Needs assistance Equipment used: Rolling walker (2 wheeled) Transfers: Sit to/from Stand Sit to Stand: Min guard         General transfer comment: from EOB and BSC cues for hand placement  Ambulation/Gait Ambulation/Gait assistance: Min guard Gait Distance (Feet): 25 Feet Assistive device: Rolling walker (2 wheeled) Gait Pattern/deviations: Antalgic;Decreased step length - left;Decreased stance time - left     General Gait Details: antalgic on LLE  Stairs            Wheelchair Mobility    Modified Rankin (Stroke Patients Only)       Balance Overall balance assessment: Needs assistance Sitting-balance support: Feet supported Sitting balance-Leahy Scale: Good Sitting balance - Comments: pt able to wipe while sitting on BSC with supervision   Standing balance support: Bilateral upper extremity supported Standing balance-Leahy Scale: Fair                               Pertinent Vitals/Pain Pain Assessment:  (reported a stinging, did not quantify)    Home Living Family/patient expects to be discharged to:: Private residence Living Arrangements: Spouse/significant other Available Help at Discharge: Family;Available 24 hours/day Type of Home: House Home Access: Stairs to enter;Ramped entrance     Home Layout: One level Home Equipment: Walker - 2 wheels;Cane - quad;Cane - single point;Bedside commode;Toilet riser Additional Comments: 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; home information reported for the home pt will discharge to    Prior Function Level of Independence: Independent with assistive device(s)         Comments: patient ambulates with quad cane      Hand Dominance        Extremity/Trunk Assessment   Upper Extremity Assessment Upper Extremity Assessment: Overall WFL for tasks assessed     Lower Extremity Assessment Lower Extremity Assessment: RLE deficits/detail;LLE deficits/detail RLE Deficits / Details: able to move against gravity/lift independently LLE Deficits / Details: s/p L THA       Communication   Communication: No difficulties  Cognition Arousal/Alertness: Awake/alert Behavior During Therapy: WFL for tasks assessed/performed Overall Cognitive Status: Within Functional Limits for tasks assessed                                        General Comments      Exercises Total Joint Exercises Ankle Circles/Pumps: AROM;Both;10 reps Quad Sets: AROM;Left;10 reps Gluteal Sets: AROM;Both;15 reps Heel Slides: AAROM;Left;10 reps Other Exercises Other Exercises: Pt requested to use BSC, some urination noted on way to commode. Socks and ted hose doffed, pt cleaned, and new socks donned. Other Exercises: BP assessed in supine and in sitting, WFLs.   Assessment/Plan    PT Assessment Patient needs continued PT services  PT Problem List Decreased strength;Decreased range of motion;Decreased activity tolerance;Decreased balance;Decreased mobility;Decreased knowledge of precautions;Pain;Decreased knowledge of use of DME       PT Treatment Interventions DME instruction;Balance training;Gait training;Neuromuscular re-education;Functional mobility training;Patient/family education;Therapeutic activities;Therapeutic exercise    PT Goals (Current goals can be found in the Care Plan section)  Acute Rehab PT Goals Patient Stated Goal: to go home PT Goal Formulation: With patient Time For Goal Achievement: 05/03/20 Potential to Achieve Goals: Good    Frequency BID   Barriers to discharge        Co-evaluation               AM-PAC PT "6 Clicks" Mobility  Outcome Measure Help needed turning from your back to your side while in a flat bed without using bedrails?: A Little Help needed moving from lying on your back to sitting on the side of a  flat bed without using bedrails?: A Little Help needed moving to and from a bed to a chair (including a wheelchair)?: A Little Help needed standing up from a chair using your arms (e.g., wheelchair or bedside chair)?: A Little Help needed to walk in hospital room?: A Little Help needed climbing 3-5 steps with a railing? : A Little 6 Click Score: 18    End of Session Equipment Utilized During Treatment: Gait belt Activity Tolerance: Patient tolerated treatment well Patient left: in chair;with call bell/phone within reach Nurse Communication: Mobility status PT Visit Diagnosis: Other abnormalities of gait and mobility (R26.89);Pain;Muscle weakness (generalized) (M62.81);Difficulty in walking, not elsewhere classified (R26.2) Pain - Right/Left: Left Pain - part of body: Hip    Time: 4163-8453 PT Time Calculation (min) (ACUTE ONLY): 34 min   Charges:   PT Evaluation $PT Eval Low Complexity: 1 Low PT Treatments $Therapeutic Exercise: 8-22 mins $Therapeutic Activity: 8-22 mins        Lieutenant Diego PT, DPT 4:13 PM,04/19/20

## 2020-04-19 NOTE — Anesthesia Procedure Notes (Signed)
Spinal  Patient location during procedure: OR Staffing Performed: resident/CRNA  Anesthesiologist: Arita Miss, MD Resident/CRNA: Fredderick Phenix, CRNA Preanesthetic Checklist Completed: patient identified, IV checked, site marked, risks and benefits discussed, surgical consent, monitors and equipment checked, pre-op evaluation and timeout performed Spinal Block Patient position: sitting Prep: DuraPrep Patient monitoring: heart rate, cardiac monitor, continuous pulse ox and blood pressure Approach: midline Location: L3-4 Injection technique: single-shot Needle Needle type: Sprotte  Needle gauge: 24 G Needle length: 9 cm Assessment Sensory level: T4

## 2020-04-19 NOTE — Transfer of Care (Signed)
Immediate Anesthesia Transfer of Care Note  Patient: Gina Middleton  Procedure(s) Performed: TOTAL HIP ARTHROPLASTY (Left Hip)  Patient Location: PACU  Anesthesia Type:Spinal  Level of Consciousness: awake and alert   Airway & Oxygen Therapy: Patient Spontanous Breathing and Patient connected to nasal cannula oxygen  Post-op Assessment: Report given to RN and Post -op Vital signs reviewed and stable  Post vital signs: Reviewed and stable  Last Vitals:  Vitals Value Taken Time  BP 100/57 04/19/20 1201  Temp    Pulse 65 04/19/20 1204  Resp 12 04/19/20 1204  SpO2 98 % 04/19/20 1204  Vitals shown include unvalidated device data.  Last Pain:  Vitals:   04/19/20 0716  TempSrc: Tympanic  PainSc: 0-No pain         Complications: No complications documented.

## 2020-04-20 ENCOUNTER — Encounter: Payer: Self-pay | Admitting: Orthopedic Surgery

## 2020-04-20 DIAGNOSIS — M1612 Unilateral primary osteoarthritis, left hip: Secondary | ICD-10-CM | POA: Diagnosis not present

## 2020-04-20 MED ORDER — METOCLOPRAMIDE HCL 10 MG PO TABS
ORAL_TABLET | ORAL | Status: AC
  Administered 2020-04-20: 10 mg via ORAL
  Filled 2020-04-20: qty 1

## 2020-04-20 MED ORDER — SENNA 8.6 MG PO TABS
1.0000 | ORAL_TABLET | Freq: Two times a day (BID) | ORAL | Status: DC
  Administered 2020-04-20 – 2020-04-21 (×3): 8.6 mg via ORAL
  Filled 2020-04-20 (×5): qty 1

## 2020-04-20 MED ORDER — CELECOXIB 200 MG PO CAPS
ORAL_CAPSULE | ORAL | Status: AC
  Administered 2020-04-20: 200 mg via ORAL
  Filled 2020-04-20: qty 1

## 2020-04-20 MED ORDER — ACETAMINOPHEN 10 MG/ML IV SOLN
INTRAVENOUS | Status: AC
  Filled 2020-04-20: qty 100

## 2020-04-20 MED ORDER — MAGNESIUM HYDROXIDE 400 MG/5ML PO SUSP
ORAL | Status: AC
  Administered 2020-04-20: 30 mL via ORAL
  Filled 2020-04-20: qty 30

## 2020-04-20 MED ORDER — ENOXAPARIN SODIUM 40 MG/0.4ML ~~LOC~~ SOLN
SUBCUTANEOUS | Status: AC
  Administered 2020-04-20: 40 mg via SUBCUTANEOUS
  Filled 2020-04-20: qty 0.4

## 2020-04-20 MED ORDER — DOCUSATE SODIUM 100 MG PO CAPS
100.0000 mg | ORAL_CAPSULE | Freq: Two times a day (BID) | ORAL | Status: DC
  Administered 2020-04-20 – 2020-04-21 (×3): 100 mg via ORAL
  Filled 2020-04-20 (×5): qty 1

## 2020-04-20 MED ORDER — TRAMADOL HCL 50 MG PO TABS
ORAL_TABLET | ORAL | Status: AC
  Administered 2020-04-20: 50 mg via ORAL
  Filled 2020-04-20: qty 1

## 2020-04-20 MED ORDER — ACETAMINOPHEN 10 MG/ML IV SOLN
INTRAVENOUS | Status: AC
  Administered 2020-04-20: 1000 mg via INTRAVENOUS
  Filled 2020-04-20: qty 100

## 2020-04-20 NOTE — Progress Notes (Signed)
  Subjective: 1 Day Post-Op Procedure(s) (LRB): TOTAL HIP ARTHROPLASTY (Left) Patient reports pain as well-controlled.   Patient is well, but has had some minor complaints of "wooziness" after taking her tramadol this AM Plan is to go Home after hospital stay. Negative for chest pain and shortness of breath Fever: no Gastrointestinal: negative for nausea and vomiting.   Patient has not had a bowel movement.  Objective: Vital signs in last 24 hours: Temp:  [96.8 F (36 C)-100 F (37.8 C)] 98.1 F (36.7 C) (03/03 0800) Pulse Rate:  [65-73] 69 (03/03 0800) Resp:  [12-18] 16 (03/03 0800) BP: (100-133)/(53-89) 123/67 (03/03 0800) SpO2:  [97 %-100 %] 99 % (03/03 0800)  Intake/Output from previous day:  Intake/Output Summary (Last 24 hours) at 04/20/2020 0908 Last data filed at 04/20/2020 0300 Gross per 24 hour  Intake 3910.64 ml  Output 1100 ml  Net 2810.64 ml    Intake/Output this shift: No intake/output data recorded.  Labs: No results for input(s): HGB in the last 72 hours. No results for input(s): WBC, RBC, HCT, PLT in the last 72 hours. No results for input(s): NA, K, CL, CO2, BUN, CREATININE, GLUCOSE, CALCIUM in the last 72 hours. No results for input(s): LABPT, INR in the last 72 hours.   EXAM General - Patient is Alert, Appropriate and Oriented Extremity - Neurovascular intact Dorsiflexion/Plantar flexion intact Compartment soft Dressing/Incision -clean, dry, no drainage, Hemovac in place.  Motor Function - intact, moving foot and toes well on exam. Able to perform SLR Cardiovascular- Regular rate and rhythm, no murmurs/rubs/gallops Respiratory- Lungs clear to auscultation bilaterally Gastrointestinal- soft, nontender and active bowel sounds   Assessment/Plan: 1 Day Post-Op Procedure(s) (LRB): TOTAL HIP ARTHROPLASTY (Left) Active Problems:   Total knee replacement status  Estimated body mass index is 21.6 kg/m as calculated from the following:   Height as of  this encounter: 5\' 3"  (1.6 m).   Weight as of this encounter: 55.3 kg. Advance diet Up with therapy    DVT Prophylaxis - Lovenox, Ted hose and foot pumps Weight-Bearing as tolerated to left leg  Cassell Smiles, PA-C [facility] 04/20/2020, 9:08 AM

## 2020-04-20 NOTE — Progress Notes (Signed)
Physical Therapy Treatment Patient Details Name: Gina Middleton MRN: [id number] DOB: 1935-06-14 Today's Date: 04/20/2020    History of Present Illness Pt is 85  yo female s/p L THA, posterior approach WBAT. PMH of HTN, renal disease, back surgery, R THA.    PT Comments    Patient easily wakes at start of session, agreeable to PT. Reported 3.10 L hip pain. The patient was able to perform several exercises with improved muscle activation this PM, verbal/tactile cues as needed. Sit <> stand from recliner with RW and CGA, cued for hand placement when coming up into standing. She was able to ambulate ~287ft with RW and CGA. Improved gait quality noted; increased gait velocity, LLE weight bearing, and stride length, though still limited compared to PLOF. Pt up in chair with all needs in reach at end of session. The patient would benefit from further skilled PT intervention to continue to progress towards goals. Recommendation remains appropriate.     Follow Up Recommendations  Home health PT;Supervision/Assistance - 24 hour     Equipment Recommendations  None recommended by PT;Other (comment)    Recommendations for Other Services       Precautions / Restrictions Precautions Precautions: Posterior Hip Precaution Booklet Issued: Yes (comment) Precaution Comments: 3/3 precautions this PM Restrictions Weight Bearing Restrictions: Yes LLE Weight Bearing: Weight bearing as tolerated    Mobility  Bed Mobility               General bed mobility comments: pt in recliner at start/end of session    Transfers Overall transfer level: Needs assistance Equipment used: Rolling walker (2 wheeled) Transfers: Sit to/from Stand Sit to Stand: Min guard         General transfer comment: cued for hand placement  Ambulation/Gait Ambulation/Gait assistance: Min guard Gait Distance (Feet): 200 Feet Assistive device: Rolling walker (2 wheeled) Gait Pattern/deviations:  Antalgic;Decreased step length - left;Decreased stance time - left Gait velocity: decreased   General Gait Details: improved gait velocity noted compared to this AM, though still decreased. Able to increase weight bearing in LLE and improve gait distance as well   Stairs             Wheelchair Mobility    Modified Rankin (Stroke Patients Only)       Balance Overall balance assessment: Needs assistance Sitting-balance support: Feet supported Sitting balance-Leahy Scale: Good Sitting balance - Comments: pt able to sit and reach, and reposition in chair with supervision   Standing balance support: Bilateral upper extremity supported Standing balance-Leahy Scale: Fair Standing balance comment: improved standing balance noted, still reliant on RW                            Cognition Arousal/Alertness: Awake/alert Behavior During Therapy: WFL for tasks assessed/performed Overall Cognitive Status: Within Functional Limits for tasks assessed                                        Exercises Total Joint Exercises Ankle Circles/Pumps: AROM;Both;10 reps Quad Sets: AROM;Left;10 reps Gluteal Sets: AROM;Both;15 reps Towel Squeeze: AROM;Strengthening;Both;10 reps Short Arc Quad: AROM;Strengthening;10 reps;Left Hip ABduction/ADduction: AAROM;Strengthening;Left;10 reps Long Arc Quad: AROM;Strengthening;Both;10 reps    General Comments        Pertinent Vitals/Pain Pain Assessment: 0-10 Pain Score: 3  Pain Location: L hip with weightbearing/ADL mobility Pain Descriptors / Indicators:  Aching;Guarding;Sore Pain Intervention(s): Limited activity within patient's tolerance;Monitored during session;Repositioned    Home Living                      Prior Function            PT Goals (current goals can now be found in the care plan section) Progress towards PT goals: Progressing toward goals    Frequency    BID      PT Plan Current  plan remains appropriate    Co-evaluation              AM-PAC PT "6 Clicks" Mobility   Outcome Measure  Help needed turning from your back to your side while in a flat bed without using bedrails?: A Little Help needed moving from lying on your back to sitting on the side of a flat bed without using bedrails?: A Little Help needed moving to and from a bed to a chair (including a wheelchair)?: A Little Help needed standing up from a chair using your arms (e.g., wheelchair or bedside chair)?: A Little Help needed to walk in hospital room?: A Little Help needed climbing 3-5 steps with a railing? : A Little 6 Click Score: 18    End of Session Equipment Utilized During Treatment: Gait belt Activity Tolerance: Patient tolerated treatment well Patient left: in chair;with call bell/phone within reach;with SCD's reapplied Nurse Communication: Mobility status PT Visit Diagnosis: Other abnormalities of gait and mobility (R26.89);Pain;Muscle weakness (generalized) (M62.81);Difficulty in walking, not elsewhere classified (R26.2) Pain - Right/Left: Left Pain - part of body: Hip     Time: 0932-6712 PT Time Calculation (min) (ACUTE ONLY): 24 min  Charges:  $Gait Training: 8-22 mins $Therapeutic Exercise: 8-22 mins                     Lieutenant Diego PT, DPT 3:57 PM,04/20/20

## 2020-04-20 NOTE — Evaluation (Signed)
Occupational Therapy Evaluation Patient Details Name: Gina Middleton MRN: [id number] DOB: 1935-05-18 Today's Date: 04/20/2020    History of Present Illness Pt is 85  yo female s/p L THA, posterior approach WBAT. PMH of HTN, renal disease, back surgery, R THA.   Clinical Impression   Pt seen for OT evaluation this date, POD#1 from above surgery. Pt was independent in all ADL prior to surgery, using a QC for ADL mobility, and having increasing difficulty with prolonged standing during ADL/IADL tasks which was limiting her participation 2/2 L hip pain. Pt is eager to return to PLOF with less pain and improved safety and independence. Pt currently requires MIN A for LB dressing and bathing while in seated position due to pain and limited AROM of L hip, CGA for ADL transfers with occasional cues for RW mgt, hand placement, and techniques to maintain posterior THPs. Pt able to recall 2/3 posterior total hip precautions at start of session (difficulty with recall of no IR of L hip) and unable to verbalize how to implement during ADL and mobility. Pt instructed in posterior total hip precautions and how to implement, self care skills, falls prevention strategies, home/routines modifications, DME/AE for LB bathing and dressing tasks, compression stocking mgt strategies, and RW mgt techniques. Pt verbalized understanding, demonstrated understanding with some cues. At end of session, pt able to recall 3/3 posterior total hip precautions. Pt would benefit from additional instruction in self care skills and techniques to help maintain precautions with or without assistive devices to support recall and carryover prior to discharge. Recommend HHOT and supervision during ADL mobility upon discharge. Pt has all necessary equipment.    Follow Up Recommendations  Home health OT;Supervision/Assistance - 24 hour (supervision for ADL mobility)    Equipment Recommendations  None recommended by OT    Recommendations  for Other Services       Precautions / Restrictions Precautions Precautions: Posterior Hip Precaution Booklet Issued: Yes (comment) Precaution Comments: able to recall 2/3 at start of session, 3/3 at end of session Restrictions Weight Bearing Restrictions: Yes LLE Weight Bearing: Weight bearing as tolerated      Mobility Bed Mobility Overal bed mobility: Needs Assistance Bed Mobility: Supine to Sit     Supine to sit: Supervision;HOB elevated     General bed mobility comments: increased time/effort, use of rails    Transfers Overall transfer level: Needs assistance Equipment used: Rolling walker (2 wheeled) Transfers: Sit to/from Stand Sit to Stand: Min guard         General transfer comment: cues for hand placement    Balance Overall balance assessment: Needs assistance Sitting-balance support: Feet supported Sitting balance-Leahy Scale: Good Sitting balance - Comments: pt able to wipe while sitting with slight lateral lean on BSC with supervision   Standing balance support: Bilateral upper extremity supported Standing balance-Leahy Scale: Fair                             ADL either performed or assessed with clinical judgement   ADL Overall ADL's : Needs assistance/impaired                                       General ADL Comments: CGA for ADL transfers, MIN A for LB ADL, cues for maintaining posterior THPs during ADL/mobility, MAX A compression stocking mgt (pt reports family able to  provide needed assist)     Vision Baseline Vision/History: Wears glasses Wears Glasses: At all times Patient Visual Report: No change from baseline       Perception     Praxis      Pertinent Vitals/Pain Pain Assessment: 0-10 Pain Score: 7  Pain Location: L hip with weightbearing/ADL mobility Pain Descriptors / Indicators: Aching;Guarding Pain Intervention(s): Limited activity within patient's tolerance;Monitored during  session;Premedicated before session;Repositioned     Hand Dominance Right   Extremity/Trunk Assessment Upper Extremity Assessment Upper Extremity Assessment: Overall WFL for tasks assessed   Lower Extremity Assessment Lower Extremity Assessment: LLE deficits/detail LLE Deficits / Details: s/p L THA   Cervical / Trunk Assessment Cervical / Trunk Assessment: Normal   Communication Communication Communication: No difficulties   Cognition Arousal/Alertness: Awake/alert Behavior During Therapy: WFL for tasks assessed/performed Overall Cognitive Status: Within Functional Limits for tasks assessed                                     General Comments       Exercises Other Exercises Other Exercises: Pt instructed in posterior THPs and how to maintain during ADL and ADL mobility; self care skills, AE/DME, falls prevention, compression stocking mgt, RW mgt, and home/routines modifications to maximize safety/indep; handout provided Other Exercises: Pt performed toileting on BSC with CGA for transfer, lateral lean and set up for pericare   Shoulder Instructions      Home Living Family/patient expects to be discharged to:: Private residence Living Arrangements: Spouse/significant other Available Help at Discharge: Family;Available 24 hours/day Type of Home: House Home Access: Stairs to enter;Ramped entrance     Home Layout: One level     Bathroom Shower/Tub: Walk-in shower (small, stall shower)   Bathroom Toilet: Handicapped height (riser over top)     Home Equipment: Walker - 2 wheels;Cane - quad;Cane - single point;Bedside commode;Toilet riser;Adaptive equipment;Wheelchair - Higher education careers adviser: Reacher;Sock aid Additional Comments: 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; home information reported for the home pt will discharge to      Prior Functioning/Environment Level of Independence: Independent with assistive device(s)         Comments: patient ambulates with quad cane, indep with basic ADL, driving, working (desk work), however had difficulty with activities requiring long periods of standing; no falls in 80mo        OT Problem List: Decreased strength;Decreased range of motion;Pain;Decreased activity tolerance;Impaired balance (sitting and/or standing);Decreased knowledge of use of DME or AE;Decreased knowledge of precautions      OT Treatment/Interventions: Self-care/ADL training;Therapeutic exercise;Therapeutic activities;DME and/or AE instruction;Patient/family education;Balance training    OT Goals(Current goals can be found in the care plan section) Acute Rehab OT Goals Patient Stated Goal: go home and get better OT Goal Formulation: With patient Time For Goal Achievement: 05/04/20 Potential to Achieve Goals: Good ADL Goals Pt Will Perform Lower Body Dressing: with min assist;sit to/from stand;with adaptive equipment Pt Will Transfer to Toilet: with supervision;ambulating (elevated commode, LRAD for amb, maintaining posterior THPs) Additional ADL Goal #1: Pt will independently verbalize 3/3 posterior THPs and how to maintain during ADL tasks. Additional ADL Goal #2: Pt will independently instruct family/caregiver in compression stocking mgt.  OT Frequency: Min 2X/week   Barriers to D/C:            Co-evaluation  AM-PAC OT "6 Clicks" Daily Activity     Outcome Measure Help from another person eating meals?: None Help from another person taking care of personal grooming?: None Help from another person toileting, which includes using toliet, bedpan, or urinal?: A Little Help from another person bathing (including washing, rinsing, drying)?: A Little Help from another person to put on and taking off regular upper body clothing?: None Help from another person to put on and taking off regular lower body clothing?: A Little 6 Click Score: 21   End of Session Equipment Utilized  During Treatment: Rolling walker;Gait belt Nurse Communication: Mobility status  Activity Tolerance: Patient tolerated treatment well Patient left: Other (comment) (seated on BSC with PT for session)  OT Visit Diagnosis: Other abnormalities of gait and mobility (R26.89);Pain Pain - Right/Left: Left Pain - part of body: Hip                Time: 7371-0626 OT Time Calculation (min): 22 min Charges:  OT General Charges $OT Visit: 1 Visit OT Evaluation $OT Eval Moderate Complexity: 1 Mod OT Treatments $Self Care/Home Management : 8-22 mins  Hanley Hays, MPH, MS, OTR/L ascom [phone] 04/20/20, 9:56 AM

## 2020-04-20 NOTE — TOC Initial Note (Signed)
Transition of Care [facility]) - Initial/Assessment Note    Patient Details  Name: Gina Middleton MRN: [id number] Date of Birth: 1935/06/13  Transition of Care [facility]) CM/SW Contact:    Anselm Pancoast, RN Phone Number: 04/20/2020, 1:44 PM  Clinical Narrative:                 Confirmed with Drue Novel @ [facility] already set up for [facility] services after discharge.         Patient Goals and CMS Choice        Expected Discharge Plan and Services                                                Prior Living Arrangements/Services                       Activities of Daily Living Home Assistive Devices/Equipment: Engineer, drilling (specify type) ADL Screening (condition at time of admission) Patient's cognitive ability adequate to safely complete daily activities?: Yes Is the patient deaf or have difficulty hearing?: No Does the patient have difficulty seeing, even when wearing glasses/contacts?: No Does the patient have difficulty concentrating, remembering, or making decisions?: No Patient able to express need for assistance with ADLs?: Yes Does the patient have difficulty dressing or bathing?: No Independently performs ADLs?: Yes (appropriate for developmental age) Does the patient have difficulty walking or climbing stairs?: Yes Weakness of Legs: Left Weakness of Arms/Hands: None  Permission Sought/Granted                  Emotional Assessment              Admission diagnosis:  Total knee replacement status [Z96.659] Patient Active Problem List   Diagnosis Date Noted  . Status post total hip replacement, left 04/19/2020  . Primary osteoarthritis of left hip 02/06/2020  . Leg swelling 11/19/2019  . Status post total replacement of right hip 08/25/2019  . Arthritis of hip 04/27/2018  . Renal stone 04/09/2018  . Nephrolithiasis 04/09/2018  . Achilles tendonitis 05/12/2015  . Allergic rhinitis 09/20/2014  . Benign paroxysmal  positional nystagmus 09/20/2014  . Blood glucose elevated 09/20/2014  . Neuralgia neuritis, sciatic nerve 09/20/2014  . Vegetarian 09/20/2014  . B12 deficiency 09/20/2014  . Avitaminosis D 09/20/2014  . Other specified health status 09/20/2014  . Hypertension 09/07/2014   PCP:  [name], PA-C Pharmacy:   [facility] [phone] [address], [address] [place] [address] Phone: [phone] Fax: [phone]     Social Determinants of Health (SDOH) Interventions    Readmission Risk Interventions No flowsheet data found.

## 2020-04-20 NOTE — Progress Notes (Addendum)
Physical Therapy Treatment Patient Details Name: Gina Middleton MRN: [id number] DOB: 1935/10/10 Today's Date: 04/20/2020    History of Present Illness Pt is 85  yo female s/p L THA, posterior approach WBAT. PMH of HTN, renal disease, back surgery, R THA.    PT Comments    Patient alert, agreeable to PT, finishing up on commode with OT. Pt reported 6-7/10 pain in L hip. Sit <> Stand from [facility] with CGA and RW, very slow and pt cautious do to increased pain. Pt able to complete sit to stand without verbal cueing, but necessary for technique when returning to sitting after ambulation. She was able to ambulate ~65ft with RW and CGA. Pt exhibited antalgic gait with LLE. Varying from step to gait pattern, minimal step through noted but improved weight bearing noted over time. Returned to chair and able to participate with exercises with verbal/visual cueing. The patient would benefit from further skilled PT intervention to continue to progress towards goals. Recommendation remains appropriate.    Follow Up Recommendations  Home health PT;Supervision/Assistance - 24 hour     Equipment Recommendations  None recommended by PT;Other (comment) (pt has RW and BSC)    Recommendations for Other Services       Precautions / Restrictions Precautions Precautions: Posterior Hip Precaution Booklet Issued: Yes (comment) Precaution Comments: 2/3 precautions per OT Restrictions Weight Bearing Restrictions: Yes LLE Weight Bearing: Weight bearing as tolerated    Mobility  Bed Mobility  Transfers Overall transfer level: Needs assistance Equipment used: Rolling walker (2 wheeled) Transfers: Sit to/from Stand Sit to Stand: Min guard         General transfer comment: able to do without verbal cueing to stand, but to sit given verbal cues for hand placement. pt able to complete but very slowly  Ambulation/Gait Ambulation/Gait assistance: Min guard Gait Distance (Feet): 100 Feet Assistive  device: Rolling walker (2 wheeled) Gait Pattern/deviations: Antalgic;Decreased step length - left;Decreased stance time - left Gait velocity: decreased   General Gait Details: antalgic on LLE. varying from step to gait pattern, minimal step through noted but improved weight bearing noted over time   Stairs             Wheelchair Mobility    Modified Rankin (Stroke Patients Only)       Balance Overall balance assessment: Needs assistance Sitting-balance support: Feet supported Sitting balance-Leahy Scale: Good Sitting balance - Comments: pt able to sit and reach, and reposition in chair with supervision   Standing balance support: Bilateral upper extremity supported Standing balance-Leahy Scale: Fair Standing balance comment: reliant on RW for support due to increased pain today                            Cognition Arousal/Alertness: Awake/alert Behavior During Therapy: WFL for tasks assessed/performed Overall Cognitive Status: Within Functional Limits for tasks assessed                                        Exercises   General Comments        Pertinent Vitals/Pain Pain Assessment: 0-10 Pain Score: 7  Pain Location: L hip with weightbearing/ADL mobility Pain Descriptors / Indicators: Aching;Guarding;Sore Pain Intervention(s): Limited activity within patient's tolerance;Monitored during session;Premedicated before session;Repositioned    Home Living Family/patient expects to be discharged to:: Private residence Living Arrangements: Spouse/significant other Available Help  at Discharge: Family;Available 24 hours/day Type of Home: House Home Access: Stairs to enter;Ramped entrance   Home Layout: One level Home Equipment: Walker - 2 wheels;Cane - quad;Cane - single point;Bedside commode;Toilet riser;Adaptive equipment;Wheelchair - manual Additional Comments: 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; home information  reported for the home pt will discharge to    Prior Function Level of Independence: Independent with assistive device(s)      Comments: patient ambulates with quad cane, indep with basic ADL, driving, working (desk work), however had difficulty with activities requiring long periods of standing; no falls in 41mo   PT Goals (current goals can now be found in the care plan section) Acute Rehab PT Goals Patient Stated Goal: go home and get better Progress towards PT goals: Progressing toward goals    Frequency    BID      PT Plan Current plan remains appropriate    Co-evaluation              AM-PAC PT "6 Clicks" Mobility   Outcome Measure  Help needed turning from your back to your side while in a flat bed without using bedrails?: A Little Help needed moving from lying on your back to sitting on the side of a flat bed without using bedrails?: A Little Help needed moving to and from a bed to a chair (including a wheelchair)?: A Little Help needed standing up from a chair using your arms (e.g., wheelchair or bedside chair)?: A Little Help needed to walk in hospital room?: A Little Help needed climbing 3-5 steps with a railing? : A Little 6 Click Score: 18    End of Session Equipment Utilized During Treatment: Gait belt Activity Tolerance: Patient tolerated treatment well Patient left: in chair;with call bell/phone within reach Nurse Communication: Mobility status PT Visit Diagnosis: Other abnormalities of gait and mobility (R26.89);Pain;Muscle weakness (generalized) (M62.81);Difficulty in walking, not elsewhere classified (R26.2) Pain - Right/Left: Left Pain - part of body: Hip     Time: 5520-8022 PT Time Calculation (min) (ACUTE ONLY): 18 min  Charges:  $Gait Training: 8-22 mins                     Lieutenant Diego PT, DPT 10:12 AM,04/20/20

## 2020-04-20 NOTE — Plan of Care (Signed)

## 2020-04-20 NOTE — Progress Notes (Signed)
  Subjective: 1 Day Post-Op Procedure(s) (LRB): TOTAL HIP ARTHROPLASTY (Left) Patient reports pain as well-controlled.   Patient is well, and has had no acute complaints or problems. Reports episode of earlier "wooziness" has resolved.  Plan is to go Home after hospital stay. Negative for chest pain and shortness of breath Fever: no Gastrointestinal: negative for nausea and vomiting.   Patient has not had a bowel movement.  Objective: Vital signs in last 24 hours: Temp:  [96.8 F (36 C)-98.8 F (37.1 C)] 98.8 F (37.1 C) (03/03 1635) Pulse Rate:  [66-77] 73 (03/03 1635) Resp:  [16-18] 18 (03/03 1635) BP: (107-123)/(53-67) 111/56 (03/03 1635) SpO2:  [96 %-99 %] 96 % (03/03 1635)  Intake/Output from previous day:  Intake/Output Summary (Last 24 hours) at 04/20/2020 1750 Last data filed at 04/20/2020 1332 Gross per 24 hour  Intake 1955.66 ml  Output 495 ml  Net 1460.66 ml    Intake/Output this shift: Total I/O In: 1055.7 [I.V.:857.6; IV Piggyback:198.1] Out: 395 [Urine:325; Drains:70]  Labs: No results for input(s): HGB in the last 72 hours. No results for input(s): WBC, RBC, HCT, PLT in the last 72 hours. No results for input(s): NA, K, CL, CO2, BUN, CREATININE, GLUCOSE, CALCIUM in the last 72 hours. No results for input(s): LABPT, INR in the last 72 hours.   EXAM General - Patient is Alert, Appropriate and Oriented Extremity - Neurovascular intact Dorsiflexion/Plantar flexion intact Compartment soft Dressing/Incision -Hemovac in place.  Motor Function - intact, moving foot and toes well on exam.  Cardiovascular- Regular rate and rhythm, no murmurs/rubs/gallops Respiratory- Lungs clear to auscultation bilaterally Gastrointestinal- soft, nontender and active bowel sounds   Assessment/Plan: 1 Day Post-Op Procedure(s) (LRB): TOTAL HIP ARTHROPLASTY (Left) Active Problems:   Status post total hip replacement, left  Estimated body mass index is 21.6 kg/m as  calculated from the following:   Height as of this encounter: 5\' 3"  (1.6 m).   Weight as of this encounter: 55.3 kg. Advance diet Up with therapy  Spoke with patient about possibility of discharge this PM, but she has not yet demonstrated ability to safely navigate stairs. Spoke with PT who states this will be done at the AM session.  DVT Prophylaxis - Lovenox, Ted hose and foot pumps Weight-Bearing as tolerated to left leg  Cassell Smiles, PA-C [facility] 04/20/2020, 5:50 PM

## 2020-04-21 ENCOUNTER — Encounter: Payer: Self-pay | Admitting: Orthopedic Surgery

## 2020-04-21 DIAGNOSIS — M1612 Unilateral primary osteoarthritis, left hip: Secondary | ICD-10-CM | POA: Diagnosis not present

## 2020-04-21 LAB — SURGICAL PATHOLOGY

## 2020-04-21 MED ORDER — CELECOXIB 200 MG PO CAPS: 200.0000 mg | ORAL_CAPSULE | Freq: Two times a day (BID) | ORAL | 0 refills | Status: DC

## 2020-04-21 MED ORDER — CELECOXIB 200 MG PO CAPS
ORAL_CAPSULE | ORAL | Status: AC
  Filled 2020-04-21: qty 1

## 2020-04-21 MED ORDER — METOCLOPRAMIDE HCL 10 MG PO TABS
ORAL_TABLET | ORAL | Status: AC
  Administered 2020-04-21: 10 mg via ORAL
  Filled 2020-04-21: qty 1

## 2020-04-21 MED ORDER — ENOXAPARIN SODIUM 40 MG/0.4ML ~~LOC~~ SOLN: 40.0000 mg | SUBCUTANEOUS | 0 refills | Status: DC

## 2020-04-21 MED ORDER — ENOXAPARIN SODIUM 40 MG/0.4ML ~~LOC~~ SOLN
SUBCUTANEOUS | Status: AC
  Administered 2020-04-21: 40 mg via SUBCUTANEOUS
  Filled 2020-04-21: qty 0.4

## 2020-04-21 MED ORDER — ACETAMINOPHEN 325 MG PO TABS
ORAL_TABLET | ORAL | Status: AC
  Filled 2020-04-21: qty 2

## 2020-04-21 MED ORDER — MAGNESIUM HYDROXIDE 400 MG/5ML PO SUSP
ORAL | Status: AC
  Filled 2020-04-21: qty 30

## 2020-04-21 NOTE — Progress Notes (Signed)
  Subjective: 2 Days Post-Op Procedure(s) (LRB): TOTAL HIP ARTHROPLASTY (Left) Patient reports pain as well-controlled.   Patient is well, and has had no acute complaints or problems Plan is to go Home after hospital stay. Negative for chest pain and shortness of breath Fever: no Gastrointestinal: negative for nausea and vomiting.   Patient has had a bowel movement.  Objective: Vital signs in last 24 hours: Temp:  [98.8 F (37.1 C)-99.3 F (37.4 C)] 99.2 F (37.3 C) (03/04 0600) Pulse Rate:  [66-77] 68 (03/04 0600) Resp:  [16-18] 16 (03/04 0600) BP: (108-122)/(51-69) 122/69 (03/04 0600) SpO2:  [94 %-97 %] 97 % (03/04 0600)  Intake/Output from previous day:  Intake/Output Summary (Last 24 hours) at 04/21/2020 0853 Last data filed at 04/21/2020 0112 Gross per 24 hour  Intake 1055.66 ml  Output 405 ml  Net 650.66 ml    Intake/Output this shift: No intake/output data recorded.  Labs: No results for input(s): HGB in the last 72 hours. No results for input(s): WBC, RBC, HCT, PLT in the last 72 hours. No results for input(s): NA, K, CL, CO2, BUN, CREATININE, GLUCOSE, CALCIUM in the last 72 hours. No results for input(s): LABPT, INR in the last 72 hours.   EXAM General - Patient is Alert, Appropriate and Oriented Extremity - Neurovascular intact Dorsiflexion/Plantar flexion intact Compartment soft Dressing/Incision -clean, dry, no drainage, Hemovac in place. Mild serosanguinous drainage noted from drain site Motor Function - intact, moving foot and toes well on exam.  Cardiovascular- Regular rate and rhythm, no murmurs/rubs/gallops Respiratory- Lungs clear to auscultation bilaterally Gastrointestinal- soft and nontender   Assessment/Plan: 2 Days Post-Op Procedure(s) (LRB): TOTAL HIP ARTHROPLASTY (Left) Active Problems:   Status post total hip replacement, left  Estimated body mass index is 21.6 kg/m as calculated from the following:   Height as of this encounter: 5'  3" (1.6 m).   Weight as of this encounter: 55.3 kg. Advance diet Up with therapy Discharge home pending completion of stairs with PT   DVT Prophylaxis - Lovenox, Ted hose and foot pumps Weight-Bearing as tolerated to left leg  Cassell Smiles, PA-C [facility] 04/21/2020, 8:53 AM

## 2020-04-21 NOTE — Discharge Summary (Signed)
Physician Discharge Summary  Patient ID: Gina Middleton MRN: [id number] DOB/AGE: 1935/04/04 85 y.o.  Admit date: 04/19/2020 Discharge date: 04/21/2020  Admission Diagnoses:  Total knee replacement status [Z96.659]  Surgeries:Procedure(s): Left total hip arthroplasty  SURGEON:  Marciano Sequin. M.D.  ASSISTANT: Cassell Smiles, PA-C (present and scrubbed throughout the case, critical for assistance with exposure, retraction, instrumentation, and closure)  ANESTHESIA: spinal  ESTIMATED BLOOD LOSS: 75 mL  FLUIDS REPLACED: 1100 mL of crystalloid  DRAINS: 2 medium Hemovac  IMPLANTS UTILIZED: DePuy 13.5 mm small stature AML femoral stem, 52 mm OD Pinnacle 100 acetabular component, neutral Pinnacle Altrx polyethylene insert, and a 36 mm M-SPEC +1.5 mm hip ball  Discharge Diagnoses: Patient Active Problem List   Diagnosis Date Noted   Status post total hip replacement, left 04/19/2020   Primary osteoarthritis of left hip 02/06/2020   Leg swelling 11/19/2019   Status post total replacement of right hip 08/25/2019   Arthritis of hip 04/27/2018   Renal stone 04/09/2018   Nephrolithiasis 04/09/2018   Achilles tendonitis 05/12/2015   Allergic rhinitis 09/20/2014   Benign paroxysmal positional nystagmus 09/20/2014   Blood glucose elevated 09/20/2014   Neuralgia neuritis, sciatic nerve 09/20/2014   Vegetarian 09/20/2014   B12 deficiency 09/20/2014   Avitaminosis D 09/20/2014   Other specified health status 09/20/2014   Hypertension 09/07/2014    Past Medical History:  Diagnosis Date   Actinic keratosis 10/08/2012   Left mid to distal tricep    Arthritis    Basal cell carcinoma 07/21/2018   Left inferior antihelix   Complication of anesthesia    History of kidney stones    Hypertension    Pneumonia 03/2017   PONV (postoperative nausea and vomiting) 1980's   n/v with lap choley and n/v after 08-2019 hip surgery     Transfusion:     Consultants (if any):   Discharged Condition: Improved  Hospital Course: [name] is an 85 y.o. female who was admitted 04/19/2020 with a diagnosis of left hip osteoarthritis and went to the operating room on 04/19/2020 and underwent left total hip arthroplasty through posterior approach. The patient received perioperative antibiotics for prophylaxis (see below). The patient tolerated the procedure well and was transported to PACU in stable condition. After meeting PACU criteria, the patient was subsequently transferred to the Orthopaedics/Rehabilitation unit.   The patient received DVT prophylaxis in the form of early mobilization, Lovenox, Foot Pumps and TED hose. A sacral pad had been placed and heels were elevated off of the bed with rolled towels in order to protect skin integrity. Foley catheter was discontinued on postoperative day #0. Wound drains were discontinued on postoperative day #2. The surgical incision was healing well without signs of infection.  Physical therapy was initiated postoperatively for transfers, gait training, and strengthening. Occupational therapy was initiated for activities of daily living and evaluation for assisted devices. Rehabilitation goals were reviewed in detail with the patient. The patient made steady progress with physical therapy and physical therapy recommended discharge to Home.   The patient achieved the preliminary goals of this hospitalization and was felt to be medically and orthopaedically appropriate for discharge.  She was given perioperative antibiotics:  Anti-infectives (From admission, onward)   Start     Dose/Rate Route Frequency Ordered Stop   04/19/20 1430  ceFAZolin (ANCEF) IVPB 2g/100 mL premix        2 g 200 mL/hr over 30 Minutes Intravenous Every 6 hours 04/19/20 1408 04/19/20 2044  04/19/20 0704  ceFAZolin (ANCEF) 2-4 GM/100ML-% IVPB       Note to Pharmacy: [place] [facility], [facility]: cabinet override      04/19/20 0704  04/19/20 1700   04/19/20 0700  ceFAZolin (ANCEF) IVPB 2g/100 mL premix        2 g 200 mL/hr over 30 Minutes Intravenous On call to O.R. 04/19/20 1749 04/19/20 0840    .  Recent vital signs:  Vitals:   04/20/20 1952 04/21/20 0600  BP: (!) 108/51 122/69  Pulse: 66 68  Resp: 16 16  Temp: 99.3 F (37.4 C) 99.2 F (37.3 C)  SpO2: 94% 97%    Recent laboratory studies:  No results for input(s): WBC, HGB, HCT, PLT, K, CL, CO2, BUN, CREATININE, GLUCOSE, CALCIUM, LABPT, INR in the last 72 hours.  Diagnostic Studies: DG Hip Port Unilat With Pelvis 1V Left  Result Date: 04/19/2020 CLINICAL DATA:  Status post left hip arthroplasty. EXAM: DG HIP (WITH OR WITHOUT PELVIS) 1V PORT LEFT COMPARISON:  None. FINDINGS: The left femoral and acetabular components are well situated. Expected postoperative changes are seen in the surrounding soft tissues. IMPRESSION: Status post left total hip arthroplasty. Electronically Signed   By: Marijo Conception M.D.   On: 04/19/2020 14:13    Discharge Medications:   Allergies as of 04/21/2020      Reactions   Other Anaphylaxis   A muscle relaxer in the 70s but cannot remember name of the medication that caused it   Hydrocodone-acetaminophen Other (See Comments)   dizziness   Lisinopril    Hair thinning   Oxycodone-acetaminophen Nausea And Vomiting   Iodinated Diagnostic Agents Rash      Medication List    STOP taking these medications   aspirin EC 81 MG tablet   meloxicam 7.5 MG tablet Commonly known as: MOBIC     TAKE these medications   acetaminophen 325 MG tablet Commonly known as: TYLENOL Take 325 mg by mouth every 6 (six) hours as needed.   amLODipine 5 MG tablet Commonly known as: NORVASC Take 1 tablet (5 mg total) by mouth daily.   celecoxib 200 MG capsule Commonly known as: CELEBREX Take 1 capsule (200 mg total) by mouth 2 (two) times daily.   enoxaparin 40 MG/0.4ML injection Commonly known as: LOVENOX Inject 0.4 mLs (40 mg total)  into the skin daily for 14 days.   losartan-hydrochlorothiazide 50-12.5 MG tablet Commonly known as: HYZAAR Take 1 tablet by mouth daily. What changed: when to take this            Durable Medical Equipment  (From admission, onward)         Start     Ordered   04/19/20 1409  DME Walker rolling  Once       Question:  Patient needs a walker to treat with the following condition  Answer:  S/P total hip arthroplasty   04/19/20 1408   04/19/20 1409  DME Bedside commode  Once       Question:  Patient needs a bedside commode to treat with the following condition  Answer:  S/P total hip arthroplasty   04/19/20 1408          Disposition: home with home health PT      Follow-up Information    Dereck Leep, MD On 06/01/2020.   Specialty: Orthopedic Surgery Why: at 1:30pm Contact information: [address] [place] [phone]  Precious Gilchrest, PA-C 04/21/2020, 8:56 AM

## 2020-04-21 NOTE — Progress Notes (Signed)
Physical Therapy Treatment Patient Details Name: Gina Middleton MRN: [id number] DOB: [birth year] Today's Date: 04/21/2020    History of Present Illness Pt is 85  yo female s/p L THA, posterior approach WBAT. PMH of HTN, renal disease, back surgery, R THA.    PT Comments    Patient alert, finishing transferring to recliner with CNA. Reported 7/10 in L hip this AM. The patient was able to perform several exercises with tactile cues, assist for elevated pain today. Sit <> stand with supervision/CGA, cued for hand placement. She was able to perform stair navigation with CGA, cues for technique with good carry over. Safe and steady, adequate for discharge. She was able to ambulate ~15ft with RW and CGA.  Returned to chair in room with all needs in reach. Pt and PT also reviewed safe car transfer. The patient would benefit from further skilled PT intervention to continue to progress towards goals. Recommendation remains appropriate.       Follow Up Recommendations  Home health PT;Supervision/Assistance - 24 hour     Equipment Recommendations  None recommended by PT    Recommendations for Other Services       Precautions / Restrictions Precautions Precautions: Posterior Hip Precaution Booklet Issued: Yes (comment) Precaution Comments: 3/3 precautions this AM Restrictions Weight Bearing Restrictions: Yes LLE Weight Bearing: Weight bearing as tolerated    Mobility  Bed Mobility               General bed mobility comments: pt in recliner at start/end of session    Transfers Overall transfer level: Needs assistance Equipment used: Rolling walker (2 wheeled) Transfers: Sit to/from Stand Sit to Stand: Supervision;Min guard         General transfer comment: cued for hand placement  Ambulation/Gait Ambulation/Gait assistance: Supervision Gait Distance (Feet): 90 Feet Assistive device: Rolling walker (2 wheeled) Gait Pattern/deviations: Antalgic;Decreased step length  - left;Decreased stance time - left Gait velocity: decreased   General Gait Details: increased pain this AM; able to perform reciprocal gait pattern with prompting but reverted to step to due to pain   Stairs Stairs: Yes Stairs assistance: Min guard Stair Management: Two rails;Step to pattern Number of Stairs: 4     Wheelchair Mobility    Modified Rankin (Stroke Patients Only)       Balance Overall balance assessment: Needs assistance Sitting-balance support: Feet supported Sitting balance-Leahy Scale: Good Sitting balance - Comments: pt able to sit and reach, and reposition in chair with supervision   Standing balance support: Bilateral upper extremity supported Standing balance-Leahy Scale: Fair Standing balance comment: improved standing balance noted, still reliant on RW                            Cognition Arousal/Alertness: Awake/alert Behavior During Therapy: WFL for tasks assessed/performed Overall Cognitive Status: Within Functional Limits for tasks assessed                                        Exercises Total Joint Exercises Ankle Circles/Pumps: AROM;Both;10 reps Short Arc Quad: AROM;Strengthening;Left;15 reps Heel Slides: AAROM;Left;10 reps Hip ABduction/ADduction: AAROM;Strengthening;Left;10 reps    General Comments        Pertinent Vitals/Pain Pain Assessment: 0-10 Pain Score: 7  Pain Location: L hip with weightbearing/ADL mobility Pain Descriptors / Indicators: Aching;Guarding;Sore Pain Intervention(s): Limited activity within patient's tolerance;Monitored during session;Repositioned  Home Living                      Prior Function            PT Goals (current goals can now be found in the care plan section) Progress towards PT goals: Progressing toward goals    Frequency    BID      PT Plan Current plan remains appropriate    Co-evaluation              AM-PAC PT "6 Clicks"  Mobility   Outcome Measure  Help needed turning from your back to your side while in a flat bed without using bedrails?: A Little Help needed moving from lying on your back to sitting on the side of a flat bed without using bedrails?: A Little Help needed moving to and from a bed to a chair (including a wheelchair)?: A Little Help needed standing up from a chair using your arms (e.g., wheelchair or bedside chair)?: A Little Help needed to walk in hospital room?: A Little Help needed climbing 3-5 steps with a railing? : A Little 6 Click Score: 18    End of Session Equipment Utilized During Treatment: Gait belt Activity Tolerance: Patient tolerated treatment well Patient left: in chair;with call bell/phone within reach Nurse Communication: Mobility status PT Visit Diagnosis: Other abnormalities of gait and mobility (R26.89);Pain;Muscle weakness (generalized) (M62.81);Difficulty in walking, not elsewhere classified (R26.2) Pain - Right/Left: Left Pain - part of body: Hip     Time: 2395-3202 PT Time Calculation (min) (ACUTE ONLY): 23 min  Charges:  $Gait Training: 8-22 mins $Therapeutic Exercise: 8-22 mins                     Lieutenant Diego PT, DPT 10:55 AM,04/21/20

## 2020-05-09 ENCOUNTER — Other Ambulatory Visit: Payer: Self-pay

## 2020-05-09 ENCOUNTER — Encounter: Payer: Self-pay | Admitting: Physician Assistant

## 2020-05-09 ENCOUNTER — Telehealth (INDEPENDENT_AMBULATORY_CARE_PROVIDER_SITE_OTHER): Payer: Medicare PPO | Admitting: Physician Assistant

## 2020-05-09 VITALS — Wt 116.0 lb

## 2020-05-09 DIAGNOSIS — I10 Essential (primary) hypertension: Secondary | ICD-10-CM | POA: Diagnosis not present

## 2020-05-09 MED ORDER — LOSARTAN POTASSIUM-HCTZ 50-12.5 MG PO TABS: 1.0000 | ORAL_TABLET | Freq: Every day | ORAL | 3 refills | Status: DC

## 2020-05-09 NOTE — Progress Notes (Signed)
Virtual telephone visit    Virtual Visit via Telephone Note   This visit type was conducted due to national recommendations for restrictions regarding the COVID-19 Pandemic (e.g. social distancing) in an effort to limit this patient's exposure and mitigate transmission in our community. Due to her co-morbid illnesses, this patient is at least at moderate risk for complications without adequate follow up. This format is felt to be most appropriate for this patient at this time. The patient did not have access to video technology or had technical difficulties with video requiring transitioning to audio format only (telephone). Physical exam was limited to content and character of the telephone converstion.    Patient location: Home Provider location: [facility]  I discussed the limitations of evaluation and management by telemedicine and the availability of in person appointments. The patient expressed understanding and agreed to proceed.   Visit Date: 05/09/2020  Today's healthcare provider: [name], PA-C   Chief Complaint  Patient presents with  . Follow-up   Subjective    HPI  Follow up for hypertension  The patient was last seen for this 4 weeks ago. Changes made at last visit include continue losartan-hctz 50-12.5mg , and add Amlodipine 5mg .  She reports fair compliance with treatment. Patient reports she had surgery on 04/20/2020, and reports that her BP has been stable 130's/70's and she has D/C Amlodipine 5mg . Patient reports physical therapy is checking her blood pressure and her readings are stable. She feels that condition is Improved. She is not having side effects.   -----------------------------------------------------------------------------------------   Patient Active Problem List   Diagnosis Date Noted  . Status post total hip replacement, left 04/19/2020  . Primary osteoarthritis of left hip 02/06/2020  . Leg swelling 11/19/2019  . Status post total  replacement of right hip 08/25/2019  . Arthritis of hip 04/27/2018  . Renal stone 04/09/2018  . Nephrolithiasis 04/09/2018  . Achilles tendonitis 05/12/2015  . Allergic rhinitis 09/20/2014  . Benign paroxysmal positional nystagmus 09/20/2014  . Blood glucose elevated 09/20/2014  . Neuralgia neuritis, sciatic nerve 09/20/2014  . Vegetarian 09/20/2014  . B12 deficiency 09/20/2014  . Avitaminosis D 09/20/2014  . Other specified health status 09/20/2014  . Hypertension 09/07/2014   Social History   Tobacco Use  . Smoking status: Never Smoker  . Smokeless tobacco: Never Used  Vaping Use  . Vaping Use: Never used  Substance Use Topics  . Alcohol use: Not Currently  . Drug use: No   Allergies  Allergen Reactions  . Other Anaphylaxis    A muscle relaxer in the 70s but cannot remember name of the medication that caused it  . Hydrocodone-Acetaminophen Other (See Comments)    dizziness  . Lisinopril     Hair thinning  . Oxycodone-Acetaminophen Nausea And Vomiting  . Iodinated Diagnostic Agents Rash      Medications: Outpatient Medications Prior to Visit  Medication Sig  . acetaminophen (TYLENOL) 325 MG tablet Take 325 mg by mouth every 6 (six) hours as needed.  Marland Kitchen aspirin EC 81 MG tablet Take 81 mg by mouth daily. Swallow whole.  . celecoxib (CELEBREX) 200 MG capsule Take 1 capsule (200 mg total) by mouth 2 (two) times daily.  Marland Kitchen losartan-hydrochlorothiazide (HYZAAR) 50-12.5 MG tablet Take 1 tablet by mouth daily. (Patient taking differently: Take 1 tablet by mouth at bedtime.)  . enoxaparin (LOVENOX) 40 MG/0.4ML injection Inject 0.4 mLs (40 mg total) into the skin daily for 14 days.  . [DISCONTINUED] amLODipine (NORVASC) 5  MG tablet Take 1 tablet (5 mg total) by mouth daily. (Patient not taking: Reported on 05/09/2020)   No facility-administered medications prior to visit.    Review of Systems  Constitutional: Negative for appetite change, chills, fatigue and fever.   Respiratory: Negative for cough, chest tightness, shortness of breath and wheezing.   Cardiovascular: Negative for chest pain, palpitations and leg swelling.  Musculoskeletal: Positive for arthralgias and gait problem (3 weeks s/p left hip replacement).    Last CBC Lab Results  Component Value Date   WBC 6.9 04/10/2020   HGB 11.5 (L) 04/10/2020   HCT 34.0 (L) 04/10/2020   MCV 91.6 04/10/2020   MCH 31.0 04/10/2020   RDW 12.9 04/10/2020   PLT 208 78/29/5621   Last metabolic panel Lab Results  Component Value Date   GLUCOSE 99 04/10/2020   NA 139 04/10/2020   K 3.7 04/10/2020   CL 105 04/10/2020   CO2 27 04/10/2020   BUN 26 (H) 04/10/2020   CREATININE 0.93 04/10/2020   GFRNONAA >60 04/10/2020   GFRAA 47 (L) 03/17/2020   CALCIUM 9.0 04/10/2020   PROT 7.5 04/10/2020   ALBUMIN 4.0 04/10/2020   LABGLOB 2.5 03/17/2020   AGRATIO 1.7 03/17/2020   BILITOT 0.9 04/10/2020   ALKPHOS 79 04/10/2020   AST 20 04/10/2020   ALT 12 04/10/2020   ANIONGAP 7 04/10/2020   Last lipids Lab Results  Component Value Date   CHOL 152 03/17/2020   HDL 41 03/17/2020   LDLCALC 88 03/17/2020   TRIG 130 03/17/2020   CHOLHDL 3.4 03/15/2019      Objective    Wt 116 lb (52.6 kg)   BMI 20.55 kg/m  BP Readings from Last 3 Encounters:  04/21/20 (!) 155/61  04/11/20 (!) 160/84  04/10/20 (!) 161/86   Wt Readings from Last 3 Encounters:  05/09/20 116 lb (52.6 kg)  04/19/20 121 lb 14.6 oz (55.3 kg)  04/11/20 122 lb (55.3 kg)       Assessment & Plan     1. Primary hypertension Improved after hip replacement surgery. Has been able to stop amlodipine. Home readings have been 120-130s/60-70s. Continue Losartan-HCTZ 50-12.5mg  daily.    No follow-ups on file.    I discussed the assessment and treatment plan with the patient. The patient was provided an opportunity to ask questions and all were answered. The patient agreed with the plan and demonstrated an understanding of the  instructions.   The patient was advised to call back or seek an in-person evaluation if the symptoms worsen or if the condition fails to improve as anticipated.  I provided 12 minutes of non-face-to-face time during this encounter.  [name], PA-C, have reviewed all documentation for this visit. The documentation on 05/09/20 for the exam, diagnosis, procedures, and orders are all accurate and complete.   [name] [facility] [phone] (phone) [phone] (fax)  [place]

## 2020-05-09 NOTE — Patient Instructions (Signed)

## 2020-08-01 ENCOUNTER — Other Ambulatory Visit: Payer: Self-pay | Admitting: Physician Assistant

## 2020-08-01 DIAGNOSIS — I10 Essential (primary) hypertension: Secondary | ICD-10-CM

## 2020-09-27 ENCOUNTER — Telehealth: Payer: Medicare PPO | Admitting: Urology

## 2020-09-27 DIAGNOSIS — N200 Calculus of kidney: Secondary | ICD-10-CM

## 2020-09-27 NOTE — Telephone Encounter (Signed)
PATIENT NEEDS AN ORDER FOR A RUS PLEASE  THANKS, MB

## 2020-10-13 ENCOUNTER — Ambulatory Visit
Admission: RE | Admit: 2020-10-13 | Discharge: 2020-10-13 | Disposition: A | Discharge status: Home / Self Care | Payer: Medicare PPO | Source: Ambulatory Visit | Attending: Urology | Admitting: Urology

## 2020-10-13 ENCOUNTER — Other Ambulatory Visit: Payer: Self-pay

## 2020-10-13 DIAGNOSIS — N200 Calculus of kidney: Secondary | ICD-10-CM | POA: Diagnosis present

## 2020-10-18 ENCOUNTER — Ambulatory Visit: Payer: Medicare PPO | Admitting: Urology

## 2020-10-18 ENCOUNTER — Other Ambulatory Visit: Payer: Self-pay

## 2020-10-18 VITALS — BP 168/80 | HR 80 | Ht 63.0 in | Wt 120.0 lb

## 2020-10-18 DIAGNOSIS — N200 Calculus of kidney: Secondary | ICD-10-CM | POA: Diagnosis not present

## 2020-10-18 NOTE — Progress Notes (Signed)
10/18/2020 11:06 AM   Gina Middleton 1935/04/28 [id number]  Referring provider: [name], PA-C [place] [address],  [place] [address]  Chief Complaint  Patient presents with   Other    Urologic history: 1.  Nephrolithiasis -CT 03/2018 w/ 2-2 mm nonobstructing right renal calculi -Status post ureteroscopic removal 3 mm left distal calculus   HPI: 85 y.o. female presents for annual follow-up.  Since last years visit she denies flank/abdominal pain or renal colic Periodically has mild dysuria which will last 1-2 days then resolved and she wonders if she may be passing a small stone when the symptoms occur Denies gross hematuria Renal ultrasound performed 10/13/2020 showed a 3.5 cm right simple renal cyst.  No urinary tract calculi or identified in either kidney   PMH: Past Medical History:  Diagnosis Date   Actinic keratosis 10/08/2012   Left mid to distal tricep    Arthritis    Basal cell carcinoma 07/21/2018   Left inferior antihelix   Complication of anesthesia    History of kidney stones    Hypertension    Pneumonia 03/2017   PONV (postoperative nausea and vomiting) 1980's   n/v with lap choley and n/v after 08-2019 hip surgery    Surgical History: Past Surgical History:  Procedure Laterality Date   [place] Right 12/22/2019   Procedure: CARPAL TUNNEL RELEASE;  Surgeon: Dereck Leep, MD;  Location: [facility] ORS;  Service: Orthopedics;  Laterality: Right;   CHOLECYSTECTOMY     CYSTOSCOPY W/ RETROGRADES Left 04/09/2018   Procedure: CYSTOSCOPY WITH RETROGRADE PYELOGRAM;  Surgeon: Abbie Sons, MD;  Location: [facility] OR[facility];  Service: Urology;  Laterality: Left;   CYSTOSCOPY WITH STENT PLACEMENT Left 04/09/2018   Procedure: CYSTOSCOPY WITH STENT PLACEMENT-LEFT;  Surgeon: Abbie Sons, MD;  Location: [facility];  Service: Urology;  Laterality: Left;   CYSTOSCOPY WITH URETEROSCOPY Left  04/09/2018   Procedure: POSSIBLE URETEROSCOPY-LEFT;  Surgeon: Abbie Sons, MD;  Location: [facility];  Service: Urology;  Laterality: Left;   TOTAL HIP ARTHROPLASTY Right 08/25/2019   Procedure: TOTAL HIP ARTHROPLASTY;  Surgeon: Dereck Leep, MD;  Location: [facility];  Service: Orthopedics;  Laterality: Right;   TOTAL HIP ARTHROPLASTY Left 04/19/2020   Procedure: TOTAL HIP ARTHROPLASTY;  Surgeon: Dereck Leep, MD;  Location: [facility];  Service: Orthopedics;  Laterality: Left;    Home Medications:  Allergies as of 10/18/2020       Reactions   Other Anaphylaxis   A muscle relaxer in the 70s but cannot remember name of the medication that caused it   Hydrocodone-acetaminophen Other (See Comments)   dizziness   Lisinopril    Hair thinning   Oxycodone-acetaminophen Nausea And Vomiting   Iodinated Diagnostic Agents Rash        Medication List        Accurate as of October 18, 2020 11:06 AM. If you have any questions, ask your nurse or doctor.          acetaminophen 325 MG tablet Commonly known as: TYLENOL Take 325 mg by mouth every 6 (six) hours as needed.   aspirin EC 81 MG tablet Take 81 mg by mouth daily. Swallow whole.   celecoxib 200 MG capsule Commonly known as: CELEBREX Take 1 capsule (200 mg total) by mouth 2 (two) times daily.   enoxaparin 40 MG/0.4ML injection Commonly known as: LOVENOX Inject 0.4 mLs (40  mg total) into the skin daily for 14 days.   losartan-hydrochlorothiazide 50-12.5 MG tablet Commonly known as: HYZAAR TAKE 1 TABLET BY MOUTH DAILY        Allergies:  Allergies  Allergen Reactions   Other Anaphylaxis    A muscle relaxer in the 70s but cannot remember name of the medication that caused it   Hydrocodone-Acetaminophen Other (See Comments)    dizziness   Lisinopril     Hair thinning   Oxycodone-Acetaminophen Nausea And Vomiting   Iodinated Diagnostic Agents Rash    Family History: Family History  Problem Relation Age of Onset    Hypertension Mother    Alzheimer's disease Mother    Heart attack Father        x's 3   Diabetes Sister    Heart attack Sister    Breast cancer Maternal Grandmother     Social History:  reports that she has never smoked. She has never used smokeless tobacco. She reports that she does not currently use alcohol. She reports that she does not use drugs.   Physical Exam: BP (!) 168/80   Pulse 80   Ht '5\' 3"'$  (1.6 m)   Wt 120 lb (54.4 kg)   BMI 21.26 kg/m   Constitutional:  Alert and oriented, No acute distress. HEENT: [place] AT, moist mucus membranes.  Trachea midline, no masses. Cardiovascular: No clubbing, cyanosis, or edema. Respiratory: Normal respiratory effort, no increased work of breathing. GI: Abdomen is soft, nontender, nondistended, no abdominal masses GU: No CVA tenderness Lymph: No cervical or inguinal lymphadenopathy. Skin: No rashes, bruises or suspicious lesions. Neurologic: Grossly intact, no focal deficits, moving all 4 extremities. Psychiatric: Normal mood and affect.   Assessment & Plan:    1.  Nephrolithiasis Renal ultrasound showed no urinary tract calculi though we discussed that she could have punctate renal calculi that would not be visualized on ultrasound Continue annual follow-up KUB 1 year Call earlier for any persistent voiding symptoms or flank pain   Abbie Sons, MD  [facility] [address] [place], [place] [address] [phone]

## 2020-10-19 ENCOUNTER — Encounter: Payer: Self-pay | Admitting: Urology

## 2020-10-19 ENCOUNTER — Encounter: Payer: Medicare PPO | Admitting: Dermatology

## 2021-02-15 ENCOUNTER — Other Ambulatory Visit: Payer: Self-pay | Admitting: Family Medicine

## 2021-02-15 DIAGNOSIS — I10 Essential (primary) hypertension: Secondary | ICD-10-CM

## 2021-02-15 NOTE — Telephone Encounter (Signed)
Last Refill: 08/03/20 Qty:90 R:1 LOV:05/09/2020 Future appointments: with [facility] 03/21/21 No follow ups appointment schedule.

## 2021-03-12 ENCOUNTER — Ambulatory Visit: Payer: Self-pay

## 2021-03-12 ENCOUNTER — Telehealth (INDEPENDENT_AMBULATORY_CARE_PROVIDER_SITE_OTHER): Payer: Medicare PPO | Admitting: Family Medicine

## 2021-03-12 ENCOUNTER — Encounter: Payer: Self-pay | Admitting: Family Medicine

## 2021-03-12 DIAGNOSIS — U071 COVID-19: Secondary | ICD-10-CM

## 2021-03-12 MED ORDER — NIRMATRELVIR/RITONAVIR (PAXLOVID)TABLET: 3.0000 | ORAL_TABLET | Freq: Two times a day (BID) | ORAL | 0 refills | Status: AC

## 2021-03-12 NOTE — Progress Notes (Signed)
Virtual Visit via Phone Note  I connected with Gina Middleton on 03/12/21 at 11:40 AM EST by phone and verified that I am speaking with the correct person using two identifiers.  Location: Patient: home Provider: [facility]   I discussed the limitations of evaluation and management by telemedicine and the availability of in person appointments. The patient expressed understanding and agreed to proceed.  History of Present Illness:  UPPER RESPIRATORY TRACT INFECTION - symptom onset 1/21 - husband in the hospital with COVID - COVID+ this morning. - has received 2 doses of mRNA vaccine.  Fever: yes Cough: yes Shortness of breath: no Chest pain: no Chest tightness: no Chest congestion: no Nasal congestion: yes Sore throat: yes Sinus pressure: yes Headache: yes Diarrhea: yes Vomiting: yes Sick contacts: yes Relief with OTC cold/cough medications:  hasn't tried   Treatments attempted: tylenol    Observations/Objective:  Patient had trouble connecting to video visit, entirety of visit conducted over the phone.  Speaks in full sentences, no respiratory distress. Congested sounding.  Assessment and Plan:  COVID-19 Moderate sx. Is a candidate for COVID treatment, paxlovid sent to pharmacy. Reviewed OTC symptom relief, self-quarantine guidelines, and emergency precautions.      I discussed the assessment and treatment plan with the patient. The patient was provided an opportunity to ask questions and all were answered. The patient agreed with the plan and demonstrated an understanding of the instructions.   The patient was advised to call back or seek an in-person evaluation if the symptoms worsen or if the condition fails to improve as anticipated.  I provided 10 minutes of non-face-to-face time during this encounter.   Myles Gip, DO

## 2021-03-12 NOTE — Patient Instructions (Signed)
It was great to see you!  Our plans for today:  - See below for self-isolation guidelines. You may end your quarantine once you are 10 days from symptom onset and fever free for 24 hours without use of tylenol or ibuprofen. Wear a well-fitting N95 mask if you have to go out and about. - Take the paxlovid as directed. See HotterNames.[email or web address] for more information about the medication. - I recommend getting vaccinated with your booster once you are healed from your current infection. - Certainly, if you are having difficulties breathing or unable to keep down fluids, go to the Emergency Department.   Take care and seek immediate care sooner if you develop any concerns.   Dr. Ky Barban     Person Under Monitoring Name: Gina Middleton  Location: [place] [address]   Infection Prevention Recommendations for Individuals Confirmed to have, or Being Evaluated for, 2019 Novel Coronavirus (COVID-19) Infection Who Receive Care at Home  Individuals who are confirmed to have, or are being evaluated for, COVID-19 should follow the prevention steps below until a healthcare provider or local or state health department says they can return to normal activities.  Stay home except to get medical care You should restrict activities outside your home, except for getting medical care. Do not go to work, school, or public areas, and do not use public transportation or taxis.  Call ahead before visiting your doctor Before your medical appointment, call the healthcare provider and tell them that you have, or are being evaluated for, COVID-19 infection. This will help the healthcare providers office take steps to keep other people from getting infected. Ask your healthcare provider to call the local or state health department.  Monitor your symptoms Seek prompt medical attention if your illness is worsening (e.g., difficulty breathing). Before  going to your medical appointment, call the healthcare provider and tell them that you have, or are being evaluated for, COVID-19 infection. Ask your healthcare provider to call the local or state health department.  Wear a facemask You should wear a facemask that covers your nose and mouth when you are in the same room with other people and when you visit a healthcare provider. People who live with or visit you should also wear a facemask while they are in the same room with you.  Separate yourself from other people in your home As much as possible, you should stay in a different room from other people in your home. Also, you should use a separate bathroom, if available.  Avoid sharing household items You should not share dishes, drinking glasses, cups, eating utensils, towels, bedding, or other items with other people in your home. After using these items, you should wash them thoroughly with soap and water.  Cover your coughs and sneezes Cover your mouth and nose with a tissue when you cough or sneeze, or you can cough or sneeze into your sleeve. Throw used tissues in a lined trash can, and immediately wash your hands with soap and water for at least 20 seconds or use an alcohol-based hand rub.  Wash your [facility] your hands often and thoroughly with soap and water for at least 20 seconds. You can use an alcohol-based hand sanitizer if soap and water are not available and if your hands are not visibly dirty. Avoid touching your eyes, nose, and mouth with unwashed hands.   Prevention Steps for Caregivers and Household Members of Individuals Confirmed to have, or Being Evaluated for,  COVID-19 Infection Being Cared for in the Home  If you live with, or provide care at home for, a person confirmed to have, or being evaluated for, COVID-19 infection please follow these guidelines to prevent infection:  Follow healthcare providers instructions Make sure that you understand and can  help the patient follow any healthcare provider instructions for all care.  Provide for the patients basic needs You should help the patient with basic needs in the home and provide support for getting groceries, prescriptions, and other personal needs.  Monitor the patients symptoms If they are getting sicker, call his or her medical provider and tell them that the patient has, or is being evaluated for, COVID-19 infection. This will help the healthcare providers office take steps to keep other people from getting infected. Ask the healthcare provider to call the local or state health department.  Limit the number of people who have contact with the patient If possible, have only one caregiver for the patient. Other household members should stay in another home or place of residence. If this is not possible, they should stay in another room, or be separated from the patient as much as possible. Use a separate bathroom, if available. Restrict visitors who do not have an essential need to be in the home.  Keep older adults, very young children, and other sick people away from the patient Keep older adults, very young children, and those who have compromised immune systems or chronic health conditions away from the patient. This includes people with chronic heart, lung, or kidney conditions, diabetes, and cancer.  Ensure good ventilation Make sure that shared spaces in the home have good air flow, such as from an air conditioner or an opened window, weather permitting.  Wash your hands often Wash your hands often and thoroughly with soap and water for at least 20 seconds. You can use an alcohol based hand sanitizer if soap and water are not available and if your hands are not visibly dirty. Avoid touching your eyes, nose, and mouth with unwashed hands. Use disposable paper towels to dry your hands. If not available, use dedicated cloth towels and replace them when they become wet.  Wear  a facemask and gloves Wear a disposable facemask at all times in the room and gloves when you touch or have contact with the patients blood, body fluids, and/or secretions or excretions, such as sweat, saliva, sputum, nasal mucus, vomit, urine, or feces.  Ensure the mask fits over your nose and mouth tightly, and do not touch it during use. Throw out disposable facemasks and gloves after using them. Do not reuse. Wash your hands immediately after removing your facemask and gloves. If your personal clothing becomes contaminated, carefully remove clothing and launder. Wash your hands after handling contaminated clothing. Place all used disposable facemasks, gloves, and other waste in a lined container before disposing them with other household waste. Remove gloves and wash your hands immediately after handling these items.  Do not share dishes, glasses, or other household items with the patient Avoid sharing household items. You should not share dishes, drinking glasses, cups, eating utensils, towels, bedding, or other items with a patient who is confirmed to have, or being evaluated for, COVID-19 infection. After the person uses these items, you should wash them thoroughly with soap and water.  Wash laundry thoroughly Immediately remove and wash clothes or bedding that have blood, body fluids, and/or secretions or excretions, such as sweat, saliva, sputum, nasal mucus, vomit, urine, or  feces, on them. Wear gloves when handling laundry from the patient. Read and follow directions on labels of laundry or clothing items and detergent. In general, wash and dry with the warmest temperatures recommended on the label.  Clean all areas the individual has used often Clean all touchable surfaces, such as counters, tabletops, doorknobs, bathroom fixtures, toilets, phones, keyboards, tablets, and bedside tables, every day. Also, clean any surfaces that may have blood, body fluids, and/or secretions or  excretions on them. Wear gloves when cleaning surfaces the patient has come in contact with. Use a diluted bleach solution (e.g., dilute bleach with 1 part bleach and 10 parts water) or a household disinfectant with a label that says EPA-registered for coronaviruses. To make a bleach solution at home, add 1 tablespoon of bleach to 1 quart (4 cups) of water. For a larger supply, add  cup of bleach to 1 gallon (16 cups) of water. Read labels of cleaning products and follow recommendations provided on product labels. Labels contain instructions for safe and effective use of the cleaning product including precautions you should take when applying the product, such as wearing gloves or eye protection and making sure you have good ventilation during use of the product. Remove gloves and wash hands immediately after cleaning.  Monitor yourself for signs and symptoms of illness Caregivers and household members are considered close contacts, should monitor their health, and will be asked to limit movement outside of the home to the extent possible. Follow the monitoring steps for close contacts listed on the symptom monitoring form.   ? If you have additional questions, contact your local health department or call the epidemiologist on call at [phone] (available 24/7). ? This guidance is subject to change. For the most up-to-date guidance from [facility], please refer to their website: [email or web address]

## 2021-03-12 NOTE — Telephone Encounter (Signed)
Pt is calling to report that she has been feeling sick since Saturday - temperature of 101, body aches, chills, sore throat, and cough with Phelm, congestion. With a negative COVID test on Saturday. Pt thinks that the test my. Pts husband was in the hospital with [place]. He was released on Saturday afternoon. Virtual appt available and scheduled for tomorrow. Please advise      Chief Complaint: Productive cough, fever, chills, headache, sore throat Symptoms: Home COVID test negative Frequency: Started Saturday.  Pertinent Negatives: Patient denies shortness of breath Disposition: [] ED /[] Urgent Care (no appt availability in office) / [x] Appointment(In office/virtual)/ []  [facility]/ [] Home Care/ [] Refused Recommended Disposition /[] [facility]/ []  Follow-up with PCP Additional Notes:   Reason for Disposition  [1] Fever > 101 F (38.3 C) AND [2] age > 60 years  Answer Assessment - Initial Assessment Questions 1. ONSET: "When did the cough begin?"      Saturday 2. SEVERITY: "How bad is the cough today?"      Moderate 3. SPUTUM: "Describe the color of your sputum" (none, dry cough; clear, white, yellow, green)     Yellow 4. HEMOPTYSIS: "Are you coughing up any blood?" If so ask: "How much?" (flecks, streaks, tablespoons, etc.)     No 5. DIFFICULTY BREATHING: "Are you having difficulty breathing?" If Yes, ask: "How bad is it?" (e.g., mild, moderate, severe)    - MILD: No SOB at rest, mild SOB with walking, speaks normally in sentences, can lie down, no retractions, pulse < 100.    - MODERATE: SOB at rest, SOB with minimal exertion and prefers to sit, cannot lie down flat, speaks in phrases, mild retractions, audible wheezing, pulse 100-120.    - SEVERE: Very SOB at rest, speaks in single words, struggling to breathe, sitting hunched forward, retractions, pulse > 120      No 6. FEVER: "Do you have a fever?" If Yes, ask: "What is your temperature, how was it measured, and  when did it start?"     101 7. CARDIAC HISTORY: "Do you have any history of heart disease?" (e.g., heart attack, congestive heart failure)      No 8. LUNG HISTORY: "Do you have any history of lung disease?"  (e.g., pulmonary embolus, asthma, emphysema)     No 9. PE RISK FACTORS: "Do you have a history of blood clots?" (or: recent major surgery, recent prolonged travel, bedridden)     No 10. OTHER SYMPTOMS: "Do you have any other symptoms?" (e.g., runny nose, wheezing, chest pain)       Headache, sore throat, chills 11. PREGNANCY: "Is there any chance you are pregnant?" "When was your last menstrual period?"       No 12. TRAVEL: "Have you traveled out of the country in the last month?" (e.g., travel history, exposures)       No  Protocols used: Cough - Acute Productive-A-AH

## 2021-03-21 ENCOUNTER — Encounter: Payer: Medicare PPO | Admitting: Family Medicine

## 2021-04-10 NOTE — Progress Notes (Signed)
Complete physical exam   Patient: Gina Middleton   DOB: 03/05/1935   86 y.o. Female  MRN: [id number] Visit Date: 04/11/2021  Today's healthcare provider: Gwyneth Sprout, FNP   Chief Complaint  Patient presents with   Annual Exam   I,Sulibeya S Dimas,acting as a scribe for Gwyneth Sprout, FNP.,have documented all relevant documentation on the behalf of Gwyneth Sprout, FNP,as directed by  Gwyneth Sprout, FNP while in the presence of Gwyneth Sprout, FNP.  Subjective    Gina Middleton is a 86 y.o. female who presents today for a complete physical exam.  She reports consuming a general diet. Home exercise routine includes stretching. She generally feels well. She reports sleeping well. She does not have additional problems to discuss today.  HPI  04/11/21 Annual wellness exam with LPN  Past Medical History:  Diagnosis Date   Actinic keratosis 10/08/2012   Left mid to distal tricep    Arthritis    Basal cell carcinoma 07/21/2018   Left inferior antihelix   Complication of anesthesia    History of kidney stones    Hypertension    Pneumonia 03/2017   PONV (postoperative nausea and vomiting) 1980's   n/v with lap choley and n/v after 08-2019 hip surgery   Past Surgical History:  Procedure Laterality Date   [place] Right 12/22/2019   Procedure: CARPAL TUNNEL RELEASE;  Surgeon: Dereck Leep, MD;  Location: [facility];  Service: Orthopedics;  Laterality: Right;   CHOLECYSTECTOMY     CYSTOSCOPY W/ RETROGRADES Left 04/09/2018   Procedure: CYSTOSCOPY WITH RETROGRADE PYELOGRAM;  Surgeon: Abbie Sons, MD;  Location: [facility];  Service: Urology;  Laterality: Left;   CYSTOSCOPY WITH STENT PLACEMENT Left 04/09/2018   Procedure: CYSTOSCOPY WITH STENT PLACEMENT-LEFT;  Surgeon: Abbie Sons, MD;  Location: [facility];  Service: Urology;  Laterality: Left;   CYSTOSCOPY WITH URETEROSCOPY Left 04/09/2018   Procedure: POSSIBLE  URETEROSCOPY-LEFT;  Surgeon: Abbie Sons, MD;  Location: [facility];  Service: Urology;  Laterality: Left;   TOTAL HIP ARTHROPLASTY Right 08/25/2019   Procedure: TOTAL HIP ARTHROPLASTY;  Surgeon: Dereck Leep, MD;  Location: [facility];  Service: Orthopedics;  Laterality: Right;   TOTAL HIP ARTHROPLASTY Left 04/19/2020   Procedure: TOTAL HIP ARTHROPLASTY;  Surgeon: Dereck Leep, MD;  Location: [facility];  Service: Orthopedics;  Laterality: Left;   Social History   Socioeconomic History   Marital status: Married    Spouse name: Kyung Rudd   Number of children: 0   Years of education: [employer] education level: Master's degree (e.g., MA, MS, MEng, MEd, MSW, MBA)  Occupational History   Occupation: Retired Programmer, multimedia   Occupation: Works part-time at [employer] and [employer]   Occupation: [employer]    Employer: [employer]    Comment: Part Time  Tobacco Use   Smoking status: Never   Smokeless tobacco: Never  Vaping Use   Vaping Use: Never used  Substance and Sexual Activity   Alcohol use: Not Currently   Drug use: No   Sexual activity: Never  Other Topics Concern   Not on file  Social History Narrative   Not on file   Social Determinants of Health   Financial Resource Strain: Not on file  Food Insecurity: No Food Insecurity   Worried About [place] in the Last Year:  Never true   Ran Out of Food in the Last Year: Never true  Transportation Needs: No Transportation Needs   Lack of Transportation (Medical): No   Lack of Transportation (Non-Medical): No  Physical Activity: Sufficiently Active   Days of Exercise per Week: 7 days   Minutes of Exercise per Session: 60 min  Stress: No Stress Concern Present   Feeling of Stress : Not at all  Social Connections: Moderately Isolated   Frequency of Communication with Friends and Family: More than three times a week   Frequency of Social Gatherings with Friends and Family: Twice a week   Attends  Religious Services: Never   Marine scientist or Organizations: No   Attends Music therapist: Never   Marital Status: Married  Human resources officer Violence: Not At Risk   Fear of Current or Ex-Partner: No   Emotionally Abused: No   Physically Abused: No   Sexually Abused: No   Family Status  Relation Name Status   Mother  Deceased   Father  Deceased       Died after his third heart attack   Sister  Deceased       Died from a heart attack   MGM  Deceased   Family History  Problem Relation Age of Onset   Hypertension Mother    Alzheimer's disease Mother    Heart attack Father        x's 3   Diabetes Sister    Heart attack Sister    Breast cancer Maternal Grandmother    Allergies  Allergen Reactions   Other Anaphylaxis    A muscle relaxer in the 70s but cannot remember name of the medication that caused it   Hydrocodone-Acetaminophen Other (See Comments)    dizziness   Lisinopril     Hair thinning   Oxycodone-Acetaminophen Nausea And Vomiting   Iodinated Contrast Media Rash    Patient Care Team: Gwyneth Sprout, FNP as PCP - General (Family Medicine) Ralene Bathe, MD (Dermatology) Abbie Sons, MD (Urology) Eulogio Bear, MD as Consulting Physician (Ophthalmology) Marry Guan Laurice Record, MD (Orthopedic Surgery)   Medications: Outpatient Medications Prior to Visit  Medication Sig   acetaminophen (TYLENOL) 325 MG tablet Take 325 mg by mouth every 6 (six) hours as needed.   aspirin EC 81 MG tablet Take 81 mg by mouth daily. Swallow whole.   [DISCONTINUED] losartan-hydrochlorothiazide (HYZAAR) 50-12.5 MG tablet Take 1 tablet by mouth daily. Please schedule office visit with new provider, before any future refills.   [DISCONTINUED] amoxicillin (AMOXIL) 500 MG capsule Take by mouth. (Patient not taking: Reported on 04/11/2021)   No facility-administered medications prior to visit.    Review of Systems  Constitutional:  Positive for fatigue.   Respiratory:  Positive for cough.   All other systems reviewed and are negative.  Last CBC Lab Results  Component Value Date   WBC 6.9 04/10/2020   HGB 11.5 (L) 04/10/2020   HCT 34.0 (L) 04/10/2020   MCV 91.6 04/10/2020   MCH 31.0 04/10/2020   RDW 12.9 04/10/2020   PLT 208 27/74/1287   Last metabolic panel Lab Results  Component Value Date   GLUCOSE 99 04/10/2020   NA 139 04/10/2020   K 3.7 04/10/2020   CL 105 04/10/2020   CO2 27 04/10/2020   BUN 26 (H) 04/10/2020   CREATININE 0.93 04/10/2020   GFRNONAA >60 04/10/2020   CALCIUM 9.0 04/10/2020   PROT 7.5 04/10/2020  ALBUMIN 4.0 04/10/2020   LABGLOB 2.5 03/17/2020   AGRATIO 1.7 03/17/2020   BILITOT 0.9 04/10/2020   ALKPHOS 79 04/10/2020   AST 20 04/10/2020   ALT 12 04/10/2020   ANIONGAP 7 04/10/2020   Last lipids Lab Results  Component Value Date   CHOL 152 03/17/2020   HDL 41 03/17/2020   LDLCALC 88 03/17/2020   TRIG 130 03/17/2020   CHOLHDL 3.4 03/15/2019   Last hemoglobin A1c Lab Results  Component Value Date   HGBA1C 5.4 03/17/2020   Last thyroid functions Lab Results  Component Value Date   TSH 1.720 02/27/2018   Last vitamin D Lab Results  Component Value Date   VD25OH 28.6 (L) 02/27/2018   Last vitamin B12 and Folate Lab Results  Component Value Date   VITAMINB12 711 07/10/2017      Objective    BP (!) 150/80 (Patient Position: Sitting, Cuff Size: Large)    Pulse 78    Temp 98.6 F (37 C) (Oral)    Resp 16    Ht 5\' 3"  (1.6 m)    Wt 136 lb 12.8 oz (62.1 kg)    SpO2 97%    BMI 24.23 kg/m  BP Readings from Last 3 Encounters:  04/11/21 (!) 150/80  04/11/21 (!) 150/80  10/18/20 (!) 168/80   Wt Readings from Last 3 Encounters:  04/11/21 136 lb 12.8 oz (62.1 kg)  10/18/20 120 lb (54.4 kg)  05/09/20 116 lb (52.6 kg)      Physical Exam Vitals and nursing note reviewed.  Constitutional:      General: She is awake. She is not in acute distress.    Appearance: Normal appearance. She  is well-developed, well-groomed and normal weight. She is not ill-appearing, toxic-appearing or diaphoretic.  HENT:     Head: Normocephalic and atraumatic.     Jaw: There is normal jaw occlusion. No trismus, tenderness, swelling or pain on movement.     Right Ear: Hearing, tympanic membrane, ear canal and external ear normal. There is no impacted cerumen.     Left Ear: Hearing, tympanic membrane, ear canal and external ear normal. There is no impacted cerumen.     Nose: Nose normal. No congestion or rhinorrhea.     Right Turbinates: Not enlarged, swollen or pale.     Left Turbinates: Not enlarged, swollen or pale.     Right Sinus: No maxillary sinus tenderness or frontal sinus tenderness.     Left Sinus: No maxillary sinus tenderness or frontal sinus tenderness.     Mouth/Throat:     Lips: Pink.     Mouth: Mucous membranes are moist. No injury.     Tongue: No lesions.     Pharynx: Oropharynx is clear. Uvula midline. No pharyngeal swelling, oropharyngeal exudate, posterior oropharyngeal erythema or uvula swelling.     Tonsils: No tonsillar exudate or tonsillar abscesses.  Eyes:     General: Lids are normal. Lids are everted, no foreign bodies appreciated. Vision grossly intact. Gaze aligned appropriately. No allergic shiner or visual field deficit.       Right eye: No discharge.        Left eye: No discharge.     Extraocular Movements: Extraocular movements intact.     Conjunctiva/sclera: Conjunctivae normal.     Right eye: Right conjunctiva is not injected. No exudate.    Left eye: Left conjunctiva is not injected. No exudate.    Pupils: Pupils are equal, round, and reactive to light.  Neck:  Thyroid: No thyroid mass, thyromegaly or thyroid tenderness.     Vascular: No carotid bruit.     Trachea: Trachea normal.  Cardiovascular:     Rate and Rhythm: Normal rate and regular rhythm.     Pulses: Normal pulses.          Carotid pulses are 2+ on the right side and 2+ on the left  side.      Radial pulses are 2+ on the right side and 2+ on the left side.       Dorsalis pedis pulses are 2+ on the right side and 2+ on the left side.       Posterior tibial pulses are 2+ on the right side and 2+ on the left side.     Heart sounds: Normal heart sounds, S1 normal and S2 normal. No murmur heard.   No friction rub. No gallop.  Pulmonary:     Effort: Pulmonary effort is normal. No respiratory distress.     Breath sounds: Normal breath sounds and air entry. No stridor. No wheezing, rhonchi or rales.  Chest:     Chest wall: No tenderness.     Comments: Breast exam deferred; discussed 'know your lemons' campaign and self exam Abdominal:     General: Abdomen is flat. Bowel sounds are normal. There is no distension.     Palpations: Abdomen is soft. There is no mass.     Tenderness: There is no abdominal tenderness. There is no right CVA tenderness, left CVA tenderness, guarding or rebound.     Hernia: No hernia is present.  Genitourinary:    Comments: Exam deferred; denies complaints Musculoskeletal:        General: No swelling, tenderness, deformity or signs of injury. Normal range of motion.     Cervical back: Full passive range of motion without pain, normal range of motion and neck supple. No edema, rigidity or tenderness. No muscular tenderness.     Right lower leg: No edema.     Left lower leg: No edema.  Lymphadenopathy:     Cervical: No cervical adenopathy.     Right cervical: No superficial, deep or posterior cervical adenopathy.    Left cervical: No superficial, deep or posterior cervical adenopathy.  Skin:    General: Skin is warm and dry.     Capillary Refill: Capillary refill takes less than 2 seconds.     Coloration: Skin is not jaundiced or pale.     Findings: No bruising, erythema, lesion or rash.  Neurological:     General: No focal deficit present.     Mental Status: She is alert and oriented to person, place, and time. Mental status is at baseline.      GCS: GCS eye subscore is 4. GCS verbal subscore is 5. GCS motor subscore is 6.     Sensory: Sensation is intact. No sensory deficit.     Motor: Motor function is intact. No weakness.     Coordination: Coordination is intact. Coordination normal.     Gait: Gait is intact. Gait normal.  Psychiatric:        Attention and Perception: Attention and perception normal.        Mood and Affect: Mood and affect normal.        Speech: Speech normal.        Behavior: Behavior normal. Behavior is cooperative.        Thought Content: Thought content normal.        Cognition and Memory:  Cognition and memory normal.        Judgment: Judgment normal.     Last depression screening scores PHQ 2/9 Scores 04/11/2021 04/11/2020 03/17/2020  PHQ - 2 Score 0 0 0  PHQ- 9 Score - 0 -   Last fall risk screening Fall Risk  04/11/2021  Falls in the past year? 0  Number falls in past yr: 0  Injury with Fall? 0  Risk for fall due to : No Fall Risks  Follow up Falls evaluation completed   Last Audit-C alcohol use screening Alcohol Use Disorder Test (AUDIT) 04/11/2021  1. How often do you have a drink containing alcohol? 0  2. How many drinks containing alcohol do you have on a typical day when you are drinking? 0  3. How often do you have six or more drinks on one occasion? 0  AUDIT-C Score 0  Alcohol Brief Interventions/Follow-up -   A score of 3 or more in women, and 4 or more in men indicates increased risk for alcohol abuse, EXCEPT if all of the points are from question 1   No results found for any visits on 04/11/21.  Assessment & Plan    Routine Health Maintenance and Physical Exam  Exercise Activities and Dietary recommendations  Goals      DIET - EAT MORE FRUITS AND VEGETABLES     DIET - INCREASE WATER INTAKE     Recommend to drink at least 6-8 8oz glasses of water per day.        Immunization History  Administered Date(s) Administered   PFIZER(Purple Top)SARS-COV-2 Vaccination 11/18/2019,  12/09/2019   Pneumococcal Conjugate-13 08/28/2016   Pneumococcal Polysaccharide-23 09/12/2017   Td 05/10/1995    Health Maintenance  Topic Date Due   Zoster Vaccines- Shingrix (1 of 2) Never done   TETANUS/TDAP  05/09/2005   COVID-19 Vaccine (3 - Pfizer risk series) 01/06/2020   DEXA SCAN  02/08/2020   INFLUENZA VACCINE  05/18/2021 (Originally 09/18/2020)   Pneumonia Vaccine 90+ Years old  Completed   HPV VACCINES  Aged Out    Discussed health benefits of physical activity, and encouraged her to engage in regular exercise appropriate for her age and condition.  Problem List Items Addressed This Visit       Cardiovascular and Mediastinum   Hypertension    The patient's BP is elevated today Refills provided on combination medication with diuretic Encourage increase however patient is hesitant Patient agreeable to start 5 mg tablet of amlodipine which she has previous refill at home Return to clinic in 4 weeks Recommend use of blood pressure monitoring at home to assist in meeting blood pressure goals      Relevant Medications   losartan-hydrochlorothiazide (HYZAAR) 50-12.5 MG tablet   amLODipine (NORVASC) 5 MG tablet   Other Relevant Orders   Comprehensive metabolic panel     Other   Annual physical exam - Primary    Patient up-to-date on dental and vision checks reports history of left retinal leak followed by Dr. Edison Pace denies dental concerns followed by Dr. Juleen China Things to do to keep yourself healthy  - Exercise at least 30-45 minutes a day, 3-4 days a week.  - Eat a low-fat diet with lots of fruits and vegetables, up to 7-9 servings per day.  - Seatbelts can save your life. Wear them always.  - Smoke detectors on every level of your home, check batteries every year.  - Eye Doctor - have an eye exam every 1-2 years  -  Safe sex - if you may be exposed to STDs, use a condom.  - Alcohol -  If you drink, do it moderately, less than 2 drinks per day.  - [place]. Choose someone to speak for you if you are not able.  - Depression is common in our stressful world.If you're feeling down or losing interest in things you normally enjoy, please come in for a visit.  - Violence - If anyone is threatening or hurting you, please call immediately.        Relevant Orders   Comprehensive metabolic panel   Lipid Panel With LDL/HDL Ratio   Avitaminosis D    History of low vitamin D levels not on supplementation recommend repeat draw today      Relevant Orders   VITAMIN D 25 Hydroxy (Vit-D Deficiency, Fractures)   B12 deficiency    History of B12 deficiency repeat lab values today likely associated with anemia      Relevant Orders   Vitamin B12   Blood glucose elevated    History of elevated blood glucose level, Check hemoglobin A1c      Relevant Orders   Hemoglobin A1c   Encounter for screening mammogram for malignant neoplasm of breast    Due for screening for breast cancer; no active complaints at this time      Relevant Orders   DG Bone Density   Iron deficiency anemia    History of iron deficiency anemia Recommend CBC as well as iron and ferritin level Encourage iron rich diet Patient denies any active bruising or poor healing      Relevant Orders   CBC with Differential/Platelet   Iron, TIBC and Ferritin Panel   Screening for osteoporosis    Osteoporosis screening ordered      Relevant Orders   MM 3D SCREEN BREAST BILATERAL     Return in about 4 weeks (around 05/09/2021) for blood pressure check.     Vonna Kotyk, FNP, have reviewed all documentation for this visit. The documentation on 04/11/21 for the exam, diagnosis, procedures, and orders are all accurate and complete.    Gwyneth Sprout, [place] [phone] (phone) [phone] (fax)  [employer]

## 2021-04-11 ENCOUNTER — Encounter: Payer: Self-pay | Admitting: Family Medicine

## 2021-04-11 ENCOUNTER — Ambulatory Visit (INDEPENDENT_AMBULATORY_CARE_PROVIDER_SITE_OTHER): Payer: Medicare PPO

## 2021-04-11 ENCOUNTER — Ambulatory Visit (INDEPENDENT_AMBULATORY_CARE_PROVIDER_SITE_OTHER): Payer: Medicare PPO | Admitting: Family Medicine

## 2021-04-11 ENCOUNTER — Other Ambulatory Visit: Payer: Self-pay

## 2021-04-11 VITALS — BP 150/80 | HR 78 | Temp 98.6°F | Ht 63.0 in

## 2021-04-11 VITALS — BP 150/80 | HR 78 | Temp 98.6°F | Resp 16 | Ht 63.0 in | Wt 136.8 lb

## 2021-04-11 DIAGNOSIS — Z0000 Encounter for general adult medical examination without abnormal findings: Secondary | ICD-10-CM

## 2021-04-11 DIAGNOSIS — Z1231 Encounter for screening mammogram for malignant neoplasm of breast: Secondary | ICD-10-CM | POA: Insufficient documentation

## 2021-04-11 DIAGNOSIS — E538 Deficiency of other specified B group vitamins: Secondary | ICD-10-CM

## 2021-04-11 DIAGNOSIS — E559 Vitamin D deficiency, unspecified: Secondary | ICD-10-CM

## 2021-04-11 DIAGNOSIS — R739 Hyperglycemia, unspecified: Secondary | ICD-10-CM | POA: Diagnosis not present

## 2021-04-11 DIAGNOSIS — I10 Essential (primary) hypertension: Secondary | ICD-10-CM | POA: Diagnosis not present

## 2021-04-11 DIAGNOSIS — D509 Iron deficiency anemia, unspecified: Secondary | ICD-10-CM | POA: Insufficient documentation

## 2021-04-11 DIAGNOSIS — Z13820 Encounter for screening for osteoporosis: Secondary | ICD-10-CM | POA: Insufficient documentation

## 2021-04-11 MED ORDER — LOSARTAN POTASSIUM-HCTZ 50-12.5 MG PO TABS: 1.0000 | ORAL_TABLET | Freq: Every day | ORAL | 3 refills | Status: DC

## 2021-04-11 MED ORDER — AMLODIPINE BESYLATE 5 MG PO TABS: 5.0000 mg | ORAL_TABLET | Freq: Every day | ORAL | 0 refills | Status: DC

## 2021-04-11 NOTE — Assessment & Plan Note (Signed)
History of B12 deficiency repeat lab values today likely associated with anemia

## 2021-04-11 NOTE — Assessment & Plan Note (Signed)
Osteoporosis screening ordered

## 2021-04-11 NOTE — Assessment & Plan Note (Signed)
The patient's BP is elevated today Refills provided on combination medication with diuretic Encourage increase however patient is hesitant Patient agreeable to start 5 mg tablet of amlodipine which she has previous refill at home Return to clinic in 4 weeks Recommend use of blood pressure monitoring at home to assist in meeting blood pressure goals

## 2021-04-11 NOTE — Progress Notes (Signed)
Subjective:   Anntoinette Temisha Murley is a 86 y.o. female who presents for Medicare Annual (Subsequent) preventive examination.  Review of Systems           Objective:    Today's Vitals   04/11/21 1345  BP: (!) 150/80  Pulse: 78  Temp: 98.6 F (37 C)  TempSrc: Oral  SpO2: 97%  Height: 5\' 3"  (1.6 m)   Body mass index is 21.26 kg/m.  Advanced Directives 04/19/2020 04/10/2020 03/15/2020 12/15/2019 08/25/2019 08/25/2019 03/11/2019  Does Patient Have a Medical Advance Directive? Yes Yes Yes Yes Yes Yes Yes  Type of Paramedic of [place];Living will - [place];Living will - Living will Living will [place];Living will  Does patient want to make changes to medical advance directive? No - Patient declined - - - No - Patient declined - -  Copy of [place] in Chart? Yes - validated most recent copy scanned in chart (See row information) - Yes - validated most recent copy scanned in chart (See row information) - - - No - copy requested    Current Medications (verified) Outpatient Encounter Medications as of 04/11/2021  Medication Sig   acetaminophen (TYLENOL) 325 MG tablet Take 325 mg by mouth every 6 (six) hours as needed.   amoxicillin (AMOXIL) 500 MG capsule Take by mouth.   aspirin 81 MG chewable tablet Chew by mouth.   aspirin EC 81 MG tablet Take 81 mg by mouth daily. Swallow whole.   losartan-hydrochlorothiazide (HYZAAR) 50-12.5 MG tablet Take 1 tablet by mouth daily. Please schedule office visit with new provider, before any future refills.   No facility-administered encounter medications on file as of 04/11/2021.    Allergies (verified) Other, Hydrocodone-acetaminophen, Lisinopril, Oxycodone-acetaminophen, and Iodinated contrast media   History: Past Medical History:  Diagnosis Date   Actinic keratosis 10/08/2012   Left mid to distal tricep    Arthritis    Basal cell carcinoma 07/21/2018    Left inferior antihelix   Complication of anesthesia    History of kidney stones    Hypertension    Pneumonia 03/2017   PONV (postoperative nausea and vomiting) 1980's   n/v with lap choley and n/v after 08-2019 hip surgery   Past Surgical History:  Procedure Laterality Date   [place] Right 12/22/2019   Procedure: CARPAL TUNNEL RELEASE;  Surgeon: Dereck Leep, MD;  Location: [facility];  Service: Orthopedics;  Laterality: Right;   CHOLECYSTECTOMY     CYSTOSCOPY W/ RETROGRADES Left 04/09/2018   Procedure: CYSTOSCOPY WITH RETROGRADE PYELOGRAM;  Surgeon: Abbie Sons, MD;  Location: [facility];  Service: Urology;  Laterality: Left;   CYSTOSCOPY WITH STENT PLACEMENT Left 04/09/2018   Procedure: CYSTOSCOPY WITH STENT PLACEMENT-LEFT;  Surgeon: Abbie Sons, MD;  Location: [facility];  Service: Urology;  Laterality: Left;   CYSTOSCOPY WITH URETEROSCOPY Left 04/09/2018   Procedure: POSSIBLE URETEROSCOPY-LEFT;  Surgeon: Abbie Sons, MD;  Location: [facility];  Service: Urology;  Laterality: Left;   TOTAL HIP ARTHROPLASTY Right 08/25/2019   Procedure: TOTAL HIP ARTHROPLASTY;  Surgeon: Dereck Leep, MD;  Location: [facility];  Service: Orthopedics;  Laterality: Right;   TOTAL HIP ARTHROPLASTY Left 04/19/2020   Procedure: TOTAL HIP ARTHROPLASTY;  Surgeon: Dereck Leep, MD;  Location: [facility];  Service: Orthopedics;  Laterality: Left;   Family History  Problem Relation Age of Onset  Hypertension Mother    Alzheimer's disease Mother    Heart attack Father        x's 3   Diabetes Sister    Heart attack Sister    Breast cancer Maternal Grandmother    Social History   Socioeconomic History   Marital status: Married    Spouse name: Kyung Rudd   Number of children: 0   Years of education: [employer] education level: Master's degree (e.g., MA, MS, MEng, MEd, MSW, MBA)  Occupational History   Occupation: Retired Programmer, multimedia    Occupation: Works part-time at [employer] and [place]   Occupation: [employer]    Employer: [employer]    Comment: Part Time  Tobacco Use   Smoking status: Never   Smokeless tobacco: Never  Vaping Use   Vaping Use: Never used  Substance and Sexual Activity   Alcohol use: Not Currently   Drug use: No   Sexual activity: Never  Other Topics Concern   Not on file  Social History Narrative   Not on file   Social Determinants of Health   Financial Resource Strain: Not on file  Food Insecurity: Not on file  Transportation Needs: Not on file  Physical Activity: Not on file  Stress: Not on file  Social Connections: Not on file    Tobacco Counseling Counseling given: Not Answered   Clinical Intake:  Pre-visit preparation completed: Yes  Pain : No/denies pain     Nutritional Risks: None Diabetes: No  How often do you need to have someone help you when you read instructions, pamphlets, or other written materials from your doctor or pharmacy?: 1 - Never  Diabetic?no  Interpreter Needed?: No  Information entered by :: Kirke Shaggy, LPN   Activities of Daily Living In your present state of health, do you have any difficulty performing the following activities: 04/19/2020 04/19/2020  Hearing? N N  Vision? N N  Difficulty concentrating or making decisions? N N  Walking or climbing stairs? Y Y  Dressing or bathing? N N  Doing errands, shopping? N -  Some recent data might be hidden    Patient Care Team: Gwyneth Sprout, FNP as PCP - General (Family Medicine) Ralene Bathe, MD (Dermatology) Abbie Sons, MD (Urology) Eulogio Bear, MD as Consulting Physician (Ophthalmology) Marry Guan, Laurice Record, MD (Orthopedic Surgery)  Indicate any recent Medical Services you may have received from other than [facility] providers in the past year (date may be approximate).     Assessment:   This is a routine wellness examination for [place].  Hearing/Vision screen No  results found.  Dietary issues and exercise activities discussed:     Goals Addressed   None    Depression Screen PHQ 2/9 Scores 04/11/2020 03/17/2020 03/15/2020 12/03/2019 11/19/2019 03/11/2019 03/11/2019  PHQ - 2 Score 0 0 0 0 0 0 0  PHQ- 9 Score 0 - - - 0 - -    Fall Risk Fall Risk  04/11/2020 03/17/2020 03/15/2020 12/03/2019 11/19/2019  Falls in the past year? 0 0 0 0 0  Number falls in past yr: 0 0 0 0 0  Injury with Fall? 0 0 0 0 0  Risk for fall due to : No Fall Risks Orthopedic patient - - -  Follow up Falls evaluation completed Falls evaluation completed - Falls evaluation completed Falls evaluation completed    [place]:  Any stairs in or around the home? Yes  If so, are there any without handrails? No  Home free of loose throw rugs in walkways, pet beds, electrical cords, etc? Yes  Adequate lighting in your home to reduce risk of falls? Yes   ASSISTIVE DEVICES UTILIZED TO PREVENT FALLS:  Life alert? No  Use of a cane, walker or w/c? Yes  Grab bars in the bathroom? Yes  Shower chair or bench in shower? No  Elevated toilet seat or a handicapped toilet? Yes   TIMED UP AND GO:  Was the test performed? Yes .  Length of time to ambulate 10 feet: 4 sec.   Gait steady and fast with assistive device  Cognitive Function:     6CIT Screen 02/24/2018  What Year? 0 points  What month? 0 points  What time? 0 points  Count back from 20 0 points  Months in reverse 0 points  Repeat phrase 0 points  Total Score 0    Immunizations Immunization History  Administered Date(s) Administered   PFIZER(Purple Top)SARS-COV-2 Vaccination 11/18/2019, 12/09/2019   Pneumococcal Conjugate-13 08/28/2016   Pneumococcal Polysaccharide-23 09/12/2017   Td 05/10/1995    TDAP status: Due, Education has been provided regarding the importance of this vaccine. Advised may receive this vaccine at local pharmacy or Health Dept. Aware to provide a copy of the  vaccination record if obtained from local pharmacy or Health Dept. Verbalized acceptance and understanding.  Flu Vaccine status: Declined, Education has been provided regarding the importance of this vaccine but patient still declined. Advised may receive this vaccine at local pharmacy or Health Dept. Aware to provide a copy of the vaccination record if obtained from local pharmacy or Health Dept. Verbalized acceptance and understanding.  Pneumococcal vaccine status: Up to date  Covid-19 vaccine status: Completed vaccines  Qualifies for Shingles Vaccine? Yes   Zostavax completed No   Shingrix Completed?: No.    Education has been provided regarding the importance of this vaccine. Patient has been advised to call insurance company to determine out of pocket expense if they have not yet received this vaccine. Advised may also receive vaccine at local pharmacy or Health Dept. Verbalized acceptance and understanding.  Screening Tests Health Maintenance  Topic Date Due   Zoster Vaccines- Shingrix (1 of 2) Never done   TETANUS/TDAP  05/09/2005   COVID-19 Vaccine (3 - Pfizer risk series) 01/06/2020   DEXA SCAN  02/08/2020   INFLUENZA VACCINE  Never done   Pneumonia Vaccine 70+ Years old  Completed   HPV VACCINES  Aged Out    Health Maintenance  Health Maintenance Due  Topic Date Due   Zoster Vaccines- Shingrix (1 of 2) Never done   TETANUS/TDAP  05/09/2005   COVID-19 Vaccine (3 - Pfizer risk series) 01/06/2020   DEXA SCAN  02/08/2020   INFLUENZA VACCINE  Never done    Colorectal cancer screening: No longer required.   Mammogram status: Completed 11/19/17. Repeat every year AGED OUT  Bone Density status: Completed 02/08/15. Results reflect: Bone density results: NORMAL. Repeat every 5 years.- DECLINED REFERRAL  Lung Cancer Screening: (Low Dose CT Chest recommended if Age 23-80 years, 30 pack-year currently smoking OR have quit w/in 15years.) does not qualify.    Additional  Screening:  Hepatitis C Screening: does not qualify; Completed no  Vision Screening: Recommended annual ophthalmology exams for early detection of glaucoma and other disorders of the eye. Is the patient up to date with their annual eye exam?  Yes  Who is the provider or what is  the name of the office in which the patient attends annual eye exams? [facility] If pt is not established with a provider, would they like to be referred to a provider to establish care? No .   Dental Screening: Recommended annual dental exams for proper oral hygiene  Community Resource Referral / Chronic Care Management: CRR required this visit?  No   CCM required this visit?  No      Plan:     I have personally reviewed and noted the following in the patients chart:   Medical and social history Use of alcohol, tobacco or illicit drugs  Current medications and supplements including opioid prescriptions.  Functional ability and status Nutritional status Physical activity Advanced directives List of other physicians Hospitalizations, surgeries, and ER visits in previous 12 months Vitals Screenings to include cognitive, depression, and falls Referrals and appointments  In addition, I have reviewed and discussed with patient certain preventive protocols, quality metrics, and best practice recommendations. A written personalized care plan for preventive services as well as general preventive health recommendations were provided to patient.     Dionisio David, LPN   0/13/1438   Nurse Notes: none

## 2021-04-11 NOTE — Assessment & Plan Note (Signed)
History of elevated blood glucose level, Check hemoglobin A1c

## 2021-04-11 NOTE — Assessment & Plan Note (Signed)
History of low vitamin D levels not on supplementation recommend repeat draw today

## 2021-04-11 NOTE — Patient Instructions (Signed)
   The CDC recommends two doses of Shingrix (the shingles vaccine) separated by 2 to 6 months for adults age 84 years and older. I recommend checking with your insurance plan regarding coverage for this vaccine.   

## 2021-04-11 NOTE — Assessment & Plan Note (Signed)
History of iron deficiency anemia Recommend CBC as well as iron and ferritin level Encourage iron rich diet Patient denies any active bruising or poor healing

## 2021-04-11 NOTE — Assessment & Plan Note (Signed)
Due for screening for breast cancer; no active complaints at this time

## 2021-04-11 NOTE — Patient Instructions (Signed)
Gina Middleton , Thank you for taking time to come for your Medicare Wellness Visit. I appreciate your ongoing commitment to your health goals. Please review the following plan we discussed and let me know if I can assist you in the future.   Screening recommendations/referrals: Colonoscopy: aged out Mammogram: aged out Bone Density: declined referral Recommended yearly ophthalmology/optometry visit for glaucoma screening and checkup Recommended yearly dental visit for hygiene and checkup  Vaccinations: Influenza vaccine: n/d Pneumococcal vaccine: 09/12/17 Tdap vaccine: 05/10/1995, declined Shingles vaccine: n/d   Covid-19:11/18/19, 12/09/19  Advanced directives: yes  Conditions/risks identified: none  Next appointment: Follow up in one year for your annual wellness visit 04/15/22 @ 1pm in person   Preventive Care 84 Years and Older, Female Preventive care refers to lifestyle choices and visits with your health care provider that can promote health and wellness. What does preventive care include? A yearly physical exam. This is also called an annual well check. Dental exams once or twice a year. Routine eye exams. Ask your health care provider how often you should have your eyes checked. Personal lifestyle choices, including: Daily care of your teeth and gums. Regular physical activity. Eating a healthy diet. Avoiding tobacco and drug use. Limiting alcohol use. Practicing safe sex. Taking low-dose aspirin every day. Taking vitamin and mineral supplements as recommended by your health care provider. What happens during an annual well check? The services and screenings done by your health care provider during your annual well check will depend on your age, overall health, lifestyle risk factors, and family history of disease. Counseling  Your health care provider may ask you questions about your: Alcohol use. Tobacco use. Drug use. Emotional well-being. Home and relationship  well-being. Sexual activity. Eating habits. History of falls. Memory and ability to understand (cognition). Work and work Statistician. Reproductive health. Screening  You may have the following tests or measurements: Height, weight, and BMI. Blood pressure. Lipid and cholesterol levels. These may be checked every 5 years, or more frequently if you are over 50 years old. Skin check. Lung cancer screening. You may have this screening every year starting at age 56 if you have a 30-pack-year history of smoking and currently smoke or have quit within the past 15 years. Fecal occult blood test (FOBT) of the stool. You may have this test every year starting at age 60. Flexible sigmoidoscopy or colonoscopy. You may have a sigmoidoscopy every 5 years or a colonoscopy every 10 years starting at age 32. Hepatitis C blood test. Hepatitis B blood test. Sexually transmitted disease (STD) testing. Diabetes screening. This is done by checking your blood sugar (glucose) after you have not eaten for a while (fasting). You may have this done every 1-3 years. Bone density scan. This is done to screen for osteoporosis. You may have this done starting at age 41. Mammogram. This may be done every 1-2 years. Talk to your health care provider about how often you should have regular mammograms. Talk with your health care provider about your test results, treatment options, and if necessary, the need for more tests. Vaccines  Your health care provider may recommend certain vaccines, such as: Influenza vaccine. This is recommended every year. Tetanus, diphtheria, and acellular pertussis (Tdap, Td) vaccine. You may need a Td booster every 10 years. Zoster vaccine. You may need this after age 34. Pneumococcal 13-valent conjugate (PCV13) vaccine. One dose is recommended after age 16. Pneumococcal polysaccharide (PPSV23) vaccine. One dose is recommended after age 25. Talk to your  health care provider about which  screenings and vaccines you need and how often you need them. This information is not intended to replace advice given to you by your health care provider. Make sure you discuss any questions you have with your health care provider. Document Released: 03/03/2015 Document Revised: 10/25/2015 Document Reviewed: 12/06/2014 Elsevier Interactive Patient Education  2017 [place] Prevention in the Home Falls can cause injuries. They can happen to people of all ages. There are many things you can do to make your home safe and to help prevent falls. What can I do on the outside of my home? Regularly fix the edges of walkways and driveways and fix any cracks. Remove anything that might make you trip as you walk through a door, such as a raised step or threshold. Trim any bushes or trees on the path to your home. Use bright outdoor lighting. Clear any walking paths of anything that might make someone trip, such as rocks or tools. Regularly check to see if handrails are loose or broken. Make sure that both sides of any steps have handrails. Any raised decks and porches should have guardrails on the edges. Have any leaves, snow, or ice cleared regularly. Use sand or salt on walking paths during winter. Clean up any spills in your garage right away. This includes oil or grease spills. What can I do in the bathroom? Use night lights. Install grab bars by the toilet and in the tub and shower. Do not use towel bars as grab bars. Use non-skid mats or decals in the tub or shower. If you need to sit down in the shower, use a plastic, non-slip stool. Keep the floor dry. Clean up any water that spills on the floor as soon as it happens. Remove soap buildup in the tub or shower regularly. Attach bath mats securely with double-sided non-slip rug tape. Do not have throw rugs and other things on the floor that can make you trip. What can I do in the bedroom? Use night lights. Make sure that you have a  light by your bed that is easy to reach. Do not use any sheets or blankets that are too big for your bed. They should not hang down onto the floor. Have a firm chair that has side arms. You can use this for support while you get dressed. Do not have throw rugs and other things on the floor that can make you trip. What can I do in the kitchen? Clean up any spills right away. Avoid walking on wet floors. Keep items that you use a lot in easy-to-reach places. If you need to reach something above you, use a strong step stool that has a grab bar. Keep electrical cords out of the way. Do not use floor polish or wax that makes floors slippery. If you must use wax, use non-skid floor wax. Do not have throw rugs and other things on the floor that can make you trip. What can I do with my stairs? Do not leave any items on the stairs. Make sure that there are handrails on both sides of the stairs and use them. Fix handrails that are broken or loose. Make sure that handrails are as long as the stairways. Check any carpeting to make sure that it is firmly attached to the stairs. Fix any carpet that is loose or worn. Avoid having throw rugs at the top or bottom of the stairs. If you do have throw rugs, attach them  to the floor with carpet tape. Make sure that you have a light switch at the top of the stairs and the bottom of the stairs. If you do not have them, ask someone to add them for you. What else can I do to help prevent falls? Wear shoes that: Do not have high heels. Have rubber bottoms. Are comfortable and fit you well. Are closed at the toe. Do not wear sandals. If you use a stepladder: Make sure that it is fully opened. Do not climb a closed stepladder. Make sure that both sides of the stepladder are locked into place. Ask someone to hold it for you, if possible. Clearly mark and make sure that you can see: Any grab bars or handrails. First and last steps. Where the edge of each step  is. Use tools that help you move around (mobility aids) if they are needed. These include: Canes. Walkers. Scooters. Crutches. Turn on the lights when you go into a dark area. Replace any light bulbs as soon as they burn out. Set up your furniture so you have a clear path. Avoid moving your furniture around. If any of your floors are uneven, fix them. If there are any pets around you, be aware of where they are. Review your medicines with your doctor. Some medicines can make you feel dizzy. This can increase your chance of falling. Ask your doctor what other things that you can do to help prevent falls. This information is not intended to replace advice given to you by your health care provider. Make sure you discuss any questions you have with your health care provider. Document Released: 12/01/2008 Document Revised: 07/13/2015 Document Reviewed: 03/11/2014 Elsevier Interactive Patient Education  2017 [employer].

## 2021-04-11 NOTE — Assessment & Plan Note (Signed)
Patient up-to-date on dental and vision checks reports history of left retinal leak followed by Dr. Edison Pace denies dental concerns followed by Dr. Juleen China Things to do to keep yourself healthy  - Exercise at least 30-45 minutes a day, 3-4 days a week.  - Eat a low-fat diet with lots of fruits and vegetables, up to 7-9 servings per day.  - Seatbelts can save your life. Wear them always.  - Smoke detectors on every level of your home, check batteries every year.  - Eye Doctor - have an eye exam every 1-2 years  - Safe sex - if you may be exposed to STDs, use a condom.  - Alcohol -  If you drink, do it moderately, less than 2 drinks per day.  - [place]. Choose someone to speak for you if you are not able.  - Depression is common in our stressful world.If you're feeling down or losing interest in things you normally enjoy, please come in for a visit.  - Violence - If anyone is threatening or hurting you, please call immediately.

## 2021-04-12 ENCOUNTER — Other Ambulatory Visit: Payer: Self-pay | Admitting: Family Medicine

## 2021-04-12 LAB — COMPREHENSIVE METABOLIC PANEL
ALT: 13 IU/L (ref 0–32)
AST: 21 IU/L (ref 0–40)
Albumin/Globulin Ratio: 1.7 (ref 1.2–2.2)
Albumin: 4.4 g/dL (ref 3.6–4.6)
Alkaline Phosphatase: 103 IU/L (ref 44–121)
BUN/Creatinine Ratio: 16 (ref 12–28)
BUN: 19 mg/dL (ref 8–27)
Bilirubin Total: 0.5 mg/dL (ref 0.0–1.2)
CO2: 21 mmol/L (ref 20–29)
Calcium: 9.6 mg/dL (ref 8.7–10.3)
Chloride: 101 mmol/L (ref 96–106)
Creatinine, Ser: 1.22 mg/dL — ABNORMAL HIGH (ref 0.57–1.00)
Globulin, Total: 2.6 g/dL (ref 1.5–4.5)
Glucose: 98 mg/dL (ref 70–99)
Potassium: 4.2 mmol/L (ref 3.5–5.2)
Sodium: 140 mmol/L (ref 134–144)
Total Protein: 7 g/dL (ref 6.0–8.5)
eGFR: 44 mL/min/{1.73_m2} — ABNORMAL LOW (ref >59)

## 2021-04-12 LAB — CBC WITH DIFFERENTIAL/PLATELET
Basophils Absolute: 0 10*3/uL (ref 0.0–0.2)
Basos: 1 %
EOS (ABSOLUTE): 0.2 10*3/uL (ref 0.0–0.4)
Eos: 3 %
Hematocrit: 34.6 % (ref 34.0–46.6)
Hemoglobin: 11.5 g/dL (ref 11.1–15.9)
Immature Grans (Abs): 0 10*3/uL (ref 0.0–0.1)
Immature Granulocytes: 1 %
Lymphocytes Absolute: 1.6 10*3/uL (ref 0.7–3.1)
Lymphs: 23 %
MCH: 30.3 pg (ref 26.6–33.0)
MCHC: 33.2 g/dL (ref 31.5–35.7)
MCV: 91 fL (ref 79–97)
Monocytes Absolute: 0.5 10*3/uL (ref 0.1–0.9)
Monocytes: 8 %
Neutrophils Absolute: 4.5 10*3/uL (ref 1.4–7.0)
Neutrophils: 64 %
Platelets: 225 10*3/uL (ref 150–450)
RBC: 3.8 x10E6/uL (ref 3.77–5.28)
RDW: 13.2 % (ref 11.7–15.4)
WBC: 6.8 10*3/uL (ref 3.4–10.8)

## 2021-04-12 LAB — VITAMIN B12: Vitamin B-12: 537 pg/mL (ref 232–1245)

## 2021-04-12 LAB — IRON,TIBC AND FERRITIN PANEL
Ferritin: 104 ng/mL (ref 15–150)
Iron Saturation: 25 % (ref 15–55)
Iron: 73 ug/dL (ref 27–139)
Total Iron Binding Capacity: 289 ug/dL (ref 250–450)
UIBC: 216 ug/dL (ref 118–369)

## 2021-04-12 LAB — HEMOGLOBIN A1C
Est. average glucose Bld gHb Est-mCnc: 120 mg/dL
Hgb A1c MFr Bld: 5.8 % — ABNORMAL HIGH (ref 4.8–5.6)

## 2021-04-12 LAB — VITAMIN D 25 HYDROXY (VIT D DEFICIENCY, FRACTURES): Vit D, 25-Hydroxy: 22 ng/mL — ABNORMAL LOW (ref 30.0–100.0)

## 2021-04-12 LAB — LIPID PANEL WITH LDL/HDL RATIO
Cholesterol, Total: 180 mg/dL (ref 100–199)
HDL: 31 mg/dL — ABNORMAL LOW (ref >39)
LDL Chol Calc (NIH): 89 mg/dL (ref 0–99)
LDL/HDL Ratio: 2.9 ratio (ref 0.0–3.2)
Triglycerides: 364 mg/dL — ABNORMAL HIGH (ref 0–149)
VLDL Cholesterol Cal: 60 mg/dL — ABNORMAL HIGH (ref 5–40)

## 2021-04-12 MED ORDER — VITAMIN D (ERGOCALCIFEROL) 1.25 MG (50000 UNIT) PO CAPS: 50000.0000 [IU] | ORAL_CAPSULE | ORAL | 1 refills | Status: DC

## 2021-05-08 NOTE — Progress Notes (Signed)
? ?I,Elena D DeSanto,acting as a scribe for Gwyneth Sprout, FNP.,have documented all relevant documentation on the behalf of Gwyneth Sprout, FNP,as directed by  Gwyneth Sprout, FNP while in the presence of Gwyneth Sprout, FNP. ?  ? ? ?Established patient visit ? ? ?Patient: Gina Middleton   DOB: 17-Apr-1935   86 y.o. Female  MRN: [id number] ?Visit Date: 05/09/2021 ? ?Today's healthcare provider: Gwyneth Sprout, FNP  ?Re Introduced to nurse practitioner role and practice setting.  All questions answered.  Discussed provider/patient relationship and expectations. ? ? ?No chief complaint on file. ? ?Subjective  ?  ?HPI  ?Hypertension, follow-up ? ?BP Readings from Last 3 Encounters:  ?05/09/21 138/76  ?04/11/21 (!) 150/80  ?04/11/21 (!) 150/80  ? Wt Readings from Last 3 Encounters:  ?05/09/21 138 lb (62.6 kg)  ?04/11/21 136 lb 12.8 oz (62.1 kg)  ?10/18/20 120 lb (54.4 kg)  ?  ? ?She was last seen for hypertension 4 weeks ago.  ?BP at that visit was as above. Management since that visit includes adding Amlodipine to current regiment.  She has also been asked to monitor pressure at home. ? ?She reports good compliance with treatment. ?She is not having side effects.  ? ? ?Use of agents associated with hypertension: none.  ? ?Outside blood pressures are not being checked; still has not purchased a BP machine ?Symptoms: ?No chest pain No chest pressure  ?No palpitations No syncope  ?No dyspnea No orthopnea  ?No paroxysmal nocturnal dyspnea No lower extremity edema  ? ?Pertinent labs: ?Lab Results  ?Component Value Date  ? CHOL 180 04/11/2021  ? HDL 31 (L) 04/11/2021  ? [place] 89 04/11/2021  ? TRIG 364 (H) 04/11/2021  ? CHOLHDL 3.4 03/15/2019  ? Lab Results  ?Component Value Date  ? NA 140 04/11/2021  ? K 4.2 04/11/2021  ? CREATININE 1.22 (H) 04/11/2021  ? EGFR 44 (L) 04/11/2021  ? GLUCOSE 98 04/11/2021  ? TSH 1.720 02/27/2018  ?  ? ?The ASCVD Risk score (Arnett DK, et al., 2019) failed to calculate for the following  reasons: ?  The 2019 ASCVD risk score is only valid for ages 14 to 8  ? ?--------------------------------------------------------------------------------------------------- ? ? ?Medications: ?Outpatient Medications Prior to Visit  ?Medication Sig  ? acetaminophen (TYLENOL) 325 MG tablet Take 325 mg by mouth every 6 (six) hours as needed.  ? aspirin EC 81 MG tablet Take 81 mg by mouth daily. Swallow whole.  ? losartan-hydrochlorothiazide (HYZAAR) 50-12.5 MG tablet Take 1 tablet by mouth daily.  ? Vitamin D, Ergocalciferol, (DRISDOL) 1.25 MG (50000 UNIT) CAPS capsule Take 1 capsule (50,000 Units total) by mouth every 7 (seven) days.  ? [DISCONTINUED] amLODipine (NORVASC) 5 MG tablet Take 1 tablet (5 mg total) by mouth daily.  ? ?No facility-administered medications prior to visit.  ? ? ?Review of Systems ? ?Last CBC ?Lab Results  ?Component Value Date  ? WBC 6.8 04/11/2021  ? HGB 11.5 04/11/2021  ? HCT 34.6 04/11/2021  ? MCV 91 04/11/2021  ? MCH 30.3 04/11/2021  ? RDW 13.2 04/11/2021  ? PLT 225 04/11/2021  ? ?Last metabolic panel ?Lab Results  ?Component Value Date  ? GLUCOSE 98 04/11/2021  ? NA 140 04/11/2021  ? K 4.2 04/11/2021  ? CL 101 04/11/2021  ? CO2 21 04/11/2021  ? BUN 19 04/11/2021  ? CREATININE 1.22 (H) 04/11/2021  ? EGFR 44 (L) 04/11/2021  ? CALCIUM 9.6 04/11/2021  ? PROT 7.0 04/11/2021  ?  ALBUMIN 4.4 04/11/2021  ? LABGLOB 2.6 04/11/2021  ? AGRATIO 1.7 04/11/2021  ? BILITOT 0.5 04/11/2021  ? ALKPHOS 103 04/11/2021  ? AST 21 04/11/2021  ? ALT 13 04/11/2021  ? ANIONGAP 7 04/10/2020  ? ?Last lipids ?Lab Results  ?Component Value Date  ? CHOL 180 04/11/2021  ? HDL 31 (L) 04/11/2021  ? [place] 89 04/11/2021  ? TRIG 364 (H) 04/11/2021  ? CHOLHDL 3.4 03/15/2019  ? ?Last hemoglobin A1c ?Lab Results  ?Component Value Date  ? HGBA1C 5.8 (H) 04/11/2021  ? ?  ?  Objective  ?  ?BP 138/76 Comment: manual  Pulse 79   Temp 98.9 ?F (37.2 ?C) (Oral)   Wt 138 lb (62.6 kg)   SpO2 97%   BMI 24.45 kg/m?  ?BP Readings from  Last 3 Encounters:  ?05/09/21 138/76  ?04/11/21 (!) 150/80  ?04/11/21 (!) 150/80  ? ?Wt Readings from Last 3 Encounters:  ?05/09/21 138 lb (62.6 kg)  ?04/11/21 136 lb 12.8 oz (62.1 kg)  ?10/18/20 120 lb (54.4 kg)  ? ?SpO2 Readings from Last 3 Encounters:  ?05/09/21 97%  ?04/11/21 97%  ?04/11/21 97%  ? ?  ? ?Physical Exam ?Vitals and nursing note reviewed.  ?Constitutional:   ?   General: She is not in acute distress. ?   Appearance: Normal appearance. She is normal weight. She is not ill-appearing, toxic-appearing or diaphoretic.  ?HENT:  ?   Head: Normocephalic and atraumatic.  ?Cardiovascular:  ?   Rate and Rhythm: Normal rate and regular rhythm.  ?   Pulses: Normal pulses.  ?   Heart sounds: Normal heart sounds. No murmur heard. ?  No friction rub. No gallop.  ?Pulmonary:  ?   Effort: Pulmonary effort is normal. No respiratory distress.  ?   Breath sounds: Normal breath sounds. No stridor. No wheezing, rhonchi or rales.  ?Chest:  ?   Chest wall: No tenderness.  ?Abdominal:  ?   General: Bowel sounds are normal.  ?   Palpations: Abdomen is soft.  ?Musculoskeletal:     ?   General: No swelling, tenderness, deformity or signs of injury. Normal range of motion.  ?   Right lower leg: No edema.  ?   Left lower leg: No edema.  ?Skin: ?   General: Skin is warm and dry.  ?   Capillary Refill: Capillary refill takes less than 2 seconds.  ?   Coloration: Skin is not jaundiced or pale.  ?   Findings: No bruising, erythema, lesion or rash.  ?Neurological:  ?   General: No focal deficit present.  ?   Mental Status: She is alert and oriented to person, place, and time. Mental status is at baseline.  ?   Cranial Nerves: No cranial nerve deficit.  ?   Sensory: No sensory deficit.  ?   Motor: No weakness.  ?   Coordination: Coordination normal.  ?Psychiatric:     ?   Mood and Affect: Mood normal.     ?   Behavior: Behavior normal.     ?   Thought Content: Thought content normal.     ?   Judgment: Judgment normal.  ?  ? ?No results  found for any visits on 05/09/21. ? Assessment & Plan  ? ?Problem List Items Addressed This Visit   ? ?  ? Cardiovascular and Mediastinum  ? Hypertension  ?  Chronic, stable on manual check ?Denies CP ?Denies SOB/ DOE ?Denies low blood pressure/hypotension ?Denies  vision changes ?No LE Edema noted on exam ?Continue medication, norvasc 5 mg QD with hyzaar 50/12.5 QD ?Denies side effects ?Refills provided ?Seek emergent care if you develop chest pain or chest pressure ? ?  ?  ? Relevant Medications  ? amLODipine (NORVASC) 5 MG tablet  ? ? ? ?Return in about 6 months (around 11/09/2021).  ?   ? ?I, Gwyneth Sprout, FNP, have reviewed all documentation for this visit. The documentation on 05/09/21 for the exam, diagnosis, procedures, and orders are all accurate and complete. ? ? ? ?Gwyneth Sprout, FNP  ?[place] ?[phone] (phone) ?[phone] (fax) ? ?[facility] ?

## 2021-05-09 ENCOUNTER — Ambulatory Visit: Payer: Medicare PPO | Admitting: Family Medicine

## 2021-05-09 ENCOUNTER — Other Ambulatory Visit: Payer: Self-pay

## 2021-05-09 ENCOUNTER — Encounter: Payer: Self-pay | Admitting: Family Medicine

## 2021-05-09 DIAGNOSIS — I10 Essential (primary) hypertension: Secondary | ICD-10-CM

## 2021-05-09 MED ORDER — AMLODIPINE BESYLATE 5 MG PO TABS: 5.0000 mg | ORAL_TABLET | Freq: Every day | ORAL | 1 refills | Status: DC

## 2021-05-09 NOTE — Assessment & Plan Note (Signed)
Chronic, stable on manual check ?Denies CP ?Denies SOB/ DOE ?Denies low blood pressure/hypotension ?Denies vision changes ?No LE Edema noted on exam ?Continue medication, norvasc 5 mg QD with hyzaar 50/12.5 QD ?Denies side effects ?Refills provided ?Seek emergent care if you develop chest pain or chest pressure ? ?

## 2021-05-23 DIAGNOSIS — H34812 Central retinal vein occlusion, left eye, with macular edema: Secondary | ICD-10-CM | POA: Diagnosis not present

## 2021-06-06 ENCOUNTER — Ambulatory Visit
Admission: RE | Admit: 2021-06-06 | Discharge: 2021-06-06 | Disposition: A | Discharge status: Home / Self Care | Payer: Medicare PPO | Source: Ambulatory Visit | Attending: Family Medicine | Admitting: Family Medicine

## 2021-06-06 DIAGNOSIS — Z13820 Encounter for screening for osteoporosis: Secondary | ICD-10-CM

## 2021-06-06 DIAGNOSIS — M85832 Other specified disorders of bone density and structure, left forearm: Secondary | ICD-10-CM | POA: Insufficient documentation

## 2021-06-06 DIAGNOSIS — Z780 Asymptomatic menopausal state: Secondary | ICD-10-CM | POA: Diagnosis not present

## 2021-06-06 DIAGNOSIS — Z1231 Encounter for screening mammogram for malignant neoplasm of breast: Secondary | ICD-10-CM | POA: Insufficient documentation

## 2021-06-06 NOTE — Progress Notes (Signed)
Please call patient: Dexa indicates Osteopenia present. Continue to recommend use of Vit D and Calcium supplements with walking for exercise. Gwyneth Sprout, FNP  ?[facility] ?[address] ?[place], [place] [address] ?[phone] (phone) ?[phone] (fax) ?[facility] ?

## 2021-08-22 DIAGNOSIS — H34812 Central retinal vein occlusion, left eye, with macular edema: Secondary | ICD-10-CM | POA: Diagnosis not present

## 2021-10-17 ENCOUNTER — Other Ambulatory Visit: Payer: Self-pay | Admitting: *Deleted

## 2021-10-17 DIAGNOSIS — N200 Calculus of kidney: Secondary | ICD-10-CM

## 2021-10-19 ENCOUNTER — Ambulatory Visit: Payer: Medicare PPO | Admitting: Urology

## 2021-10-19 ENCOUNTER — Ambulatory Visit
Admission: RE | Admit: 2021-10-19 | Discharge: 2021-10-19 | Disposition: A | Discharge status: Home / Self Care | Payer: Medicare PPO | Attending: Urology | Admitting: Urology

## 2021-10-19 ENCOUNTER — Encounter: Payer: Self-pay | Admitting: Urology

## 2021-10-19 ENCOUNTER — Ambulatory Visit
Admission: RE | Admit: 2021-10-19 | Discharge: 2021-10-19 | Disposition: A | Discharge status: Home / Self Care | Payer: Medicare PPO | Source: Ambulatory Visit | Attending: Urology | Admitting: Urology

## 2021-10-19 VITALS — BP 128/65 | HR 76 | Ht 63.0 in | Wt 120.0 lb

## 2021-10-19 DIAGNOSIS — N200 Calculus of kidney: Secondary | ICD-10-CM | POA: Insufficient documentation

## 2021-10-19 DIAGNOSIS — Z87442 Personal history of urinary calculi: Secondary | ICD-10-CM

## 2021-10-19 NOTE — Progress Notes (Signed)
10/19/2021 11:40 AM   Gina Middleton 1935-11-30 [id number]  Referring provider: [name], PA-C [address] [place],  [place] [address]  Chief Complaint  Patient presents with   Nephrolithiasis    1 year follow up w/KUB    Urologic history: 1.  Nephrolithiasis -CT 03/2018 w/ 2-2 mm nonobstructing right renal calculi -Status post ureteroscopic removal 3 mm left distal calculus   HPI: 86 y.o. female presents for annual follow-up.  Doing well since last visit No bothersome LUTS Denies dysuria, gross hematuria Denies flank, abdominal or pelvic pain    PMH: Past Medical History:  Diagnosis Date   Actinic keratosis 10/08/2012   Left mid to distal tricep    Arthritis    Basal cell carcinoma 07/21/2018   Left inferior antihelix   Complication of anesthesia    History of kidney stones    Hypertension    Pneumonia 03/2017   PONV (postoperative nausea and vomiting) 1980's   n/v with lap choley and n/v after 08-2019 hip surgery    Surgical History: Past Surgical History:  Procedure Laterality Date   [place] Right 12/22/2019   Procedure: CARPAL TUNNEL RELEASE;  Surgeon: Dereck Leep, MD;  Location: [facility] ORS;  Service: Orthopedics;  Laterality: Right;   CHOLECYSTECTOMY     CYSTOSCOPY W/ RETROGRADES Left 04/09/2018   Procedure: CYSTOSCOPY WITH RETROGRADE PYELOGRAM;  Surgeon: Abbie Sons, MD;  Location: [facility] ORS;  Service: Urology;  Laterality: Left;   CYSTOSCOPY WITH STENT PLACEMENT Left 04/09/2018   Procedure: CYSTOSCOPY WITH STENT PLACEMENT-LEFT;  Surgeon: Abbie Sons, MD;  Location: [facility] OR[facility];  Service: Urology;  Laterality: Left;   CYSTOSCOPY WITH URETEROSCOPY Left 04/09/2018   Procedure: POSSIBLE URETEROSCOPY-LEFT;  Surgeon: Abbie Sons, MD;  Location: [facility] OR[facility];  Service: Urology;  Laterality: Left;   TOTAL HIP ARTHROPLASTY Right 08/25/2019   Procedure: TOTAL HIP  ARTHROPLASTY;  Surgeon: Dereck Leep, MD;  Location: [facility] OR[facility];  Service: Orthopedics;  Laterality: Right;   TOTAL HIP ARTHROPLASTY Left 04/19/2020   Procedure: TOTAL HIP ARTHROPLASTY;  Surgeon: Dereck Leep, MD;  Location: [facility] OR[facility];  Service: Orthopedics;  Laterality: Left;    Home Medications:  Allergies as of 10/19/2021       Reactions   Other Anaphylaxis   A muscle relaxer in the 70s but cannot remember name of the medication that caused it   Hydrocodone-acetaminophen Other (See Comments)   dizziness   Lisinopril    Hair thinning   Oxycodone-acetaminophen Nausea And Vomiting   Iodinated Contrast Media Rash        Medication List        Accurate as of October 19, 2021 11:40 AM. If you have any questions, ask your nurse or doctor.          acetaminophen 325 MG tablet Commonly known as: TYLENOL Take 325 mg by mouth every 6 (six) hours as needed.   amLODipine 5 MG tablet Commonly known as: Norvasc Take 1 tablet (5 mg total) by mouth daily.   aspirin EC 81 MG tablet Take 81 mg by mouth daily. Swallow whole.   losartan-hydrochlorothiazide 50-12.5 MG tablet Commonly known as: HYZAAR Take 1 tablet by mouth daily.   Vitamin D (Ergocalciferol) 1.25 MG (50000 UNIT) Caps capsule Commonly known as: DRISDOL Take 1 capsule (50,000 Units total) by mouth every 7 (seven) days.        Allergies:  Allergies  Allergen Reactions   Other Anaphylaxis    A muscle relaxer in the 70s but cannot remember name of the medication that caused it   Hydrocodone-Acetaminophen Other (See Comments)    dizziness   Lisinopril     Hair thinning   Oxycodone-Acetaminophen Nausea And Vomiting   Iodinated Contrast Media Rash    Family History: Family History  Problem Relation Age of Onset   Hypertension Mother    Alzheimer's disease Mother    Heart attack Father        x's 3   Diabetes Sister    Heart attack Sister    Breast cancer Maternal Grandmother     Social History:   reports that she has never smoked. She has never used smokeless tobacco. She reports that she does not currently use alcohol. She reports that she does not use drugs.   Physical Exam: BP 128/65   Pulse 76   Ht '5\' 3"'$  (1.6 m)   Wt 120 lb (54.4 kg)   BMI 21.26 kg/m   Constitutional:  Alert and oriented, No acute distress. HEENT: [place] AT, moist mucus membranes.  Trachea midline, no masses. Cardiovascular: No clubbing, cyanosis, or edema. Respiratory: Normal respiratory effort, no increased work of breathing. GI: Abdomen is soft, nontender, nondistended, no abdominal masses GU: No CVA tenderness Lymph: No cervical or inguinal lymphadenopathy. Skin: No rashes, bruises or suspicious lesions. Neurologic: Grossly intact, no focal deficits, moving all 4 extremities. Psychiatric: Normal mood and affect.  Pertinent imaging Images of a KUB performed today were personally reviewed and interpreted.  There are degenerative changes of the spine.  Pelvic phleboliths present.  No calcifications are identified overlying the renal outlines or expected course of the ureters  Assessment & Plan:    1.  Nephrolithiasis Negative KUB today Follow-up in 2 years for office visit with KUB Call earlier for any urinary symptoms or flank pain   Abbie Sons, MD  [facility] [address], [place], [place] [address] [phone]

## 2021-11-05 ENCOUNTER — Encounter: Payer: Self-pay | Admitting: Family Medicine

## 2021-11-05 ENCOUNTER — Ambulatory Visit: Payer: Medicare PPO | Admitting: Family Medicine

## 2021-11-05 DIAGNOSIS — I10 Essential (primary) hypertension: Secondary | ICD-10-CM | POA: Diagnosis not present

## 2021-11-05 MED ORDER — AMLODIPINE BESYLATE 5 MG PO TABS: 5.0000 mg | ORAL_TABLET | Freq: Every day | ORAL | 1 refills | Status: DC

## 2021-11-05 NOTE — Assessment & Plan Note (Signed)
Chronic, stable At goal <140/<90 Continue Hyzaar 50-12.5 and Norvasc 5 mg No side effects No LE edema Declines repeat CMP at this time as she's feeling "fine"

## 2021-11-05 NOTE — Progress Notes (Signed)
Established patient visit   Patient: Gina Middleton   DOB: 01-Dec-1935   86 y.o. Female  MRN: [id number] Visit Date: 11/05/2021  Today's healthcare provider: Gwyneth Sprout, FNP  Re Introduced to nurse practitioner role and practice setting.  All questions answered.  Discussed provider/patient relationship and expectations.   I,Tiffany J Bragg,acting as a scribe for Gwyneth Sprout, FNP.,have documented all relevant documentation on the behalf of Gwyneth Sprout, FNP,as directed by  Gwyneth Sprout, FNP while in the presence of Gwyneth Sprout, FNP.   Chief Complaint  Patient presents with   Hypertension   Subjective    HPI  Hypertension, follow-up  BP Readings from Last 3 Encounters:  11/05/21 128/63  10/19/21 128/65  05/09/21 138/76   Wt Readings from Last 3 Encounters:  11/05/21 141 lb (64 kg)  10/19/21 120 lb (54.4 kg)  05/09/21 138 lb (62.6 kg)     She was last seen for hypertension 6 months ago.  BP at that visit was 138/76. Management since that visit includes continue medication, norvasc 5 mg QD with hyzaar 50/12.5 QD.  She reports excellent compliance with treatment. She is not having side effects.  She is following a Regular diet. She is exercising. She does not smoke.  Use of agents associated with hypertension: none.   Outside blood pressures are not checked. Symptoms: No chest pain No chest pressure  No palpitations No syncope  No dyspnea No orthopnea  No paroxysmal nocturnal dyspnea No lower extremity edema   Pertinent labs Lab Results  Component Value Date   CHOL 180 04/11/2021   HDL 31 (L) 04/11/2021   LDLCALC 89 04/11/2021   TRIG 364 (H) 04/11/2021   CHOLHDL 3.4 03/15/2019   Lab Results  Component Value Date   NA 140 04/11/2021   K 4.2 04/11/2021   CREATININE 1.22 (H) 04/11/2021   EGFR 44 (L) 04/11/2021   GLUCOSE 98 04/11/2021   TSH 1.720 02/27/2018     The ASCVD Risk score (Arnett DK, et al., 2019) failed to calculate for the  following reasons:   The 2019 ASCVD risk score is only valid for ages 17 to 64  ---------------------------------------------------------------------------------------------------   Medications: Outpatient Medications Prior to Visit  Medication Sig   acetaminophen (TYLENOL) 325 MG tablet Take 325 mg by mouth every 6 (six) hours as needed.   aspirin EC 81 MG tablet Take 81 mg by mouth daily. Swallow whole.   losartan-hydrochlorothiazide (HYZAAR) 50-12.5 MG tablet Take 1 tablet by mouth daily.   Vitamin D, Ergocalciferol, (DRISDOL) 1.25 MG (50000 UNIT) CAPS capsule Take 1 capsule (50,000 Units total) by mouth every 7 (seven) days.   [DISCONTINUED] amLODipine (NORVASC) 5 MG tablet Take 1 tablet (5 mg total) by mouth daily.   No facility-administered medications prior to visit.    Review of Systems  Last CBC Lab Results  Component Value Date   WBC 6.8 04/11/2021   HGB 11.5 04/11/2021   HCT 34.6 04/11/2021   MCV 91 04/11/2021   MCH 30.3 04/11/2021   RDW 13.2 04/11/2021   PLT 225 21/97/5883   Last metabolic panel Lab Results  Component Value Date   GLUCOSE 98 04/11/2021   NA 140 04/11/2021   K 4.2 04/11/2021   CL 101 04/11/2021   CO2 21 04/11/2021   BUN 19 04/11/2021   CREATININE 1.22 (H) 04/11/2021   EGFR 44 (L) 04/11/2021   CALCIUM 9.6 04/11/2021   PROT 7.0 04/11/2021   ALBUMIN 4.4  04/11/2021   LABGLOB 2.6 04/11/2021   AGRATIO 1.7 04/11/2021   BILITOT 0.5 04/11/2021   ALKPHOS 103 04/11/2021   AST 21 04/11/2021   ALT 13 04/11/2021   ANIONGAP 7 04/10/2020   Last lipids Lab Results  Component Value Date   CHOL 180 04/11/2021   HDL 31 (L) 04/11/2021   LDLCALC 89 04/11/2021   TRIG 364 (H) 04/11/2021   CHOLHDL 3.4 03/15/2019   Last hemoglobin A1c Lab Results  Component Value Date   HGBA1C 5.8 (H) 04/11/2021   Last thyroid functions Lab Results  Component Value Date   TSH 1.720 02/27/2018   Last vitamin D Lab Results  Component Value Date   VD25OH 22.0  (L) 04/11/2021   Last vitamin B12 and Folate Lab Results  Component Value Date   VITAMINB12 537 04/11/2021       Objective    BP 128/63 (BP Location: Right Arm, Patient Position: Sitting, Cuff Size: Normal)   Pulse 71   Temp 98.2 F (36.8 C) (Oral)   Resp 16   Ht 5' 3" (1.6 m)   Wt 141 lb (64 kg)   SpO2 99%   BMI 24.98 kg/m   BP Readings from Last 3 Encounters:  11/05/21 128/63  10/19/21 128/65  05/09/21 138/76   Wt Readings from Last 3 Encounters:  11/05/21 141 lb (64 kg)  10/19/21 120 lb (54.4 kg)  05/09/21 138 lb (62.6 kg)   SpO2 Readings from Last 3 Encounters:  11/05/21 99%  05/09/21 97%  04/11/21 97%   Physical Exam Vitals and nursing note reviewed.  Constitutional:      General: She is not in acute distress.    Appearance: Normal appearance. She is normal weight. She is not ill-appearing, toxic-appearing or diaphoretic.  HENT:     Head: Normocephalic and atraumatic.  Cardiovascular:     Rate and Rhythm: Normal rate and regular rhythm.     Pulses: Normal pulses.     Heart sounds: Murmur heard.     No friction rub. No gallop.  Pulmonary:     Effort: Pulmonary effort is normal. No respiratory distress.     Breath sounds: Normal breath sounds. No stridor. No wheezing, rhonchi or rales.  Chest:     Chest wall: No tenderness.  Abdominal:     General: Bowel sounds are normal.     Palpations: Abdomen is soft.  Musculoskeletal:        General: No swelling, tenderness, deformity or signs of injury. Normal range of motion.     Right lower leg: No edema.     Left lower leg: No edema.  Skin:    General: Skin is warm and dry.     Capillary Refill: Capillary refill takes less than 2 seconds.     Coloration: Skin is not jaundiced or pale.     Findings: No bruising, erythema, lesion or rash.  Neurological:     General: No focal deficit present.     Mental Status: She is alert and oriented to person, place, and time. Mental status is at baseline.     Cranial  Nerves: No cranial nerve deficit.     Sensory: No sensory deficit.     Motor: No weakness.     Coordination: Coordination normal.  Psychiatric:        Mood and Affect: Mood normal.        Behavior: Behavior normal.        Thought Content: Thought content normal.  Judgment: Judgment normal.     No results found for any visits on 11/05/21.  Assessment & Plan     Problem List Items Addressed This Visit       Cardiovascular and Mediastinum   Hypertension    Chronic, stable At goal <140/<90 Continue Hyzaar 50-12.5 and Norvasc 5 mg No side effects No LE edema Declines repeat CMP at this time as she's feeling "fine"      Relevant Medications   amLODipine (NORVASC) 5 MG tablet   Return in about 5 months (around 04/07/2022) for annual examination.      Vonna Kotyk, FNP, have reviewed all documentation for this visit. The documentation on 11/05/21 for the exam, diagnosis, procedures, and orders are all accurate and complete.  Gwyneth Sprout, [place] [phone] (phone) [phone] (fax)  [place]

## 2021-11-28 DIAGNOSIS — H34812 Central retinal vein occlusion, left eye, with macular edema: Secondary | ICD-10-CM | POA: Diagnosis not present

## 2022-03-20 DIAGNOSIS — H34812 Central retinal vein occlusion, left eye, with macular edema: Secondary | ICD-10-CM | POA: Diagnosis not present

## 2022-04-16 ENCOUNTER — Encounter: Payer: Medicare PPO | Admitting: Family Medicine

## 2022-05-09 ENCOUNTER — Emergency Department: Payer: Medicare PPO

## 2022-05-09 ENCOUNTER — Emergency Department
Admission: EM | Admit: 2022-05-09 | Discharge: 2022-05-09 | Disposition: A | Discharge status: Home / Self Care | Payer: Medicare PPO | Attending: Emergency Medicine | Admitting: Emergency Medicine

## 2022-05-09 ENCOUNTER — Other Ambulatory Visit: Payer: Self-pay

## 2022-05-09 DIAGNOSIS — R55 Syncope and collapse: Secondary | ICD-10-CM | POA: Diagnosis not present

## 2022-05-09 DIAGNOSIS — R112 Nausea with vomiting, unspecified: Secondary | ICD-10-CM | POA: Diagnosis not present

## 2022-05-09 DIAGNOSIS — R42 Dizziness and giddiness: Secondary | ICD-10-CM | POA: Diagnosis not present

## 2022-05-09 DIAGNOSIS — I10 Essential (primary) hypertension: Secondary | ICD-10-CM | POA: Insufficient documentation

## 2022-05-09 DIAGNOSIS — R519 Headache, unspecified: Secondary | ICD-10-CM | POA: Diagnosis not present

## 2022-05-09 LAB — CBC WITH DIFFERENTIAL/PLATELET
Abs Immature Granulocytes: 0.05 10*3/uL (ref 0.00–0.07)
Basophils Absolute: 0.1 10*3/uL (ref 0.0–0.1)
Basophils Relative: 1 %
Eosinophils Absolute: 0.1 10*3/uL (ref 0.0–0.5)
Eosinophils Relative: 2 %
HCT: 36.1 % (ref 36.0–46.0)
Hemoglobin: 11.9 g/dL — ABNORMAL LOW (ref 12.0–15.0)
Immature Granulocytes: 1 %
Lymphocytes Relative: 26 %
Lymphs Abs: 1.6 10*3/uL (ref 0.7–4.0)
MCH: 29.8 pg (ref 26.0–34.0)
MCHC: 33 g/dL (ref 30.0–36.0)
MCV: 90.3 fL (ref 80.0–100.0)
Monocytes Absolute: 0.5 10*3/uL (ref 0.1–1.0)
Monocytes Relative: 8 %
Neutro Abs: 3.9 10*3/uL (ref 1.7–7.7)
Neutrophils Relative %: 62 %
Platelets: 197 10*3/uL (ref 150–400)
RBC: 4 MIL/uL (ref 3.87–5.11)
RDW: 12.9 % (ref 11.5–15.5)
WBC: 6.2 10*3/uL (ref 4.0–10.5)
nRBC: 0 % (ref 0.0–0.2)

## 2022-05-09 LAB — COMPREHENSIVE METABOLIC PANEL
ALT: 14 U/L (ref 0–44)
AST: 21 U/L (ref 15–41)
Albumin: 4.2 g/dL (ref 3.5–5.0)
Alkaline Phosphatase: 77 U/L (ref 38–126)
Anion gap: 10 (ref 5–15)
BUN: 24 mg/dL — ABNORMAL HIGH (ref 8–23)
CO2: 23 mmol/L (ref 22–32)
Calcium: 9 mg/dL (ref 8.9–10.3)
Chloride: 105 mmol/L (ref 98–111)
Creatinine, Ser: 1.18 mg/dL — ABNORMAL HIGH (ref 0.44–1.00)
GFR, Estimated: 46 mL/min — ABNORMAL LOW (ref >60)
Glucose, Bld: 153 mg/dL — ABNORMAL HIGH (ref 70–99)
Potassium: 3.5 mmol/L (ref 3.5–5.1)
Sodium: 138 mmol/L (ref 135–145)
Total Bilirubin: 1 mg/dL (ref 0.3–1.2)
Total Protein: 7.6 g/dL (ref 6.5–8.1)

## 2022-05-09 LAB — URINALYSIS, ROUTINE W REFLEX MICROSCOPIC
Bilirubin Urine: NEGATIVE
Glucose, UA: NEGATIVE mg/dL
Hgb urine dipstick: NEGATIVE
Ketones, ur: NEGATIVE mg/dL
Nitrite: NEGATIVE
Protein, ur: NEGATIVE mg/dL
Specific Gravity, Urine: 1.009 (ref 1.005–1.030)
pH: 8 (ref 5.0–8.0)

## 2022-05-09 LAB — LIPASE, BLOOD: Lipase: 67 U/L — ABNORMAL HIGH (ref 11–51)

## 2022-05-09 MED ORDER — ONDANSETRON 4 MG PO TBDP: 4.0000 mg | ORAL_TABLET | Freq: Three times a day (TID) | ORAL | 0 refills | Status: AC | PRN

## 2022-05-09 MED ORDER — MECLIZINE HCL 25 MG PO TABS: 25.0000 mg | ORAL_TABLET | Freq: Three times a day (TID) | ORAL | 0 refills | Status: AC | PRN

## 2022-05-09 MED ORDER — LACTATED RINGERS IV BOLUS
1000.0000 mL | Freq: Once | INTRAVENOUS | Status: AC
  Administered 2022-05-09: 1000 mL via INTRAVENOUS

## 2022-05-09 MED ORDER — SODIUM CHLORIDE 0.9 % IV SOLN
12.5000 mg | Freq: Once | INTRAVENOUS | Status: AC
  Administered 2022-05-09: 12.5 mg via INTRAVENOUS
  Filled 2022-05-09: qty 12.5

## 2022-05-09 MED ORDER — MECLIZINE HCL 25 MG PO TABS
25.0000 mg | ORAL_TABLET | Freq: Once | ORAL | Status: AC
  Administered 2022-05-09: 25 mg via ORAL
  Filled 2022-05-09: qty 1

## 2022-05-09 NOTE — ED Triage Notes (Signed)
Patient states this morning developed dizziness and nausea, started vomiting en route.

## 2022-05-09 NOTE — ED Provider Notes (Signed)
[facility] Provider Note    Event Date/Time   First MD Initiated Contact with Patient 05/09/22 [phone]     (approximate)   History   Chief Complaint Emesis   HPI  Gina Middleton is a 87 y.o. female with past medical history of hypertension and nephrolithiasis who presents to the ED complaining of nausea and vomiting.  Patient reports that she had sudden onset of dizziness with nausea and vomiting around 6:30 AM this morning.  She states that she felt fine when she went to bed last night, also felt fine when she had gotten up around 6 to help her husband.  She describes the dizziness as a lightheadedness, denies any sensation of the room spinning around her.  She has not had any vision changes, speech changes, numbness, or weakness in her extremities.  She also denies any associated abdominal pain or diarrhea, has not had any chest pain or shortness of breath.  She denies any similar episodes in the past, has not had any recent sick contacts.     Physical Exam   Triage Vital Signs: ED Triage Vitals  Enc Vitals Group     BP      Pulse      Resp      Temp      Temp src      SpO2      Weight      Height      Head Circumference      Peak Flow      Pain Score      Pain Loc      Pain Edu?      Excl. in [place]?     Most recent vital signs: Vitals:   05/09/22 1311 05/09/22 1312  BP: 138/68   Pulse: 61   Resp: 18   Temp:  97.7 F (36.5 C)  SpO2: 98%     Constitutional: Alert and oriented. Eyes: Conjunctivae are normal. Head: Atraumatic. Nose: No congestion/rhinnorhea. Mouth/Throat: Mucous membranes are moist.  Cardiovascular: Normal rate, regular rhythm. Grossly normal heart sounds.  2+ radial pulses bilaterally. Respiratory: Normal respiratory effort.  No retractions. Lungs CTAB. Gastrointestinal: Soft and nontender. No distention. Musculoskeletal: No lower extremity tenderness nor edema.  Neurologic:  Normal speech and language. No gross  focal neurologic deficits are appreciated.    ED Results / Procedures / Treatments   Labs (all labs ordered are listed, but only abnormal results are displayed) Labs Reviewed  CBC WITH DIFFERENTIAL/PLATELET - Abnormal; Notable for the following components:      Result Value   Hemoglobin 11.9 (*)    All other components within normal limits  COMPREHENSIVE METABOLIC PANEL - Abnormal; Notable for the following components:   Glucose, Bld 153 (*)    BUN 24 (*)    Creatinine, Ser 1.18 (*)    GFR, Estimated 46 (*)    All other components within normal limits  LIPASE, BLOOD - Abnormal; Notable for the following components:   Lipase 67 (*)    All other components within normal limits  URINALYSIS, ROUTINE W REFLEX MICROSCOPIC - Abnormal; Notable for the following components:   Color, Urine STRAW (*)    APPearance CLEAR (*)    Leukocytes,Ua TRACE (*)    Bacteria, UA RARE (*)    All other components within normal limits     EKG  ED ECG REPORT I, Blake Divine, the attending physician, personally viewed and interpreted this ECG.   Date: 05/09/2022  EKG Time: 8:36  Rate: 67  Rhythm: normal sinus rhythm  Axis: Normal  Intervals:none  ST&T Change: None  RADIOLOGY CT head reviewed and interpreted by me with no hemorrhage or midline shift.  PROCEDURES:  Critical Care performed: No  Procedures   MEDICATIONS ORDERED IN ED: Medications  lactated ringers bolus 1,000 mL (0 mLs Intravenous Stopped 05/09/22 0930)  promethazine (PHENERGAN) 12.5 mg in sodium chloride 0.9 % 50 mL IVPB (0 mg Intravenous Stopped 05/09/22 0953)  meclizine (ANTIVERT) tablet 25 mg (25 mg Oral Given 05/09/22 1133)     IMPRESSION / MDM / ASSESSMENT AND PLAN / ED COURSE  I reviewed the triage vital signs and the nursing notes.                              87 y.o. female with past medical history of hypertension and nephrolithiasis who presents to the ED complaining of dizziness and vomiting since about  630 this morning.  Patient's presentation is most consistent with acute presentation with potential threat to life or bodily function.  Differential diagnosis includes, but is not limited to, stroke, TIA, arrhythmia, electrolyte abnormality, AKI, gastroenteritis, gastritis, vertigo.  Patient well-appearing and in no acute distress, vital signs are unremarkable.  She has no focal neurologic deficits on exam and I have low suspicion for stroke at this time, especially given she describes dizziness as lightheadedness.  Given acute onset however, we will check CT head for evidence of SAH or other acute process.  EKG shows no evidence of arrhythmia or ischemia, labs are pending at this time.  We will treat symptomatically with IV Phenergan as patient did not have relief with Zofran given by EMS, will also hydrate with IV fluids.  CT head is negative for acute process, labs are reassuring with renal function comparable to previous, no acute electrolyte abnormality noted.  No significant anemia or leukocytosis noted, LFTs are unremarkable.  Patient reports dizziness and nausea improved on reassessment, however she still continues to be unsteady on her feet when attempting to walk.  MRI was performed and negative for acute stroke or other finding.  Given reassuring workup, patient is appropriate for discharge home with PCP follow-up, will be prescribed Zofran and meclizine for use as needed.  She was counseled to return to the ED for new or worsening symptoms, patient agrees with plan.      FINAL CLINICAL IMPRESSION(S) / ED DIAGNOSES   Final diagnoses:  Nausea and vomiting, unspecified vomiting type  Dizziness     Rx / DC Orders   ED Discharge Orders          Ordered    ondansetron (ZOFRAN-ODT) 4 MG disintegrating tablet  Every 8 hours PRN        05/09/22 1433    meclizine (ANTIVERT) 25 MG tablet  3 times daily PRN        05/09/22 1433             Note:  This document was prepared using  Dragon voice recognition software and may include unintentional dictation errors.   Blake Divine, MD 05/09/22 1435

## 2022-05-21 ENCOUNTER — Ambulatory Visit (INDEPENDENT_AMBULATORY_CARE_PROVIDER_SITE_OTHER): Payer: Medicare PPO

## 2022-05-21 VITALS — BP 108/60 | Ht 63.0 in | Wt 129.0 lb

## 2022-05-21 DIAGNOSIS — Z0000 Encounter for general adult medical examination without abnormal findings: Secondary | ICD-10-CM | POA: Diagnosis not present

## 2022-05-21 NOTE — Progress Notes (Signed)
Subjective:   Gina Middleton is a 87 y.o. female who presents for Medicare Annual (Subsequent) preventive examination.  Review of Systems   Cardiac Risk Factors include: advanced age (>62men, >66 women);hypertension    Objective:    Today's Vitals   05/21/22 1359  Weight: 129 lb (58.5 kg)  Height: 5\' 3"  (1.6 m)   Body mass index is 22.85 kg/m.     05/09/2022    8:36 AM 04/11/2021    1:55 PM 04/19/2020    7:10 AM 04/10/2020    2:05 PM 03/15/2020    3:32 PM 12/15/2019   10:38 AM 08/25/2019    6:00 PM  Advanced Directives  Does Patient Have a Medical Advance Directive? Yes Yes Yes Yes Yes Yes Yes  Type of Advance Directive Living will [place];Living will [place];Living will  [place];Living will  Living will  Does patient want to make changes to medical advance directive? No - Patient declined Yes (Inpatient - patient defers changing a medical advance directive and declines information at this time) No - Patient declined    No - Patient declined  Copy of [place] in Chart?  Yes - validated most recent copy scanned in chart (See row information) Yes - validated most recent copy scanned in chart (See row information)  Yes - validated most recent copy scanned in chart (See row information)    Would patient like information on creating a medical advance directive? No - Patient declined          Current Medications (verified) Outpatient Encounter Medications as of 05/21/2022  Medication Sig   acetaminophen (TYLENOL) 325 MG tablet Take 325 mg by mouth every 6 (six) hours as needed.   amLODipine (NORVASC) 5 MG tablet Take 1 tablet (5 mg total) by mouth daily.   aspirin EC 81 MG tablet Take 81 mg by mouth daily. Swallow whole.   losartan-hydrochlorothiazide (HYZAAR) 50-12.5 MG tablet Take 1 tablet by mouth daily.   meclizine (ANTIVERT) 25 MG tablet Take 1 tablet (25 mg total) by mouth 3 (three) times daily  as needed for dizziness.   ondansetron (ZOFRAN-ODT) 4 MG disintegrating tablet Take 1 tablet (4 mg total) by mouth every 8 (eight) hours as needed for nausea or vomiting.   Vitamin D, Ergocalciferol, (DRISDOL) 1.25 MG (50000 UNIT) CAPS capsule Take 1 capsule (50,000 Units total) by mouth every 7 (seven) days.   No facility-administered encounter medications on file as of 05/21/2022.    Allergies (verified) Other, Hydrocodone-acetaminophen, Lisinopril, Oxycodone-acetaminophen, and Iodinated contrast media   History: Past Medical History:  Diagnosis Date   Actinic keratosis 10/08/2012   Left mid to distal tricep    Arthritis    Basal cell carcinoma 07/21/2018   Left inferior antihelix   Cataract    Complication of anesthesia    History of kidney stones    Hypertension    Pneumonia 03/2017   PONV (postoperative nausea and vomiting) 1980's   n/v with lap choley and n/v after 08-2019 hip surgery   Past Surgical History:  Procedure Laterality Date   APPENDECTOMY     BACK SURGERY  1969   CARPAL TUNNEL RELEASE Right 12/22/2019   Procedure: CARPAL TUNNEL RELEASE;  Surgeon: Dereck Leep, MD;  Location: [facility] ORS;  Service: Orthopedics;  Laterality: Right;   CHOLECYSTECTOMY     CYSTOSCOPY W/ RETROGRADES Left 04/09/2018   Procedure: CYSTOSCOPY WITH RETROGRADE PYELOGRAM;  Surgeon: Abbie Sons, MD;  Location: [facility] ORS;  Service: Urology;  Laterality: Left;   CYSTOSCOPY WITH STENT PLACEMENT Left 04/09/2018   Procedure: CYSTOSCOPY WITH STENT PLACEMENT-LEFT;  Surgeon: Abbie Sons, MD;  Location: [facility] ORS;  Service: Urology;  Laterality: Left;   CYSTOSCOPY WITH URETEROSCOPY Left 04/09/2018   Procedure: POSSIBLE URETEROSCOPY-LEFT;  Surgeon: Abbie Sons, MD;  Location: [facility] ORS;  Service: Urology;  Laterality: Left;   JOINT REPLACEMENT     SPINE SURGERY     TOTAL HIP ARTHROPLASTY Right 08/25/2019   Procedure: TOTAL HIP ARTHROPLASTY;  Surgeon: Dereck Leep, MD;  Location: [facility]  ORS;  Service: Orthopedics;  Laterality: Right;   TOTAL HIP ARTHROPLASTY Left 04/19/2020   Procedure: TOTAL HIP ARTHROPLASTY;  Surgeon: Dereck Leep, MD;  Location: [facility] ORS;  Service: Orthopedics;  Laterality: Left;   Family History  Problem Relation Age of Onset   Hypertension Mother    Alzheimer's disease Mother    Heart attack Father        x's 3   Heart disease Father    Diabetes Sister    Heart attack Sister    Breast cancer Maternal Grandmother    Social History   Socioeconomic History   Marital status: Married    Spouse name: Kyung Rudd   Number of children: 0   Years of education: [employer] education level: Master's degree (e.g., MA, MS, MEng, MEd, MSW, MBA)  Occupational History   Occupation: Retired Programmer, multimedia   Occupation: Works part-time at [employer] and [employer]   Occupation: [employer]    Employer: [employer]    Comment: Part Time  Tobacco Use   Smoking status: Never   Smokeless tobacco: Never  Vaping Use   Vaping Use: Never used  Substance and Sexual Activity   Alcohol use: Never   Drug use: Never   Sexual activity: Not Currently  Other Topics Concern   Not on file  Social History Narrative   Not on file   Social Determinants of Health   Financial Resource Strain: Low Risk  (05/19/2022)   Overall Financial Resource Strain (CARDIA)    Difficulty of Paying Living Expenses: Not hard at all  Food Insecurity: No Food Insecurity (05/19/2022)   Hunger Vital Sign    Worried About Running Out of Food in the Last Year: Never true    [place] in the Last Year: Never true  Transportation Needs: No Transportation Needs (05/19/2022)   PRAPARE - Hydrologist (Medical): No    Lack of Transportation (Non-Medical): No  Physical Activity: Insufficiently Active (05/19/2022)   Exercise Vital Sign    Days of Exercise per Week: 3 days    Minutes of Exercise per Session: 20 min  Stress: No Stress Concern  Present (05/19/2022)   [place]    Feeling of Stress : Not at all  Social Connections: Unknown (05/19/2022)   Social Connection and Isolation Panel [NHANES]    Frequency of Communication with Friends and Family: More than three times a week    Frequency of Social Gatherings with Friends and Family: Three times a week    Attends Religious Services: Not on file    Active Member of Clubs or Organizations: Yes    Attends Club or Organization Meetings: More than 4 times per year    Marital Status: Married    Tobacco Counseling Counseling given: Not Answered   Clinical Intake:  Pre-visit preparation completed: Yes  Pain : No/denies pain     BMI - recorded: 22.85 Nutritional Status: BMI of 19-24  Normal Nutritional Risks: None Diabetes: No  How often do you need to have someone help you when you read instructions, pamphlets, or other written materials from your doctor or pharmacy?: 1 - Never  Diabetic?no     Comments: lives w/husband Information entered by :: B.Sears Oran,LPN   Activities of Daily Living    05/19/2022    4:57 PM 11/05/2021    1:21 PM  In your present state of health, do you have any difficulty performing the following activities:  Hearing? 0 0  Vision? 0 0  Difficulty concentrating or making decisions? 0 0  Walking or climbing stairs? 0 0  Dressing or bathing? 0 0  Doing errands, shopping? 0 0  Preparing Food and eating ? N   Using the Toilet? N   In the past six months, have you accidently leaked urine? N   Do you have problems with loss of bowel control? N   Managing your Medications? N   Managing your Finances? N   Housekeeping or managing your Housekeeping? N     Patient Care Team: Gwyneth Sprout, FNP as PCP - General (Family Medicine) Ralene Bathe, MD (Dermatology) Abbie Sons, MD (Urology) Eulogio Bear, MD as Consulting Physician (Ophthalmology) Marry Guan, Laurice Record, MD (Orthopedic Surgery)  Indicate any recent Medical Services you may have received from other than [facility] providers in the past year (date may be approximate).     Assessment:   This is a routine wellness examination for [place].  Hearing/Vision screen Hearing Screening - Comments:: Adequate hearing Vision Screening - Comments:: Adequate vision w/glasses [facility]- Dr Edison Pace  Dietary issues and exercise activities discussed:     Goals Addressed   None    Depression Screen    05/21/2022    2:06 PM 11/05/2021    1:21 PM 04/11/2021    1:53 PM 04/11/2020    1:23 PM 03/17/2020    1:47 PM 03/15/2020    3:28 PM 12/03/2019    1:46 PM  PHQ 2/9 Scores  PHQ - 2 Score 0 0 0 0 0 0 0  PHQ- 9 Score  0  0       Fall Risk    05/19/2022    4:57 PM 11/05/2021    1:21 PM 04/11/2021    1:55 PM 04/11/2020    1:23 PM 03/17/2020    1:46 PM  [place] in the past year? 0 0 0 0 0  Number falls in past yr:  0 0 0 0  Injury with Fall?  0 0 0 0  Risk for fall due to :  No Fall Risks No Fall Risks No Fall Risks Orthopedic patient  Follow up  Falls evaluation completed Falls evaluation completed Falls evaluation completed Falls evaluation completed    [place]:  Any stairs in or around the home? Yes  If so, are there any without handrails? Yes  Home free of loose throw rugs in walkways, pet beds, electrical cords, etc? Yes  Adequate lighting in your home to reduce risk of falls? Yes   ASSISTIVE DEVICES UTILIZED TO PREVENT FALLS:  Life alert? No  Use of a cane, walker or w/c? No  Grab bars in the bathroom? No  Shower chair or bench in shower? No  Elevated toilet seat or a handicapped  toilet? Yes   TIMED UP AND GO:  Was the test performed? Yes .  Length of time to ambulate 10 feet: 8 sec.   Gait steady and fast without use of assistive device  Cognitive Function:        05/21/2022    2:11 PM 02/24/2018    1:44 PM  6CIT Screen  What Year? 0  points 0 points  What month? 0 points 0 points  What time? 0 points 0 points  Count back from 20 0 points 0 points  Months in reverse 0 points 0 points  Repeat phrase 0 points 0 points  Total Score 0 points 0 points    Immunizations Immunization History  Administered Date(s) Administered   PFIZER(Purple Top)SARS-COV-2 Vaccination 11/18/2019, 12/09/2019   Pneumococcal Conjugate-13 08/28/2016   Pneumococcal Polysaccharide-23 09/12/2017   Td 05/10/1995    TDAP status: Up to date  Flu Vaccine status: Declined, Education has been provided regarding the importance of this vaccine but patient still declined. Advised may receive this vaccine at local pharmacy or Health Dept. Aware to provide a copy of the vaccination record if obtained from local pharmacy or Health Dept. Verbalized acceptance and understanding.  Pneumococcal vaccine status: Up to date  Covid-19 vaccine status: Completed vaccines  Qualifies for Shingles Vaccine? Yes   Zostavax completed Yes   Shingrix Completed?: No.    Education has been provided regarding the importance of this vaccine. Patient has been advised to call insurance company to determine out of pocket expense if they have not yet received this vaccine. Advised may also receive vaccine at local pharmacy or Health Dept. Verbalized acceptance and understanding.  Screening Tests Health Maintenance  Topic Date Due   Zoster Vaccines- Shingrix (1 of 2) Never done   DTaP/Tdap/Td (2 - Tdap) 05/09/2005   COVID-19 Vaccine (3 - Pfizer risk series) 01/06/2020   INFLUENZA VACCINE  09/19/2022   Medicare Annual Wellness (AWV)  05/21/2023   DEXA SCAN  06/07/2026   Pneumonia Vaccine 65+ Years old  Completed   HPV VACCINES  Aged Out    Health Maintenance  Health Maintenance Due  Topic Date Due   Zoster Vaccines- Shingrix (1 of 2) Never done   DTaP/Tdap/Td (2 - Tdap) 05/09/2005   COVID-19 Vaccine (3 - Pfizer risk series) 01/06/2020    Colorectal cancer screening:  No longer required.   Mammogram status: No longer required due to age.  Bone Density status: Completed yes. Results reflect: Bone density results: NORMAL. Repeat every 5 years.no longer requierd  Lung Cancer Screening: (Low Dose CT Chest recommended if Age 53-80 years, 30 pack-year currently smoking OR have quit w/in 15years.) does not qualify.   Lung Cancer Screening Referral: yes  Additional Screening:  Hepatitis C Screening: does not qualify; Completed yes  Vision Screening: Recommended annual ophthalmology exams for early detection of glaucoma and other disorders of the eye. Is the patient up to date with their annual eye exam?  Yes  Who is the provider or what is the name of the office in which the patient attends annual eye exams? Dr Edison Pace If pt is not established with a provider, would they like to be referred to a provider to establish care? No .   Dental Screening: Recommended annual dental exams for proper oral hygiene  Community Resource Referral / Chronic Care Management: CRR required this visit?  No   CCM required this visit?  No      Plan:     I have personally  reviewed and noted the following in the patient's chart:   Medical and social history Use of alcohol, tobacco or illicit drugs  Current medications and supplements including opioid prescriptions. Patient is not currently taking opioid prescriptions. Functional ability and status Nutritional status Physical activity Advanced directives List of other physicians Hospitalizations, surgeries, and ER visits in previous 12 months Vitals Screenings to include cognitive, depression, and falls Referrals and appointments  In addition, I have reviewed and discussed with patient certain preventive protocols, quality metrics, and best practice recommendations. A written personalized care plan for preventive services as well as general preventive health recommendations were provided to patient.     Roger Shelter, LPN   [id number]   Nurse Notes: The patient states they are doing well and has no concerns or questions at this time.

## 2022-05-21 NOTE — Patient Instructions (Signed)
Ms. Gina Middleton , Thank you for taking time to come for your Medicare Wellness Visit. I appreciate your ongoing commitment to your health goals. Please review the following plan we discussed and let me know if I can assist you in the future.   These are the goals we discussed:  Goals      DIET - EAT MORE FRUITS AND VEGETABLES     DIET - INCREASE WATER INTAKE     Recommend to drink at least 6-8 8oz glasses of water per day.        This is a list of the screening recommended for you and due dates:  Health Maintenance  Topic Date Due   Zoster (Shingles) Vaccine (1 of 2) Never done   DTaP/Tdap/Td vaccine (2 - Tdap) 05/09/2005   COVID-19 Vaccine (3 - Pfizer risk series) 01/06/2020   Flu Shot  09/19/2022   Medicare Annual Wellness Visit  05/21/2023   DEXA scan (bone density measurement)  06/07/2026   Pneumonia Vaccine  Completed   HPV Vaccine  Aged Out    Advanced directives: yes  Conditions/risks identified: none  Next appointment: Follow up in one year for your annual wellness visit 05/27/2023 @2pm  in person   Preventive Care 85 Years and Older, Female Preventive care refers to lifestyle choices and visits with your health care provider that can promote health and wellness. What does preventive care include? A yearly physical exam. This is also called an annual well check. Dental exams once or twice a year. Routine eye exams. Ask your health care provider how often you should have your eyes checked. Personal lifestyle choices, including: Daily care of your teeth and gums. Regular physical activity. Eating a healthy diet. Avoiding tobacco and drug use. Limiting alcohol use. Practicing safe sex. Taking low-dose aspirin every day. Taking vitamin and mineral supplements as recommended by your health care provider. What happens during an annual well check? The services and screenings done by your health care provider during your annual well check will depend on your age, overall  health, lifestyle risk factors, and family history of disease. Counseling  Your health care provider may ask you questions about your: Alcohol use. Tobacco use. Drug use. Emotional well-being. Home and relationship well-being. Sexual activity. Eating habits. History of falls. Memory and ability to understand (cognition). Work and work Statistician. Reproductive health. Screening  You may have the following tests or measurements: Height, weight, and BMI. Blood pressure. Lipid and cholesterol levels. These may be checked every 5 years, or more frequently if you are over 26 years old. Skin check. Lung cancer screening. You may have this screening every year starting at age 9 if you have a 30-pack-year history of smoking and currently smoke or have quit within the past 15 years. Fecal occult blood test (FOBT) of the stool. You may have this test every year starting at age 80. Flexible sigmoidoscopy or colonoscopy. You may have a sigmoidoscopy every 5 years or a colonoscopy every 10 years starting at age 26. Hepatitis C blood test. Hepatitis B blood test. Sexually transmitted disease (STD) testing. Diabetes screening. This is done by checking your blood sugar (glucose) after you have not eaten for a while (fasting). You may have this done every 1-3 years. Bone density scan. This is done to screen for osteoporosis. You may have this done starting at age 17. Mammogram. This may be done every 1-2 years. Talk to your health care provider about how often you should have regular mammograms. Talk with  your health care provider about your test results, treatment options, and if necessary, the need for more tests. Vaccines  Your health care provider may recommend certain vaccines, such as: Influenza vaccine. This is recommended every year. Tetanus, diphtheria, and acellular pertussis (Tdap, Td) vaccine. You may need a Td booster every 10 years. Zoster vaccine. You may need this after age  52. Pneumococcal 13-valent conjugate (PCV13) vaccine. One dose is recommended after age 23. Pneumococcal polysaccharide (PPSV23) vaccine. One dose is recommended after age 25. Talk to your health care provider about which screenings and vaccines you need and how often you need them. This information is not intended to replace advice given to you by your health care provider. Make sure you discuss any questions you have with your health care provider. Document Released: 03/03/2015 Document Revised: 10/25/2015 Document Reviewed: 12/06/2014 Elsevier Interactive Patient Education  2017 [place] Prevention in the Home Falls can cause injuries. They can happen to people of all ages. There are many things you can do to make your home safe and to help prevent falls. What can I do on the outside of my home? Regularly fix the edges of walkways and driveways and fix any cracks. Remove anything that might make you trip as you walk through a door, such as a raised step or threshold. Trim any bushes or trees on the path to your home. Use bright outdoor lighting. Clear any walking paths of anything that might make someone trip, such as rocks or tools. Regularly check to see if handrails are loose or broken. Make sure that both sides of any steps have handrails. Any raised decks and porches should have guardrails on the edges. Have any leaves, snow, or ice cleared regularly. Use sand or salt on walking paths during winter. Clean up any spills in your garage right away. This includes oil or grease spills. What can I do in the bathroom? Use night lights. Install grab bars by the toilet and in the tub and shower. Do not use towel bars as grab bars. Use non-skid mats or decals in the tub or shower. If you need to sit down in the shower, use a plastic, non-slip stool. Keep the floor dry. Clean up any water that spills on the floor as soon as it happens. Remove soap buildup in the tub or shower  regularly. Attach bath mats securely with double-sided non-slip rug tape. Do not have throw rugs and other things on the floor that can make you trip. What can I do in the bedroom? Use night lights. Make sure that you have a light by your bed that is easy to reach. Do not use any sheets or blankets that are too big for your bed. They should not hang down onto the floor. Have a firm chair that has side arms. You can use this for support while you get dressed. Do not have throw rugs and other things on the floor that can make you trip. What can I do in the kitchen? Clean up any spills right away. Avoid walking on wet floors. Keep items that you use a lot in easy-to-reach places. If you need to reach something above you, use a strong step stool that has a grab bar. Keep electrical cords out of the way. Do not use floor polish or wax that makes floors slippery. If you must use wax, use non-skid floor wax. Do not have throw rugs and other things on the floor that can make you trip.  What can I do with my stairs? Do not leave any items on the stairs. Make sure that there are handrails on both sides of the stairs and use them. Fix handrails that are broken or loose. Make sure that handrails are as long as the stairways. Check any carpeting to make sure that it is firmly attached to the stairs. Fix any carpet that is loose or worn. Avoid having throw rugs at the top or bottom of the stairs. If you do have throw rugs, attach them to the floor with carpet tape. Make sure that you have a light switch at the top of the stairs and the bottom of the stairs. If you do not have them, ask someone to add them for you. What else can I do to help prevent falls? Wear shoes that: Do not have high heels. Have rubber bottoms. Are comfortable and fit you well. Are closed at the toe. Do not wear sandals. If you use a stepladder: Make sure that it is fully opened. Do not climb a closed stepladder. Make sure that  both sides of the stepladder are locked into place. Ask someone to hold it for you, if possible. Clearly mark and make sure that you can see: Any grab bars or handrails. First and last steps. Where the edge of each step is. Use tools that help you move around (mobility aids) if they are needed. These include: Canes. Walkers. Scooters. Crutches. Turn on the lights when you go into a dark area. Replace any light bulbs as soon as they burn out. Set up your furniture so you have a clear path. Avoid moving your furniture around. If any of your floors are uneven, fix them. If there are any pets around you, be aware of where they are. Review your medicines with your doctor. Some medicines can make you feel dizzy. This can increase your chance of falling. Ask your doctor what other things that you can do to help prevent falls. This information is not intended to replace advice given to you by your health care provider. Make sure you discuss any questions you have with your health care provider. Document Released: 12/01/2008 Document Revised: 07/13/2015 Document Reviewed: 03/11/2014 Elsevier Interactive Patient Education  2017 [employer].

## 2022-05-24 ENCOUNTER — Encounter: Payer: Medicare PPO | Admitting: Family Medicine

## 2022-05-30 NOTE — Progress Notes (Signed)
Complete physical exam  Patient: Gina Middleton   DOB: [birth year]   87 y.o. Female  MRN: [id number] Visit Date: 05/31/2022  Today's healthcare provider: Jacky Kindle, FNP  Re Introduced to nurse practitioner role and practice setting.  All questions answered.  Discussed provider/patient relationship and expectations.  Chief Complaint  Patient presents with   Medicare Wellness   Subjective    Gina Middleton is a 87 y.o. female who presents today for a complete physical exam.  She reports consuming a general diet. Home exercise routine includes walking 30 hrs per day. She generally feels fairly well. She reports sleeping fairly well. She does not have additional problems to discuss today.  HPI  Well Adult Physical: Patient here for a comprehensive physical exam.The patient reports no problems Do you take any herbs or supplements that were not prescribed by a doctor? no Are you taking calcium supplements? no Are you taking aspirin daily? Yes  Past Medical History:  Diagnosis Date   Actinic keratosis 10/08/2012   Left mid to distal tricep    Arthritis    Basal cell carcinoma 07/21/2018   Left inferior antihelix   Cataract    Complication of anesthesia    History of kidney stones    Hypertension    Mixed hyperlipidemia 05/31/2022   Pneumonia 03/2017   PONV (postoperative nausea and vomiting) 1980's   n/v with lap choley and n/v after 08-2019 hip surgery   Past Surgical History:  Procedure Laterality Date   APPENDECTOMY     BACK SURGERY  1969   CARPAL TUNNEL RELEASE Right 12/22/2019   Procedure: CARPAL TUNNEL RELEASE;  Surgeon: Donato Heinz, MD;  Location: [facility];  Service: Orthopedics;  Laterality: Right;   CHOLECYSTECTOMY     CYSTOSCOPY W/ RETROGRADES Left 04/09/2018   Procedure: CYSTOSCOPY WITH RETROGRADE PYELOGRAM;  Surgeon: Riki Altes, MD;  Location: [facility];  Service: Urology;  Laterality: Left;   CYSTOSCOPY WITH STENT PLACEMENT Left 04/09/2018    Procedure: CYSTOSCOPY WITH STENT PLACEMENT-LEFT;  Surgeon: Riki Altes, MD;  Location: [facility];  Service: Urology;  Laterality: Left;   CYSTOSCOPY WITH URETEROSCOPY Left 04/09/2018   Procedure: POSSIBLE URETEROSCOPY-LEFT;  Surgeon: Riki Altes, MD;  Location: [facility];  Service: Urology;  Laterality: Left;   JOINT REPLACEMENT     SPINE SURGERY     TOTAL HIP ARTHROPLASTY Right 08/25/2019   Procedure: TOTAL HIP ARTHROPLASTY;  Surgeon: Donato Heinz, MD;  Location: [facility];  Service: Orthopedics;  Laterality: Right;   TOTAL HIP ARTHROPLASTY Left 04/19/2020   Procedure: TOTAL HIP ARTHROPLASTY;  Surgeon: Donato Heinz, MD;  Location: [facility];  Service: Orthopedics;  Laterality: Left;   Social History   Socioeconomic History   Marital status: Married    Spouse name: Marcy Salvo   Number of children: 0   Years of education: [employer] education level: Master's degree (e.g., MA, MS, MEng, MEd, MSW, MBA)  Occupational History   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   Occupation: Works part-time at [employer] and [employer]   Occupation: [employer]    Employer: [employer]    Comment: Part Time  Tobacco Use   Smoking status: Never   Smokeless tobacco: Never  Vaping Use   Vaping Use: Never used  Substance and Sexual Activity   Alcohol use: Never   Drug use: Never   Sexual activity: Not Currently  Other Topics Concern   Not on file  Social History  Narrative   Not on file   Social Determinants of Health   Financial Resource Strain: Low Risk  (05/19/2022)   Overall Financial Resource Strain (CARDIA)    Difficulty of Paying Living Expenses: Not hard at all  Food Insecurity: No Food Insecurity (05/19/2022)   Hunger Vital Sign    Worried About Running Out of Food in the Last Year: Never true    Ran Out of Food in the Last Year: Never true  Transportation Needs: No Transportation Needs (05/19/2022)   PRAPARE - Administrator, Civil ServiceTransportation    Lack of Transportation (Medical):  No    Lack of Transportation (Non-Medical): No  Physical Activity: Insufficiently Active (05/19/2022)   Exercise Vital Sign    Days of Exercise per Week: 3 days    Minutes of Exercise per Session: 20 min  Stress: No Stress Concern Present (05/19/2022)   [employer] - Occupational Stress Questionnaire    Feeling of Stress : Not at all  Social Connections: Unknown (05/19/2022)   Social Connection and Isolation Panel [NHANES]    Frequency of Communication with Friends and Family: More than three times a week    Frequency of Social Gatherings with Friends and Family: Three times a week    Attends Religious Services: Not on file    Active Member of Clubs or Organizations: Yes    Attends Club or Organization Meetings: More than 4 times per year    Marital Status: Married  Intimate Partner Violence: Not At Risk (05/21/2022)   Humiliation, Afraid, Rape, and Kick questionnaire    Fear of Current or Ex-Partner: No    Emotionally Abused: No    Physically Abused: No    Sexually Abused: No   Family Status  Relation Name Status   Mother Windell MouldingRuth Deceased   Father Ree KidaJack Deceased       Died after his third heart attack   Sister Bonita QuinLinda Deceased       Died from a heart attack   MGM  Deceased   Family History  Problem Relation Age of Onset   Hypertension Mother    Alzheimer's disease Mother    Heart attack Father        x's 3   Heart disease Father    Diabetes Sister    Heart attack Sister    Breast cancer Maternal Grandmother    Allergies  Allergen Reactions   Other Anaphylaxis    A muscle relaxer in the 70s but cannot remember name of the medication that caused it   Hydrocodone-Acetaminophen Other (See Comments)    dizziness   Lisinopril     Hair thinning   Oxycodone-Acetaminophen Nausea And Vomiting   Iodinated Contrast Media Rash    Patient Care Team: Jacky KindlePayne, Rahiem Schellinger T, FNP as PCP - General (Family Medicine) Deirdre EvenerKowalski, David C, MD (Dermatology) Riki AltesStoioff, Scott  C, MD (Urology) [place] CraneKing, Bradley Mark, MD as Consulting Physician (Ophthalmology) Ernest PineHooten, Illene LabradorJames P, MD (Orthopedic Surgery)   Medications: Outpatient Medications Prior to Visit  Medication Sig   acetaminophen (TYLENOL) 325 MG tablet Take 325 mg by mouth every 6 (six) hours as needed.   aspirin EC 81 MG tablet Take 81 mg by mouth daily. Swallow whole.   meclizine (ANTIVERT) 25 MG tablet Take 1 tablet (25 mg total) by mouth 3 (three) times daily as needed for dizziness.   ondansetron (ZOFRAN-ODT) 4 MG disintegrating tablet Take 1 tablet (4 mg total) by mouth every 8 (eight) hours as needed for nausea or vomiting.  VITAMIN D PO Take 5,000 Units by mouth daily at 6 (six) AM.   [DISCONTINUED] amLODipine (NORVASC) 5 MG tablet Take 1 tablet (5 mg total) by mouth daily.   [DISCONTINUED] losartan-hydrochlorothiazide (HYZAAR) 50-12.5 MG tablet Take 1 tablet by mouth daily.   [DISCONTINUED] Vitamin D, Ergocalciferol, (DRISDOL) 1.25 MG (50000 UNIT) CAPS capsule Take 1 capsule (50,000 Units total) by mouth every 7 (seven) days.   No facility-administered medications prior to visit.    Review of Systems  Musculoskeletal:  Positive for arthralgias.  All other systems reviewed and are negative.   Objective    BP 123/76   Pulse 66   Ht 5' 0.75" (1.543 m)   Wt 129 lb (58.5 kg)   BMI 24.58 kg/m   Physical Exam Vitals and nursing note reviewed.  Constitutional:      General: She is awake. She is not in acute distress.    Appearance: Normal appearance. She is well-developed, well-groomed and normal weight. She is not ill-appearing, toxic-appearing or diaphoretic.  HENT:     Head: Normocephalic and atraumatic.     Jaw: There is normal jaw occlusion. No trismus, tenderness, swelling or pain on movement.     Right Ear: Hearing, tympanic membrane, ear canal and external ear normal. There is no impacted cerumen.     Left Ear: Hearing, tympanic membrane, ear canal and external ear normal. There is no  impacted cerumen.     Nose: Nose normal. No congestion or rhinorrhea.     Right Turbinates: Not enlarged, swollen or pale.     Left Turbinates: Not enlarged, swollen or pale.     Right Sinus: No maxillary sinus tenderness or frontal sinus tenderness.     Left Sinus: No maxillary sinus tenderness or frontal sinus tenderness.     Mouth/Throat:     Lips: Pink.     Mouth: Mucous membranes are moist. No injury.     Tongue: No lesions.     Pharynx: Oropharynx is clear. Uvula midline. No pharyngeal swelling, oropharyngeal exudate, posterior oropharyngeal erythema or uvula swelling.     Tonsils: No tonsillar exudate or tonsillar abscesses.  Eyes:     General: Lids are normal. Lids are everted, no foreign bodies appreciated. Vision grossly intact. Gaze aligned appropriately. No allergic shiner or visual field deficit.       Right eye: No discharge.        Left eye: No discharge.     Extraocular Movements: Extraocular movements intact.     Conjunctiva/sclera: Conjunctivae normal.     Right eye: Right conjunctiva is not injected. No exudate.    Left eye: Left conjunctiva is not injected. No exudate.    Pupils: Pupils are equal, round, and reactive to light.  Neck:     Thyroid: No thyroid mass, thyromegaly or thyroid tenderness.     Vascular: No carotid bruit.     Trachea: Trachea normal.  Cardiovascular:     Rate and Rhythm: Normal rate and regular rhythm.     Pulses: Normal pulses.          Carotid pulses are 2+ on the right side and 2+ on the left side.      Radial pulses are 2+ on the right side and 2+ on the left side.       Dorsalis pedis pulses are 2+ on the right side and 2+ on the left side.       Posterior tibial pulses are 2+ on the right side and 2+ on  the left side.     Heart sounds: Normal heart sounds, S1 normal and S2 normal. No murmur heard.    No friction rub. No gallop.  Pulmonary:     Effort: Pulmonary effort is normal. No respiratory distress.     Breath sounds: Normal  breath sounds and air entry. No stridor. No wheezing, rhonchi or rales.  Chest:     Chest wall: No tenderness.  Abdominal:     General: Abdomen is flat. Bowel sounds are normal. There is no distension.     Palpations: Abdomen is soft. There is no mass.     Tenderness: There is no abdominal tenderness. There is no right CVA tenderness, left CVA tenderness, guarding or rebound.     Hernia: No hernia is present.  Genitourinary:    Comments: Exam deferred; denies complaints Musculoskeletal:        General: No swelling, tenderness, deformity or signs of injury. Normal range of motion.     Cervical back: Full passive range of motion without pain, normal range of motion and neck supple. No edema, rigidity or tenderness. No muscular tenderness.     Right lower leg: No edema.     Left lower leg: No edema.  Lymphadenopathy:     Cervical: No cervical adenopathy.     Right cervical: No superficial, deep or posterior cervical adenopathy.    Left cervical: No superficial, deep or posterior cervical adenopathy.  Skin:    General: Skin is warm and dry.     Capillary Refill: Capillary refill takes less than 2 seconds.     Coloration: Skin is not jaundiced or pale.     Findings: No bruising, erythema, lesion or rash.  Neurological:     General: No focal deficit present.     Mental Status: She is alert and oriented to person, place, and time. Mental status is at baseline.     GCS: GCS eye subscore is 4. GCS verbal subscore is 5. GCS motor subscore is 6.     Sensory: Sensation is intact. No sensory deficit.     Motor: Motor function is intact. No weakness.     Coordination: Coordination is intact. Coordination normal.     Gait: Gait is intact. Gait normal.  Psychiatric:        Attention and Perception: Attention and perception normal.        Mood and Affect: Mood and affect normal.        Speech: Speech normal.        Behavior: Behavior normal. Behavior is cooperative.        Thought Content:  Thought content normal.        Cognition and Memory: Cognition and memory normal.        Judgment: Judgment normal.     Last depression screening scores    05/31/2022    2:42 PM 05/21/2022    2:06 PM 11/05/2021    1:21 PM  PHQ 2/9 Scores  PHQ - 2 Score 0 0 0  PHQ- 9 Score 0  0   Last fall risk screening    05/31/2022    2:42 PM  Fall Risk   Falls in the past year? 0   Last Audit-C alcohol use screening    05/31/2022    2:43 PM  Alcohol Use Disorder Test (AUDIT)  1. How often do you have a drink containing alcohol? 0  2. How many drinks containing alcohol do you have on a typical day when you are  drinking? 0  3. How often do you have six or more drinks on one occasion? 0  AUDIT-C Score 0   A score of 3 or more in women, and 4 or more in men indicates increased risk for alcohol abuse, EXCEPT if all of the points are from question 1   No results found for any visits on 05/31/22.  Assessment & Plan    Routine Health Maintenance and Physical Exam  Exercise Activities and Dietary recommendations  Goals      DIET - EAT MORE FRUITS AND VEGETABLES     DIET - INCREASE WATER INTAKE     Recommend to drink at least 6-8 8oz glasses of water per day.        Immunization History  Administered Date(s) Administered   PFIZER(Purple Top)SARS-COV-2 Vaccination 11/18/2019, 12/09/2019   Pneumococcal Conjugate-13 08/28/2016   Pneumococcal Polysaccharide-23 09/12/2017   Td 05/10/1995    Health Maintenance  Topic Date Due   Zoster Vaccines- Shingrix (1 of 2) Never done   DTaP/Tdap/Td (2 - Tdap) 05/09/2005   COVID-19 Vaccine (3 - Pfizer risk series) 01/06/2020   INFLUENZA VACCINE  09/19/2022   Medicare Annual Wellness (AWV)  05/21/2023   DEXA SCAN  06/07/2026   Pneumonia Vaccine 31+ Years old  Completed   HPV VACCINES  Aged Out    Discussed health benefits of physical activity, and encouraged her to engage in regular exercise appropriate for her age and condition.  Problem List  Items Addressed This Visit       Cardiovascular and Mediastinum   Hypertension    Chronic, stable BP at goal- 123/76 Continue norvasc 5 mg and Hyzaar 50-12.5 mg       Relevant Medications   amLODipine (NORVASC) 5 MG tablet   losartan-hydrochlorothiazide (HYZAAR) 50-12.5 MG tablet   Other Relevant Orders   CBC with Differential/Platelet     Genitourinary   Stage 3a chronic kidney disease    Chronic, stable given age Monitor CMP given use of Hyzaar 50-12.5mg       Relevant Orders   Comprehensive Metabolic Panel (CMET)     Other   Annual physical exam - Primary    UTD on vision and dental Things to do to keep yourself healthy  - Exercise at least 30-45 minutes a day, 3-4 days a week.  - Eat a low-fat diet with lots of fruits and vegetables, up to 7-9 servings per day.  - Seatbelts can save your life. Wear them always.  - Smoke detectors on every level of your home, check batteries every year.  - Eye Doctor - have an eye exam every 1-2 years  - Safe sex - if you may be exposed to STDs, use a condom.  - Alcohol -  If you drink, do it moderately, less than 2 drinks per day.  - Health Care Power of Attorney. Choose someone to speak for you if you are not able.  - Depression is common in our stressful world.If you're feeling down or losing interest in things you normally enjoy, please come in for a visit.  - Violence - If anyone is threatening or hurting you, please call immediately.       Iron deficiency anemia    Chronic, without bleeding or bruising Repeat CBC given previous concern for B12 deficiency       Relevant Orders   Comprehensive Metabolic Panel (CMET)   Mammogram declined    Plans to complete in odd years; normal in 2023  Mixed hyperlipidemia    Chronic, previously elevated Repeat LP; LDL goal <100 Discuss risk vs benefit of statin with age recommend diet low in saturated fat and regular exercise - 30 min at least 5 times per week       Relevant  Medications   amLODipine (NORVASC) 5 MG tablet   losartan-hydrochlorothiazide (HYZAAR) 50-12.5 MG tablet   Other Relevant Orders   Lipid panel   Prediabetes    Chronic, stable Repeat A1c Continue to recommend balanced, lower carb meals. Smaller meal size, adding snacks. Choosing water as drink of choice and increasing purposeful exercise. Not on diabetic medication at this time       Relevant Orders   Hemoglobin A1c   Return in about 6 months (around 11/30/2022) for chonic disease management- if desired .    Leilani Merl, FNP, have reviewed all documentation for this visit. The documentation on 05/31/22 for the exam, diagnosis, procedures, and orders are all accurate and complete.  Jacky Kindle, FNP  [facility] [phone] (phone) [phone] (fax)  [facility]

## 2022-05-31 ENCOUNTER — Encounter: Payer: Self-pay | Admitting: Family Medicine

## 2022-05-31 ENCOUNTER — Ambulatory Visit (INDEPENDENT_AMBULATORY_CARE_PROVIDER_SITE_OTHER): Payer: Medicare PPO | Admitting: Family Medicine

## 2022-05-31 VITALS — BP 123/76 | HR 66 | Ht 60.75 in | Wt 129.0 lb

## 2022-05-31 DIAGNOSIS — I10 Essential (primary) hypertension: Secondary | ICD-10-CM

## 2022-05-31 DIAGNOSIS — D509 Iron deficiency anemia, unspecified: Secondary | ICD-10-CM | POA: Diagnosis not present

## 2022-05-31 DIAGNOSIS — Z0000 Encounter for general adult medical examination without abnormal findings: Secondary | ICD-10-CM | POA: Diagnosis not present

## 2022-05-31 DIAGNOSIS — Z5320 Procedure and treatment not carried out because of patient's decision for unspecified reasons: Secondary | ICD-10-CM | POA: Insufficient documentation

## 2022-05-31 DIAGNOSIS — N1831 Chronic kidney disease, stage 3a: Secondary | ICD-10-CM | POA: Insufficient documentation

## 2022-05-31 DIAGNOSIS — E782 Mixed hyperlipidemia: Secondary | ICD-10-CM

## 2022-05-31 DIAGNOSIS — R7303 Prediabetes: Secondary | ICD-10-CM

## 2022-05-31 HISTORY — DX: Mixed hyperlipidemia: E78.2

## 2022-05-31 MED ORDER — AMLODIPINE BESYLATE 5 MG PO TABS: 5.0000 mg | ORAL_TABLET | Freq: Every day | ORAL | 3 refills | Status: DC

## 2022-05-31 MED ORDER — LOSARTAN POTASSIUM-HCTZ 50-12.5 MG PO TABS: 1.0000 | ORAL_TABLET | Freq: Every day | ORAL | 3 refills | Status: DC

## 2022-05-31 NOTE — Assessment & Plan Note (Signed)
Chronic, stable Repeat A1c Continue to recommend balanced, lower carb meals. Smaller meal size, adding snacks. Choosing water as drink of choice and increasing purposeful exercise. Not on diabetic medication at this time

## 2022-05-31 NOTE — Assessment & Plan Note (Signed)
Chronic, previously elevated Repeat LP; LDL goal <100 Discuss risk vs benefit of statin with age recommend diet low in saturated fat and regular exercise - 30 min at least 5 times per week

## 2022-05-31 NOTE — Assessment & Plan Note (Signed)
UTD on vision and dental Things to do to keep yourself healthy  - Exercise at least 30-45 minutes a day, 3-4 days a week.  - Eat a low-fat diet with lots of fruits and vegetables, up to 7-9 servings per day.  - Seatbelts can save your life. Wear them always.  - Smoke detectors on every level of your home, check batteries every year.  - Eye Doctor - have an eye exam every 1-2 years  - Safe sex - if you may be exposed to STDs, use a condom.  - Alcohol -  If you drink, do it moderately, less than 2 drinks per day.  - Health Care Power of Attorney. Choose someone to speak for you if you are not able.  - Depression is common in our stressful world.If you're feeling down or losing interest in things you normally enjoy, please come in for a visit.  - Violence - If anyone is threatening or hurting you, please call immediately.  

## 2022-05-31 NOTE — Assessment & Plan Note (Signed)
Plans to complete in odd years; normal in 2023

## 2022-05-31 NOTE — Assessment & Plan Note (Signed)
Chronic, without bleeding or bruising Repeat CBC given previous concern for B12 deficiency

## 2022-05-31 NOTE — Assessment & Plan Note (Signed)
Chronic, stable BP at goal- 123/76 Continue norvasc 5 mg and Hyzaar 50-12.5 mg

## 2022-05-31 NOTE — Assessment & Plan Note (Signed)
Chronic, stable given age Monitor CMP given use of Hyzaar 50-12.5mg 

## 2022-06-01 LAB — COMPREHENSIVE METABOLIC PANEL
ALT: 12 IU/L (ref 0–32)
AST: 18 IU/L (ref 0–40)
Albumin/Globulin Ratio: 1.7 (ref 1.2–2.2)
Albumin: 4.4 g/dL (ref 3.7–4.7)
Alkaline Phosphatase: 92 IU/L (ref 44–121)
BUN/Creatinine Ratio: 15 (ref 12–28)
BUN: 19 mg/dL (ref 8–27)
Bilirubin Total: 0.5 mg/dL (ref 0.0–1.2)
CO2: 24 mmol/L (ref 20–29)
Calcium: 9.5 mg/dL (ref 8.7–10.3)
Chloride: 103 mmol/L (ref 96–106)
Creatinine, Ser: 1.26 mg/dL — ABNORMAL HIGH (ref 0.57–1.00)
Globulin, Total: 2.6 g/dL (ref 1.5–4.5)
Glucose: 96 mg/dL (ref 70–99)
Potassium: 4.1 mmol/L (ref 3.5–5.2)
Sodium: 142 mmol/L (ref 134–144)
Total Protein: 7 g/dL (ref 6.0–8.5)
eGFR: 42 mL/min/{1.73_m2} — ABNORMAL LOW (ref >59)

## 2022-06-01 LAB — LIPID PANEL
Chol/HDL Ratio: 4.8 ratio — ABNORMAL HIGH (ref 0.0–4.4)
Cholesterol, Total: 159 mg/dL (ref 100–199)
HDL: 33 mg/dL — ABNORMAL LOW (ref >39)
LDL Chol Calc (NIH): 82 mg/dL (ref 0–99)
Triglycerides: 267 mg/dL — ABNORMAL HIGH (ref 0–149)
VLDL Cholesterol Cal: 44 mg/dL — ABNORMAL HIGH (ref 5–40)

## 2022-06-01 LAB — CBC WITH DIFFERENTIAL/PLATELET
Basophils Absolute: 0.1 10*3/uL (ref 0.0–0.2)
Basos: 1 %
EOS (ABSOLUTE): 0.2 10*3/uL (ref 0.0–0.4)
Eos: 2 %
Hematocrit: 34.2 % (ref 34.0–46.6)
Hemoglobin: 11.6 g/dL (ref 11.1–15.9)
Immature Grans (Abs): 0 10*3/uL (ref 0.0–0.1)
Immature Granulocytes: 0 %
Lymphocytes Absolute: 1.4 10*3/uL (ref 0.7–3.1)
Lymphs: 19 %
MCH: 29.7 pg (ref 26.6–33.0)
MCHC: 33.9 g/dL (ref 31.5–35.7)
MCV: 88 fL (ref 79–97)
Monocytes Absolute: 0.6 10*3/uL (ref 0.1–0.9)
Monocytes: 8 %
Neutrophils Absolute: 5.1 10*3/uL (ref 1.4–7.0)
Neutrophils: 70 %
Platelets: 222 10*3/uL (ref 150–450)
RBC: 3.9 x10E6/uL (ref 3.77–5.28)
RDW: 12.5 % (ref 11.7–15.4)
WBC: 7.4 10*3/uL (ref 3.4–10.8)

## 2022-06-01 LAB — HEMOGLOBIN A1C
Est. average glucose Bld gHb Est-mCnc: 126 mg/dL
Hgb A1c MFr Bld: 6 % — ABNORMAL HIGH (ref 4.8–5.6)

## 2022-06-02 NOTE — Progress Notes (Signed)
Labs continue to show slight decline in kidney function; ensure 48 oz/water per day to assist. Recommend reduction of diet soda and other drinks that serve your body as a diuretic.  Cholesterol remains stable.  Pre-diabetes remains. Continue to recommend balanced, lower carb meals. Smaller meal size, adding snacks. Choosing water as drink of choice and increasing purposeful exercise. Stable cell counts.

## 2022-06-12 DIAGNOSIS — H34812 Central retinal vein occlusion, left eye, with macular edema: Secondary | ICD-10-CM | POA: Diagnosis not present

## 2022-09-18 DIAGNOSIS — S0502XA Injury of conjunctiva and corneal abrasion without foreign body, left eye, initial encounter: Secondary | ICD-10-CM | POA: Diagnosis not present

## 2022-09-18 DIAGNOSIS — H34812 Central retinal vein occlusion, left eye, with macular edema: Secondary | ICD-10-CM | POA: Diagnosis not present

## 2022-11-18 ENCOUNTER — Ambulatory Visit: Payer: Medicare PPO | Admitting: Dermatology

## 2022-11-18 VITALS — BP 158/86 | HR 79

## 2022-11-18 DIAGNOSIS — L219 Seborrheic dermatitis, unspecified: Secondary | ICD-10-CM

## 2022-11-18 DIAGNOSIS — L820 Inflamed seborrheic keratosis: Secondary | ICD-10-CM

## 2022-11-18 DIAGNOSIS — Z872 Personal history of diseases of the skin and subcutaneous tissue: Secondary | ICD-10-CM | POA: Diagnosis not present

## 2022-11-18 DIAGNOSIS — L570 Actinic keratosis: Secondary | ICD-10-CM

## 2022-11-18 DIAGNOSIS — Z85828 Personal history of other malignant neoplasm of skin: Secondary | ICD-10-CM

## 2022-11-18 DIAGNOSIS — L821 Other seborrheic keratosis: Secondary | ICD-10-CM

## 2022-11-18 DIAGNOSIS — L578 Other skin changes due to chronic exposure to nonionizing radiation: Secondary | ICD-10-CM

## 2022-11-18 DIAGNOSIS — W908XXA Exposure to other nonionizing radiation, initial encounter: Secondary | ICD-10-CM

## 2022-11-18 MED ORDER — CLOBETASOL PROPIONATE 0.05 % EX SOLN: CUTANEOUS | 2 refills | Status: DC

## 2022-11-18 MED ORDER — KETOCONAZOLE 2 % EX SHAM: MEDICATED_SHAMPOO | CUTANEOUS | 3 refills | Status: DC

## 2022-11-18 MED ORDER — FLUOCINOLONE ACETONIDE SCALP 0.01 % EX OIL: TOPICAL_OIL | CUTANEOUS | 2 refills | Status: DC

## 2022-11-18 NOTE — Progress Notes (Signed)
Follow-Up Visit   Subjective  Gina Middleton is a 87 y.o. female who presents for the following:  The patient has spots on her face, scalp and neck be evaluated, some may be new or changing and the patient may have concern these could be cancer. Some on neck are irritating.  She has h/o Aks treated in past   The following portions of the chart were reviewed this encounter and updated as appropriate: medications, allergies, medical history  Review of Systems:  No other skin or systemic complaints except as noted in HPI or Assessment and Plan.  Objective  Well appearing patient in no apparent distress; mood and affect are within normal limits.   A focused examination was performed of the following areas:face,neck, scalp   Relevant exam findings are noted in the Assessment and Plan.  right neck x 4 (4) Stuck-on, waxy, tan-brown papule erythema/crusting-- Discussed benign etiology and prognosis.   right zygoma x 1, right malar cheek x 1, left nasal root x 1, mid nasal dorsum x 1, left paranasal x 1, left zygoma x 2, left medial cheek x 1 (8) Erythematous thin papules/macules with gritty scale.     Assessment & Plan   History of basal cell carcinoma (BCC) Left inferior antihelix   Clear. Observe for recurrence. Call clinic for new or changing lesions.  Recommend regular skin exams, daily broad-spectrum spf 30+ sunscreen use, and photoprotection.     Inflamed seborrheic keratosis (4) right neck x 4  Symptomatic, irritating, patient would like treated.   Destruction of lesion - right neck x 4 (4)  Destruction method: cryotherapy   Informed consent: discussed and consent obtained   Lesion destroyed using liquid nitrogen: Yes   Region frozen until ice ball extended beyond lesion: Yes   Outcome: patient tolerated procedure well with no complications   Post-procedure details: wound care instructions given   Additional details:  Prior to procedure, discussed risks of  blister formation, small wound, skin dyspigmentation, or rare scar following cryotherapy. Recommend Vaseline ointment to treated areas while healing.   AK (actinic keratosis) (8) right zygoma x 1, right malar cheek x 1, left nasal root x 1, mid nasal dorsum x 1, left paranasal x 1, left zygoma x 2, left medial cheek x 1  Actinic keratoses are precancerous spots that appear secondary to cumulative UV radiation exposure/sun exposure over time. They are chronic with expected duration over 1 year. A portion of actinic keratoses will progress to squamous cell carcinoma of the skin. It is not possible to reliably predict which spots will progress to skin cancer and so treatment is recommended to prevent development of skin cancer.  Recommend daily broad spectrum sunscreen SPF 30+ to sun-exposed areas, reapply every 2 hours as needed.  Recommend staying in the shade or wearing long sleeves, sun glasses (UVA+UVB protection) and wide brim hats (4-inch brim around the entire circumference of the hat). Call for new or changing lesions.   Destruction of lesion - right zygoma x 1, right malar cheek x 1, left nasal root x 1, mid nasal dorsum x 1, left paranasal x 1, left zygoma x 2, left medial cheek x 1 (8)  Destruction method: cryotherapy   Informed consent: discussed and consent obtained   Lesion destroyed using liquid nitrogen: Yes   Region frozen until ice ball extended beyond lesion: Yes   Outcome: patient tolerated procedure well with no complications   Post-procedure details: wound care instructions given   Additional details:  Prior to procedure, discussed risks of blister formation, small wound, skin dyspigmentation, or rare scar following cryotherapy. Recommend Vaseline ointment to treated areas while healing.    SEBORRHEIC KERATOSIS - Stuck-on, waxy, tan-brown papules and/or plaques  - Benign-appearing - Discussed benign etiology and prognosis. - Observe - Call for any changes  ACTINIC  DAMAGE WITH PRECANCEROUS ACTINIC KERATOSES Counseling for Topical Chemotherapy Management: Patient exhibits: - Severe, confluent actinic changes with pre-cancerous actinic keratoses that is secondary to cumulative UV radiation exposure over time - Condition that is severe; chronic, not at goal. - diffuse scaly erythematous macules and papules with underlying dyspigmentation - Discussed Prescription "Field Treatment" topical Chemotherapy for Severe, Chronic Confluent Actinic Changes with Pre-Cancerous Actinic Keratoses Field treatment involves treatment of an entire area of skin that has confluent Actinic Changes (Sun/ Ultraviolet light damage) and PreCancerous Actinic Keratoses by method of PhotoDynamic Therapy (PDT) and/or prescription Topical Chemotherapy agents such as 5-fluorouracil, 5-fluorouracil/calcipotriene, and/or imiquimod.  The purpose is to decrease the number of clinically evident and subclinical PreCancerous lesions to prevent progression to development of skin cancer by chemically destroying early precancer changes that may or may not be visible.  It has been shown to reduce the risk of developing skin cancer in the treated area. As a result of treatment, redness, scaling, crusting, and open sores may occur during treatment course. One or more than one of these methods may be used and may have to be used several times to control, suppress and eliminate the PreCancerous changes. Discussed treatment course, expected reaction, and possible side effects. - Recommend daily broad spectrum sunscreen SPF 30+ to sun-exposed areas, reapply every 2 hours as needed.  - Staying in the shade or wearing long sleeves, sun glasses (UVA+UVB protection) and wide brim hats (4-inch brim around the entire circumference of the hat) are also recommended. - Call for new or changing lesions.  Recommend red light with debridement-face  Return to the office in 6 week after this treatment   SEBORRHEIC DERMATITIS  with Pityriasis Amiantacea Exam:  posterior crown  with adherent white keratotic scale/hair matting  Chronic and persistent condition with duration or expected duration over one year. Condition is symptomatic/ bothersome to patient. Not currently at goal.   Seborrheic Dermatitis is a chronic persistent rash characterized by pinkness and scaling most commonly of the mid face but also can occur on the scalp (dandruff), ears; mid chest, mid back and groin.  It tends to be exacerbated by stress and cooler weather.  People who have neurologic disease may experience new onset or exacerbation of existing seborrheic dermatitis.  The condition is not curable but treatable and can be controlled.  Treatment Plan: Start Ketoconazole 2% shampoo apply three times per week, massage into scalp and leave in for 10 minutes before rinsing out    Start otc Head and shoulders alternating with Ketoconazole shampoo   Start Clobetasol solution apply to scalp qd-bid prn, Avoid applying to face, groin, and axilla. Use as directed. Long-term use can cause thinning of the skin.  Start Fluocinolone scalp oil apply to scalp at bedtime, occlude with shower cap wash out in the morning. Apply nightly until scalp has resolved, then weekly for maintenance  Topical steroids (such as triamcinolone, fluocinolone, fluocinonide, mometasone, clobetasol, halobetasol, betamethasone, hydrocortisone) can cause thinning and lightening of the skin if they are used for too long in the same area. Your physician has selected the right strength medicine for your problem and area affected on the body. Please use your  medication only as directed by your physician to prevent side effects.      Return in about 6 months (around 05/18/2023) for Aks.  I, Angelique Holm, CMA, am acting as scribe for Willeen Niece, MD .   Documentation: I have reviewed the above documentation for accuracy and completeness, and I agree with the above.  Willeen Niece,  MD

## 2022-11-18 NOTE — Patient Instructions (Addendum)

## 2022-12-02 ENCOUNTER — Ambulatory Visit: Payer: Medicare PPO | Admitting: Family Medicine

## 2022-12-02 ENCOUNTER — Encounter: Payer: Self-pay | Admitting: Family Medicine

## 2022-12-02 VITALS — BP 148/76 | HR 75 | Ht 63.0 in | Wt 135.4 lb

## 2022-12-02 DIAGNOSIS — E782 Mixed hyperlipidemia: Secondary | ICD-10-CM | POA: Diagnosis not present

## 2022-12-02 DIAGNOSIS — E538 Deficiency of other specified B group vitamins: Secondary | ICD-10-CM | POA: Diagnosis not present

## 2022-12-02 DIAGNOSIS — R7303 Prediabetes: Secondary | ICD-10-CM | POA: Diagnosis not present

## 2022-12-02 DIAGNOSIS — E559 Vitamin D deficiency, unspecified: Secondary | ICD-10-CM | POA: Diagnosis not present

## 2022-12-02 DIAGNOSIS — N1831 Chronic kidney disease, stage 3a: Secondary | ICD-10-CM | POA: Diagnosis not present

## 2022-12-02 DIAGNOSIS — I10 Essential (primary) hypertension: Secondary | ICD-10-CM | POA: Diagnosis not present

## 2022-12-02 DIAGNOSIS — D509 Iron deficiency anemia, unspecified: Secondary | ICD-10-CM | POA: Diagnosis not present

## 2022-12-02 MED ORDER — LOSARTAN POTASSIUM-HCTZ 50-12.5 MG PO TABS: 1.0000 | ORAL_TABLET | Freq: Every day | ORAL | 3 refills | Status: DC

## 2022-12-02 MED ORDER — AMLODIPINE BESYLATE 5 MG PO TABS: 5.0000 mg | ORAL_TABLET | Freq: Every day | ORAL | 3 refills | Status: AC

## 2022-12-02 NOTE — Assessment & Plan Note (Signed)
Chronic, no concerns Repeat labs

## 2022-12-02 NOTE — Assessment & Plan Note (Signed)
Chronic, no concerns Repeat A1c Continue to recommend balanced, lower carb meals. Smaller meal size, adding snacks. Choosing water as drink of choice and increasing purposeful exercise.

## 2022-12-02 NOTE — Assessment & Plan Note (Signed)
Chronic, previously elevated Not on statin d/t age/pt preference recommend diet low in saturated fat and regular exercise - 30 min at least 5 times per week

## 2022-12-02 NOTE — Assessment & Plan Note (Signed)
Chronic, elevated Pt reports both norvac and hyzaar compliance Repeat CBC CMP TSH

## 2022-12-02 NOTE — Progress Notes (Signed)
Established patient visit   Patient: Gina Middleton   DOB: [birth year]   87 y.o. Female  MRN: [id number] Visit Date: 12/02/2022  Today's healthcare provider: Jacky Kindle, FNP  Introduced to nurse practitioner role and practice setting.  All questions answered.  Discussed provider/patient relationship and expectations.  Subjective    HPI HPI     Medical Management of Chronic Issues    Additional comments: 6 month follow-up      Last edited by Acey Lav, CMA on 12/02/2022  1:06 PM.    The patient, with a history of hypertension, presents for a six-month follow-up. She reports that her blood pressure has been slightly elevated recently, which she attributes to increased stress and physical activity at home due to caring for her partner who is awaiting knee surgery. Despite the elevated blood pressure, she has been adhering to her prescribed medication regimen.  The patient also reports a recent dermatology visit with Dr. Roseanne Reno, during which some skin lesions were frozen off her face. She was also given medication for a scaly spot on her scalp, which she reports is improving.  In addition, the patient has a history of dizziness, but reports no recent episodes since March 21st. She has not had any repeat symptoms since; however, does have access to meclizine to assist if needed.  Lastly, the patient expresses concern about her kidney function, which had shown some abnormalities in previous lab results. She is due for a kidney check-up at the end of August or early September.  Medications: Outpatient Medications Prior to Visit  Medication Sig   acetaminophen (TYLENOL) 325 MG tablet Take 325 mg by mouth every 6 (six) hours as needed.   aspirin EC 81 MG tablet Take 81 mg by mouth daily. Swallow whole.   clobetasol (TEMOVATE) 0.05 % external solution Apply to itchy irritated scalp qd-bid prn   Fluocinolone Acetonide Scalp 0.01 % OIL Apply to scalp at bedtime cover with a  cap, wash out in the morning   ketoconazole (NIZORAL) 2 % shampoo apply three times per week, massage into scalp and leave in for 10 minutes before rinsing out   meclizine (ANTIVERT) 25 MG tablet Take 1 tablet (25 mg total) by mouth 3 (three) times daily as needed for dizziness.   ondansetron (ZOFRAN-ODT) 4 MG disintegrating tablet Take 1 tablet (4 mg total) by mouth every 8 (eight) hours as needed for nausea or vomiting.   VITAMIN D PO Take 5,000 Units by mouth daily at 6 (six) AM.   [DISCONTINUED] amLODipine (NORVASC) 5 MG tablet Take 1 tablet (5 mg total) by mouth daily.   [DISCONTINUED] losartan-hydrochlorothiazide (HYZAAR) 50-12.5 MG tablet Take 1 tablet by mouth daily.   No facility-administered medications prior to visit.     Objective    BP (!) 148/76 (BP Location: Left Arm, Patient Position: Sitting, Cuff Size: Normal)   Pulse 75   Ht 5\' 3"  (1.6 m)   Wt 135 lb 6.4 oz (61.4 kg)   SpO2 100%   BMI 23.99 kg/m   Physical Exam Vitals and nursing note reviewed.  Constitutional:      General: She is not in acute distress.    Appearance: Normal appearance. She is normal weight. She is not ill-appearing, toxic-appearing or diaphoretic.  HENT:     Head: Normocephalic and atraumatic.  Cardiovascular:     Rate and Rhythm: Normal rate and regular rhythm.     Pulses: Normal pulses.     Heart sounds: Normal  heart sounds. No murmur heard.    No friction rub. No gallop.  Pulmonary:     Effort: Pulmonary effort is normal. No respiratory distress.     Breath sounds: Normal breath sounds. No stridor. No wheezing, rhonchi or rales.  Chest:     Chest wall: No tenderness.  Musculoskeletal:        General: No swelling, tenderness, deformity or signs of injury. Normal range of motion.     Right lower leg: No edema.     Left lower leg: No edema.  Skin:    General: Skin is warm and dry.     Capillary Refill: Capillary refill takes less than 2 seconds.     Coloration: Skin is not jaundiced or  pale.     Findings: No bruising, erythema, lesion or rash.  Neurological:     General: No focal deficit present.     Mental Status: She is alert and oriented to person, place, and time. Mental status is at baseline.     Cranial Nerves: No cranial nerve deficit.     Sensory: No sensory deficit.     Motor: No weakness.     Coordination: Coordination normal.  Psychiatric:        Mood and Affect: Mood normal.        Behavior: Behavior normal.        Thought Content: Thought content normal.        Judgment: Judgment normal.     No results found for any visits on 12/02/22.  Assessment & Plan     Problem List Items Addressed This Visit       Cardiovascular and Mediastinum   Hypertension    Chronic, elevated Pt reports both norvac and hyzaar compliance Repeat CBC CMP TSH      Relevant Medications   amLODipine (NORVASC) 5 MG tablet   losartan-hydrochlorothiazide (HYZAAR) 50-12.5 MG tablet   Other Relevant Orders   CBC with Differential/Platelet   TSH   Comprehensive Metabolic Panel (CMET)     Genitourinary   Stage 3a chronic kidney disease (HCC) - Primary    Chronic, repeat CMP Denies concerns; however, SBP is elevated      Relevant Orders   CBC with Differential/Platelet   Comprehensive Metabolic Panel (CMET)     Other   Avitaminosis D    Chronic, no concerns Repeat labs      Relevant Orders   Vitamin D (25 hydroxy)   B12 deficiency    Chronic, no concerns Repeat labs      Relevant Orders   B12 and Folate Panel   Iron deficiency anemia    Chronic, endorses fatigue; reports "she's doing everything" as her spouse is awaiting knee sx in Nov Repeat labs      Relevant Orders   CBC with Differential/Platelet   Iron, TIBC and Ferritin Panel   Mixed hyperlipidemia    Chronic, previously elevated Not on statin d/t age/pt preference recommend diet low in saturated fat and regular exercise - 30 min at least 5 times per week       Relevant Medications    amLODipine (NORVASC) 5 MG tablet   losartan-hydrochlorothiazide (HYZAAR) 50-12.5 MG tablet   Other Relevant Orders   Lipid panel   Prediabetes    Chronic, no concerns Repeat A1c Continue to recommend balanced, lower carb meals. Smaller meal size, adding snacks. Choosing water as drink of choice and increasing purposeful exercise.       Relevant Orders   Hemoglobin A1c  Hypertension Elevated blood pressure likely due to increased stress at home. Patient is compliant with medications. -Continue current antihypertensive regimen. -Check kidney function due to previous elevation and current stressors.  Skin Lesions Recent treatment by dermatologist, Dr. Roseanne Reno, with improvement noted. -Continue current treatment as prescribed by dermatologist.  General Health Maintenance -Order labs today to monitor overall health. -Next mammogram due in 2025. -Next kidney checkup due end of August/early September 2025. -Declined flu vaccine.     Leilani Merl, FNP, have reviewed all documentation for this visit. The documentation on 12/02/22 for the exam, diagnosis, procedures, and orders are all accurate and complete.  Jacky Kindle, FNP  [facility] [phone] (phone) [phone] (fax)  [facility]

## 2022-12-02 NOTE — Assessment & Plan Note (Signed)
Chronic, endorses fatigue; reports "she's doing everything" as her spouse is awaiting knee sx in Nov Repeat labs

## 2022-12-02 NOTE — Assessment & Plan Note (Signed)
Chronic, repeat CMP Denies concerns; however, SBP is elevated

## 2022-12-03 LAB — COMPREHENSIVE METABOLIC PANEL
ALT: 16 [IU]/L (ref 0–32)
AST: 20 [IU]/L (ref 0–40)
Albumin: 4.3 g/dL (ref 3.7–4.7)
Alkaline Phosphatase: 85 [IU]/L (ref 44–121)
BUN/Creatinine Ratio: 15 (ref 12–28)
BUN: 22 mg/dL (ref 8–27)
Bilirubin Total: 0.6 mg/dL (ref 0.0–1.2)
CO2: 24 mmol/L (ref 20–29)
Calcium: 9.4 mg/dL (ref 8.7–10.3)
Chloride: 104 mmol/L (ref 96–106)
Creatinine, Ser: 1.44 mg/dL — ABNORMAL HIGH (ref 0.57–1.00)
Globulin, Total: 2.7 g/dL (ref 1.5–4.5)
Glucose: 101 mg/dL — ABNORMAL HIGH (ref 70–99)
Potassium: 4.4 mmol/L (ref 3.5–5.2)
Sodium: 141 mmol/L (ref 134–144)
Total Protein: 7 g/dL (ref 6.0–8.5)
eGFR: 36 mL/min/{1.73_m2} — ABNORMAL LOW (ref >59)

## 2022-12-03 LAB — CBC WITH DIFFERENTIAL/PLATELET
Basophils Absolute: 0.1 10*3/uL (ref 0.0–0.2)
Basos: 1 %
EOS (ABSOLUTE): 0.1 10*3/uL (ref 0.0–0.4)
Eos: 2 %
Hematocrit: 34.9 % (ref 34.0–46.6)
Hemoglobin: 11.7 g/dL (ref 11.1–15.9)
Immature Grans (Abs): 0 10*3/uL (ref 0.0–0.1)
Immature Granulocytes: 0 %
Lymphocytes Absolute: 1.5 10*3/uL (ref 0.7–3.1)
Lymphs: 24 %
MCH: 30.3 pg (ref 26.6–33.0)
MCHC: 33.5 g/dL (ref 31.5–35.7)
MCV: 90 fL (ref 79–97)
Monocytes Absolute: 0.6 10*3/uL (ref 0.1–0.9)
Monocytes: 9 %
Neutrophils Absolute: 4.2 10*3/uL (ref 1.4–7.0)
Neutrophils: 64 %
Platelets: 213 10*3/uL (ref 150–450)
RBC: 3.86 x10E6/uL (ref 3.77–5.28)
RDW: 12.4 % (ref 11.7–15.4)
WBC: 6.6 10*3/uL (ref 3.4–10.8)

## 2022-12-03 LAB — IRON,TIBC AND FERRITIN PANEL
Ferritin: 99 ng/mL (ref 15–150)
Iron Saturation: 24 % (ref 15–55)
Iron: 70 ug/dL (ref 27–139)
Total Iron Binding Capacity: 295 ug/dL (ref 250–450)
UIBC: 225 ug/dL (ref 118–369)

## 2022-12-03 LAB — LIPID PANEL
Chol/HDL Ratio: 5.4 {ratio} — ABNORMAL HIGH (ref 0.0–4.4)
Cholesterol, Total: 173 mg/dL (ref 100–199)
HDL: 32 mg/dL — ABNORMAL LOW (ref >39)
LDL Chol Calc (NIH): 95 mg/dL (ref 0–99)
Triglycerides: 274 mg/dL — ABNORMAL HIGH (ref 0–149)
VLDL Cholesterol Cal: 46 mg/dL — ABNORMAL HIGH (ref 5–40)

## 2022-12-03 LAB — B12 AND FOLATE PANEL
Folate: 7.2 ng/mL (ref >3.0)
Vitamin B-12: 357 pg/mL (ref 232–1245)

## 2022-12-03 LAB — VITAMIN D 25 HYDROXY (VIT D DEFICIENCY, FRACTURES): Vit D, 25-Hydroxy: 35.9 ng/mL (ref 30.0–100.0)

## 2022-12-03 LAB — HEMOGLOBIN A1C
Est. average glucose Bld gHb Est-mCnc: 123 mg/dL
Hgb A1c MFr Bld: 5.9 % — ABNORMAL HIGH (ref 4.8–5.6)

## 2022-12-03 LAB — TSH: TSH: 1.35 u[IU]/mL (ref 0.450–4.500)

## 2022-12-03 NOTE — Progress Notes (Signed)
Please call per pt request: -stable pre-diabetes. -stable cholesterol; I continue to recommend low fat diet and routine exercise as able.  -creatinine is elevated from 6 months ago; ensure water is drink of choice and blood pressure is well controlled. Recommend consult with nephrology following your husband's procedure if elevation remains in 3 month repeat CMP. -Vit D is improved; but remains low. Continue to recommend 5000 IU daily to assist bone health.

## 2022-12-04 ENCOUNTER — Other Ambulatory Visit: Payer: Self-pay

## 2022-12-04 DIAGNOSIS — N1831 Chronic kidney disease, stage 3a: Secondary | ICD-10-CM

## 2022-12-11 DIAGNOSIS — H40003 Preglaucoma, unspecified, bilateral: Secondary | ICD-10-CM | POA: Diagnosis not present

## 2022-12-11 DIAGNOSIS — H34812 Central retinal vein occlusion, left eye, with macular edema: Secondary | ICD-10-CM | POA: Diagnosis not present

## 2023-01-14 DIAGNOSIS — H40003 Preglaucoma, unspecified, bilateral: Secondary | ICD-10-CM | POA: Diagnosis not present

## 2023-01-23 DIAGNOSIS — H401131 Primary open-angle glaucoma, bilateral, mild stage: Secondary | ICD-10-CM | POA: Diagnosis not present

## 2023-01-23 DIAGNOSIS — H34812 Central retinal vein occlusion, left eye, with macular edema: Secondary | ICD-10-CM | POA: Diagnosis not present

## 2023-01-23 DIAGNOSIS — H2513 Age-related nuclear cataract, bilateral: Secondary | ICD-10-CM | POA: Diagnosis not present

## 2023-03-05 DIAGNOSIS — H34812 Central retinal vein occlusion, left eye, with macular edema: Secondary | ICD-10-CM | POA: Diagnosis not present

## 2023-06-03 ENCOUNTER — Encounter: Payer: Self-pay | Admitting: Dermatology

## 2023-06-03 ENCOUNTER — Ambulatory Visit: Payer: Medicare PPO | Admitting: Dermatology

## 2023-06-03 DIAGNOSIS — L570 Actinic keratosis: Secondary | ICD-10-CM | POA: Diagnosis not present

## 2023-06-03 DIAGNOSIS — W908XXA Exposure to other nonionizing radiation, initial encounter: Secondary | ICD-10-CM

## 2023-06-03 DIAGNOSIS — L821 Other seborrheic keratosis: Secondary | ICD-10-CM

## 2023-06-03 DIAGNOSIS — L814 Other melanin hyperpigmentation: Secondary | ICD-10-CM

## 2023-06-03 DIAGNOSIS — L578 Other skin changes due to chronic exposure to nonionizing radiation: Secondary | ICD-10-CM | POA: Diagnosis not present

## 2023-06-03 DIAGNOSIS — L305 Pityriasis alba: Secondary | ICD-10-CM

## 2023-06-03 DIAGNOSIS — D229 Melanocytic nevi, unspecified: Secondary | ICD-10-CM

## 2023-06-03 DIAGNOSIS — R238 Other skin changes: Secondary | ICD-10-CM

## 2023-06-03 DIAGNOSIS — L219 Seborrheic dermatitis, unspecified: Secondary | ICD-10-CM

## 2023-06-03 NOTE — Patient Instructions (Addendum)
 Cryotherapy Aftercare  Wash gently with soap and water everyday.   Apply Vaseline Jelly daily until healed.     Continue Ketoconazole 2% shampoo apply three times per week, massage into scalp and leave in for 10 minutes before rinsing out     Continue otc Head and shoulders alternating with Ketoconazole shampoo    Continue Clobetasol solution apply to scalp qd-bid prn, Avoid applying to face, groin, and axilla. Use as directed. Long-term use can cause thinning of the skin.  Continue Fluocinolone scalp oil apply to scalp at bedtime, occlude with shower cap wash out in the morning. Apply nightly until scalp has resolved, then weekly for maintenance   Topical steroids (such as triamcinolone, fluocinolone, fluocinonide, mometasone, clobetasol, halobetasol, betamethasone, hydrocortisone) can cause thinning and lightening of the skin if they are used for too long in the same area. Your physician has selected the right strength medicine for your problem and area affected on the body. Please use your medication only as directed by your physician to prevent side effects.      Recommend daily broad spectrum sunscreen SPF 30+ to sun-exposed areas, reapply every 2 hours as needed. Call for new or changing lesions.  Staying in the shade or wearing long sleeves, sun glasses (UVA+UVB protection) and wide brim hats (4-inch brim around the entire circumference of the hat) are also recommended for sun protection.      Due to recent changes in healthcare laws, you may see results of your pathology and/or laboratory studies on MyChart before the doctors have had a chance to review them. We understand that in some cases there may be results that are confusing or concerning to you. Please understand that not all results are received at the same time and often the doctors may need to interpret multiple results in order to provide you with the best plan of care or course of treatment. Therefore, we ask that you  please give [place] 2 business days to thoroughly review all your results before contacting the office for clarification. Should we see a critical lab result, you will be contacted sooner.   If You Need Anything After Your Visit  If you have any questions or concerns for your doctor, please call our main line at [phone] and press option 4 to reach your doctor's medical assistant. If no one answers, please leave a voicemail as directed and we will return your call as soon as possible. Messages left after 4 pm will be answered the following business day.   You may also send [place] a message via MyChart. We typically respond to MyChart messages within 1-2 business days.  For prescription refills, please ask your pharmacy to contact our office. Our fax number is [phone].  If you have an urgent issue when the clinic is closed that cannot wait until the next business day, you can page your doctor at the number below.    Please note that while we do our best to be available for urgent issues outside of office hours, we are not available 24/7.   If you have an urgent issue and are unable to reach [place], you may choose to seek medical care at your doctor's office, retail clinic, urgent care center, or emergency room.  If you have a medical emergency, please immediately call 911 or go to the emergency department.  Pager Numbers  - Dr. Gwen Pounds: [phone]  - Dr. Roseanne Reno: [phone]  - Dr. Katrinka Blazing: [phone]   In the event of inclement weather, please  call our main line at [phone] for an update on the status of any delays or closures.  Dermatology Medication Tips: Please keep the boxes that topical medications come in in order to help keep track of the instructions about where and how to use these. Pharmacies typically print the medication instructions only on the boxes and not directly on the medication tubes.   If your medication is too expensive, please contact our office at  [phone] option 4 or send [place] a message through MyChart.   We are unable to tell what your co-pay for medications will be in advance as this is different depending on your insurance coverage. However, we may be able to find a substitute medication at lower cost or fill out paperwork to get insurance to cover a needed medication.   If a prior authorization is required to get your medication covered by your insurance company, please allow [place] 1-2 business days to complete this process.  Drug prices often vary depending on where the prescription is filled and some pharmacies may offer cheaper prices.  The website www.goodrx.com contains coupons for medications through different pharmacies. The prices here do not account for what the cost may be with help from insurance (it may be cheaper with your insurance), but the website can give you the price if you did not use any insurance.  - You can print the associated coupon and take it with your prescription to the pharmacy.  - You may also stop by our office during regular business hours and pick up a GoodRx coupon card.  - If you need your prescription sent electronically to a different pharmacy, notify our office through [facility] or by phone at [phone] option 4.     Si Usted Necesita Algo Despus de Su Visita  Tambin puede enviarnos un mensaje a travs de Clinical cytogeneticist. Por lo general respondemos a los mensajes de MyChart en el transcurso de 1 a 2 das hbiles.  Para renovar recetas, por favor pida a su farmacia que se ponga en contacto con nuestra oficina. Annie Sable de fax es [place] [phone].  Si tiene un asunto urgente cuando la clnica est cerrada y que no puede esperar hasta el siguiente da hbil, puede llamar/localizar a su doctor(a) al nmero que aparece a continuacin.   Por favor, tenga en cuenta que aunque hacemos todo lo posible para estar disponibles para asuntos urgentes fuera del horario de [place], no estamos  disponibles las 24 horas del da, los 7 [address]  [address] de la [place].   Si tiene un problema urgente y no puede comunicarse con nosotros, puede optar por buscar atencin mdica  en el consultorio de su doctor(a), en una clnica privada, en un centro de atencin urgente o en una sala de emergencias.  Si tiene Engineer, drilling, por favor llame inmediatamente al 911 o vaya a la sala de emergencias.  Nmeros de bper  - Dr. Gwen Pounds: [phone]  - Dra. Roseanne Reno: [phone]  - Dr. Katrinka Blazing: [phone]   En caso de inclemencias del tiempo, por favor llame a Lacy Duverney principal al [phone] para una actualizacin sobre el [place] de cualquier retraso o cierre.  Consejos para la medicacin en dermatologa: Por favor, guarde las cajas en las que vienen los medicamentos de uso tpico para ayudarle a seguir las instrucciones sobre dnde y cmo usarlos. Las farmacias generalmente imprimen las instrucciones del medicamento slo en las cajas y no directamente en los tubos del [place].   Si su medicamento es [employer],  por favor, pngase en contacto con nuestra oficina llamando al [phone] y presione la opcin 4 o envenos un mensaje a travs de Clinical cytogeneticist.   No podemos decirle cul ser su copago por los medicamentos por adelantado ya que esto es diferente dependiendo de la cobertura de su seguro. Sin embargo, es posible que podamos encontrar un medicamento sustituto a Audiological scientist un formulario para que el seguro cubra el medicamento que se considera necesario.   Si se requiere una autorizacin previa para que su compaa de seguros [place] su medicamento, por favor permtanos de 1 a 2 das hbiles para completar este proceso.  Los precios de los medicamentos varan con frecuencia dependiendo del Environmental consultant de dnde se surte la receta y alguna farmacias pueden ofrecer precios ms baratos.  El sitio web www.goodrx.com tiene cupones para medicamentos de Health and safety inspector. Los precios aqu no  tienen en cuenta lo que podra costar con la ayuda del seguro (puede ser ms barato con su seguro), pero el sitio web puede darle el precio si no utiliz Tourist information centre manager.  - Puede imprimir el cupn correspondiente y llevarlo con su receta a la farmacia.  - Tambin puede pasar por nuestra oficina durante el horario de atencin regular y Education officer, museum una tarjeta de cupones de GoodRx.  - Si necesita que su receta se enve electrnicamente a una farmacia diferente, informe a nuestra oficina a travs de MyChart de [place] o por telfono llamando al [phone] y presione la opcin 4.

## 2023-06-03 NOTE — Progress Notes (Signed)
 Follow-Up Visit   Subjective  Gina Middleton is a 88 y.o. female who presents for the following: 7 month follow up. AKs. Face, nose. Tx with LN2 11/18/2022. Has not returned for red light/PDT treatment yet. Has an area of concern on nose.   Seborrheic dermatitis on scalp. Applies fluocinolone oil around 30 minutes prior to washing hair. Uses Clobetasol a few times a week.   The patient has spots, moles and lesions to be evaluated, some may be new or changing and the patient may have concern these could be cancer.    The following portions of the chart were reviewed this encounter and updated as appropriate: medications, allergies, medical history  Review of Systems:  No other skin or systemic complaints except as noted in HPI or Assessment and Plan.  Objective  Well appearing patient in no apparent distress; mood and affect are within normal limits.  A focused examination was performed of the following areas: Face, neck, nose, ears  Relevant physical exam findings are noted in the Assessment and Plan.  R Nasal Dorsum x1, L temple x1, crown scalp x2, R vertex scalp x1 (5) Erythematous thin papules/macules with gritty scale.   Assessment & Plan   ACTINIC DAMAGE. Arms, chest - chronic, secondary to cumulative UV radiation exposure/sun exposure over time - diffuse scaly erythematous macules with underlying dyspigmentation - Recommend daily broad spectrum sunscreen SPF 30+ to sun-exposed areas, reapply every 2 hours as needed.  - Recommend staying in the shade or wearing long sleeves, sun glasses (UVA+UVB protection) and wide brim hats (4-inch brim around the entire circumference of the hat). - Call for new or changing lesions. - Wear hat when outside.   NEVUS VS SEBORRHEIC KERATOSIS - 3 mm tan papule at L nasal tip; no change per patient.  - Benign-appearing - Discussed benign etiology and prognosis. - Observe - Call for any changes - Discussed biopsy vs observe.  Patient prefers to observe for now since not bothersome.   SEBORRHEIC KERATOSIS. Arms, chest - Stuck-on, waxy, tan-brown papules and/or plaques  - Benign-appearing - Discussed benign etiology and prognosis. - Observe - Call for any changes   LENTIGINES Exam: scattered tan macules at arms Due to sun exposure Treatment Plan: Benign-appearing, observe. Recommend daily broad spectrum sunscreen SPF 30+ to sun-exposed areas, reapply every 2 hours as needed.  Call for any changes  SEBORRHEIC DERMATITIS with Pityriasis Amiantacea Exam:  erythema/scale with scattered crusted papules at crown, decrease in matted hairs adherent scale,    Chronic and persistent condition with duration or expected duration over one year. Condition is improving with treatment but not currently at goal.    Seborrheic Dermatitis is a chronic persistent rash characterized by pinkness and scaling most commonly of the mid face but also can occur on the scalp (dandruff), ears; mid chest, mid back and groin.  It tends to be exacerbated by stress and cooler weather.  People who have neurologic disease may experience new onset or exacerbation of existing seborrheic dermatitis.  The condition is not curable but treatable and can be controlled.   Treatment Plan: Continue Ketoconazole 2% shampoo apply three times per week, massage into scalp and leave in for 10 minutes before rinsing out     Continue otc Head and shoulders alternating with Ketoconazole shampoo    Continue Clobetasol solution apply to scalp qd-bid prn, Avoid applying to face, groin, and axilla. Use as directed. Long-term use can cause thinning of the skin.   Continue Fluocinolone scalp  oil apply to scalp at bedtime, occlude with shower cap wash out in the morning. Apply nightly until scalp has resolved, then weekly for maintenance   Topical steroids (such as triamcinolone, fluocinolone, fluocinonide, mometasone, clobetasol, halobetasol, betamethasone,  hydrocortisone) can cause thinning and lightening of the skin if they are used for too long in the same area. Your physician has selected the right strength medicine for your problem and area affected on the body. Please use your medication only as directed by your physician to prevent side effects.     AK (ACTINIC KERATOSIS) (5) R Nasal Dorsum x1, L temple x1, crown scalp x2, R vertex scalp x1 (5) Actinic keratoses are precancerous spots that appear secondary to cumulative UV radiation exposure/sun exposure over time. They are chronic with expected duration over 1 year. A portion of actinic keratoses will progress to squamous cell carcinoma of the skin. It is not possible to reliably predict which spots will progress to skin cancer and so treatment is recommended to prevent development of skin cancer.  Recommend daily broad spectrum sunscreen SPF 30+ to sun-exposed areas, reapply every 2 hours as needed.  Recommend staying in the shade or wearing long sleeves, sun glasses (UVA+UVB protection) and wide brim hats (4-inch brim around the entire circumference of the hat). Call for new or changing lesions. Destruction of lesion - R Nasal Dorsum x1, L temple x1, crown scalp x2, R vertex scalp x1 (5)  Destruction method: cryotherapy   Informed consent: discussed and consent obtained   Lesion destroyed using liquid nitrogen: Yes   Region frozen until ice ball extended beyond lesion: Yes   Outcome: patient tolerated procedure well with no complications   Post-procedure details: wound care instructions given   Additional details:  Prior to procedure, discussed risks of blister formation, small wound, skin dyspigmentation, or rare scar following cryotherapy. Recommend Vaseline ointment to treated areas while healing.    Return in about 6 months (around 12/03/2023) for AK Follow Up, Seborrheic Dermatitis Follow Up.  I, Darcie Easterly, CMA, am acting as scribe for Artemio Larry, MD.   Documentation: I  have reviewed the above documentation for accuracy and completeness, and I agree with the above.  Artemio Larry, MD

## 2023-07-15 ENCOUNTER — Ambulatory Visit (INDEPENDENT_AMBULATORY_CARE_PROVIDER_SITE_OTHER)

## 2023-07-15 VITALS — BP 142/74 | Ht 63.0 in | Wt 137.3 lb

## 2023-07-15 DIAGNOSIS — Z0000 Encounter for general adult medical examination without abnormal findings: Secondary | ICD-10-CM | POA: Diagnosis not present

## 2023-07-15 NOTE — Progress Notes (Signed)
 Subjective:   Gina Middleton is a 88 y.o. female who presents for Medicare Annual (Subsequent) preventive examination.  Visit Complete: In person  Cardiac Risk Factors include: advanced age (>55men, >5 women);hypertension;dyslipidemia     Objective:    Today's Vitals   07/15/23 1018  BP: (!) 142/74  Weight: 137 lb 4.8 oz (62.3 kg)  Height: 5\' 3"  (1.6 m)   Body mass index is 24.32 kg/m.     07/15/2023   10:44 AM 05/09/2022    8:36 AM 04/11/2021    1:55 PM 04/19/2020    7:10 AM 04/10/2020    2:05 PM 03/15/2020    3:32 PM 12/15/2019   10:38 AM  Advanced Directives  Does Patient Have a Medical Advance Directive? Yes Yes Yes Yes Yes Yes Yes  Type of Estate agent of [place];Living will Living will Healthcare Power of [place];Living will Healthcare Power of [place];Living will  Healthcare Power of [place];Living will   Does patient want to make changes to medical advance directive? No - Patient declined No - Patient declined Yes (Inpatient - patient defers changing a medical advance directive and declines information at this time) No - Patient declined     Copy of Healthcare Power of Attorney in Chart? Yes - validated most recent copy scanned in chart (See row information)  Yes - validated most recent copy scanned in chart (See row information) Yes - validated most recent copy scanned in chart (See row information)  Yes - validated most recent copy scanned in chart (See row information)   Would patient like information on creating a medical advance directive?  No - Patient declined         Current Medications (verified) Outpatient Encounter Medications as of 07/15/2023  Medication Sig   acetaminophen  (TYLENOL ) 325 MG tablet Take 325 mg by mouth every 6 (six) hours as needed.   amLODipine  (NORVASC ) 5 MG tablet Take 1 tablet (5 mg total) by mouth daily.   aspirin  EC 81 MG tablet Take 81 mg by mouth daily. Swallow whole.   clobetasol  (TEMOVATE ) 0.05 %  external solution Apply to itchy irritated scalp qd-bid prn   Fluocinolone  Acetonide Scalp 0.01 % OIL Apply to scalp at bedtime cover with a cap, wash out in the morning   ketoconazole  (NIZORAL ) 2 % shampoo apply three times per week, massage into scalp and leave in for 10 minutes before rinsing out   losartan -hydrochlorothiazide  (HYZAAR) 50-12.5 MG tablet Take 1 tablet by mouth daily.   VITAMIN D  PO Take 5,000 Units by mouth daily at 6 (six) AM.   meclizine  (ANTIVERT ) 25 MG tablet Take 1 tablet (25 mg total) by mouth 3 (three) times daily as needed for dizziness. (Patient not taking: Reported on 07/15/2023)   ondansetron  (ZOFRAN -ODT) 4 MG disintegrating tablet Take 1 tablet (4 mg total) by mouth every 8 (eight) hours as needed for nausea or vomiting. (Patient not taking: Reported on 07/15/2023)   No facility-administered encounter medications on file as of 07/15/2023.    Allergies (verified) Other, Hydrocodone -acetaminophen , Lisinopril , Oxycodone -acetaminophen , and Iodinated contrast media   History: Past Medical History:  Diagnosis Date   Actinic keratosis 10/08/2012   Left mid to distal tricep    Arthritis    Basal cell carcinoma 07/21/2018   Left inferior antihelix   Cataract    Complication of anesthesia    History of kidney stones    Hypertension    Mixed hyperlipidemia 05/31/2022   Pneumonia 03/2017   PONV (postoperative nausea  and vomiting) 1980's   n/v with lap choley and n/v after 08-2019 hip surgery   Past Surgical History:  Procedure Laterality Date   APPENDECTOMY     BACK SURGERY  1969   CARPAL TUNNEL RELEASE Right 12/22/2019   Procedure: CARPAL TUNNEL RELEASE;  Surgeon: Arlyne Lame, MD;  Location: [facility];  Service: Orthopedics;  Laterality: Right;   CHOLECYSTECTOMY     CYSTOSCOPY W/ RETROGRADES Left 04/09/2018   Procedure: CYSTOSCOPY WITH RETROGRADE PYELOGRAM;  Surgeon: Geraline Knapp, MD;  Location: [facility];  Service: Urology;  Laterality: Left;   CYSTOSCOPY  WITH STENT PLACEMENT Left 04/09/2018   Procedure: CYSTOSCOPY WITH STENT PLACEMENT-LEFT;  Surgeon: Geraline Knapp, MD;  Location: [facility];  Service: Urology;  Laterality: Left;   CYSTOSCOPY WITH URETEROSCOPY Left 04/09/2018   Procedure: POSSIBLE URETEROSCOPY-LEFT;  Surgeon: Geraline Knapp, MD;  Location: [facility];  Service: Urology;  Laterality: Left;   JOINT REPLACEMENT     SPINE SURGERY     TOTAL HIP ARTHROPLASTY Right 08/25/2019   Procedure: TOTAL HIP ARTHROPLASTY;  Surgeon: Arlyne Lame, MD;  Location: [facility];  Service: Orthopedics;  Laterality: Right;   TOTAL HIP ARTHROPLASTY Left 04/19/2020   Procedure: TOTAL HIP ARTHROPLASTY;  Surgeon: Arlyne Lame, MD;  Location: [facility];  Service: Orthopedics;  Laterality: Left;   Family History  Problem Relation Age of Onset   Hypertension Mother    Alzheimer's disease Mother    Heart attack Father        x's 3   Heart disease Father    Diabetes Sister    Heart attack Sister    Breast cancer Maternal Grandmother    Social History   Socioeconomic History   Marital status: Married    Spouse name: Gita Lamb   Number of children: 0   Years of education: [employer] education level: Master's degree (e.g., MA, MS, MEng, MEd, MSW, MBA)  Occupational History   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   Occupation: Works part-time at [employer] and [employer]   Occupation: [employer]    Employer: [employer]    Comment: Part Time  Tobacco Use   Smoking status: Never   Smokeless tobacco: Never  Vaping Use   Vaping status: Never Used  Substance and Sexual Activity   Alcohol use: Never   Drug use: Never   Sexual activity: Not Currently  Other Topics Concern   Not on file  Social History Narrative   Not on file   Social Drivers of Health   Financial Resource Strain: Low Risk  (07/15/2023)   Overall Financial Resource Strain (CARDIA)    Difficulty of Paying Living Expenses: Not hard at all  Food Insecurity: No  Food Insecurity (07/15/2023)   Hunger Vital Sign    Worried About Running Out of Food in the Last Year: Never true    Ran Out of Food in the Last Year: Never true  Transportation Needs: No Transportation Needs (07/15/2023)   PRAPARE - Administrator, Civil Service (Medical): No    Lack of Transportation (Non-Medical): No  Physical Activity: Insufficiently Active (07/15/2023)   Exercise Vital Sign    Days of Exercise per Week: 4 days    Minutes of Exercise per Session: 30 min  Stress: No Stress Concern Present (07/15/2023)   [employer] of Occupational Health - Occupational Stress Questionnaire    Feeling of Stress : Only a little  Social Connections: Moderately Integrated (07/15/2023)  Social Connection and Isolation Panel [NHANES]    Frequency of Communication with Friends and Family: More than three times a week    Frequency of Social Gatherings with Friends and Family: Three times a week    Attends Religious Services: More than 4 times per year    Active Member of Clubs or Organizations: No    Attends Banker Meetings: Never    Marital Status: Married    Tobacco Counseling Counseling given: Not Answered   Clinical Intake:  Pre-visit preparation completed: Yes  Pain : No/denies pain     BMI - recorded: 24.32 Nutritional Status: BMI of 19-24  Normal Nutritional Risks: None Diabetes: No  How often do you need to have someone help you when you read instructions, pamphlets, or other written materials from your doctor or pharmacy?: 1 - Never  Interpreter Needed?: No  Information entered by :: Dellie Fergusson, LPN   Activities of Daily Living    07/15/2023   10:45 AM 05/25/2023    5:51 PM  In your present state of health, do you have any difficulty performing the following activities:  Hearing? 0 1  Vision? 0 1  Difficulty concentrating or making decisions? 0 0  Walking or climbing stairs? 0 0  Dressing or bathing? 0 0  Doing errands,  shopping? 0 0  Preparing Food and eating ? N N  Using the Toilet? N N  In the past six months, have you accidently leaked urine? N N  Do you have problems with loss of bowel control? N N  Managing your Medications? N N  Managing your Finances? N N  Housekeeping or managing your Housekeeping? N N    Patient Care Team: Carlean Charter, DO as PCP - General (Family Medicine) Elta Halter, MD (Dermatology) Geraline Knapp, MD (Urology) Rosa College, MD as Consulting Physician (Ophthalmology) Aubry Blase, Robbie Chiles, MD (Orthopedic Surgery)  Indicate any recent Medical Services you may have received from other than [facility] providers in the past year (date may be approximate).     Assessment:   This is a routine wellness examination for Gina Middleton.  Hearing/Vision screen Hearing Screening - Comments:: NO AIDS Vision Screening - Comments:: WEARS GLASSES ALL DAY-DR.KING   Goals Addressed             This Visit's Progress    Cut out extra servings        Depression Screen    07/15/2023   10:42 AM 12/02/2022    1:10 PM 05/31/2022    2:42 PM 05/21/2022    2:06 PM 11/05/2021    1:21 PM 04/11/2021    1:53 PM 04/11/2020    1:23 PM  PHQ 2/9 Scores  PHQ - 2 Score 0 0 0 0 0 0 0  PHQ- 9 Score 0  0  0  0    Fall Risk    07/15/2023   10:45 AM 05/25/2023    5:51 PM 12/02/2022    1:10 PM 05/31/2022    2:42 PM 05/19/2022    4:57 PM  Fall Risk   Falls in the past year? 0 0 0 0 0  Number falls in past yr: 0      Injury with Fall? 0 0 0    Risk for fall due to : No Fall Risks  No Fall Risks    Follow up Falls evaluation completed  Falls evaluation completed      MEDICARE RISK AT HOME: Medicare Risk at Home  Any stairs in or around the home?: No If so, are there any without handrails?: No Home free of loose throw rugs in walkways, pet beds, electrical cords, etc?: Yes Adequate lighting in your home to reduce risk of falls?: Yes Life alert?: No Use of a cane, walker or w/c?: Yes (CANE  WHEN OUTSIDE ON UNEVEN GROUND) Grab bars in the bathroom?: Yes Shower chair or bench in shower?: Yes Elevated toilet seat or a handicapped toilet?: Yes  TIMED UP AND GO:  Was the test performed?  Yes  Length of time to ambulate 10 feet: 4 sec Gait steady and fast without use of assistive device    Cognitive Function:        07/15/2023   10:48 AM 05/21/2022    2:11 PM 02/24/2018    1:44 PM  6CIT Screen  What Year? 0 points 0 points 0 points  What month? 0 points 0 points 0 points  What time? 0 points 0 points 0 points  Count back from 20 0 points 0 points 0 points  Months in reverse 0 points 0 points 0 points  Repeat phrase 0 points 0 points 0 points  Total Score 0 points 0 points 0 points    Immunizations Immunization History  Administered Date(s) Administered   PFIZER(Purple Top)SARS-COV-2 Vaccination 11/18/2019, 12/09/2019   Pneumococcal Conjugate-13 08/28/2016   Pneumococcal Polysaccharide-23 09/12/2017   Td 05/10/1995    TDAP status: Due, Education has been provided regarding the importance of this vaccine. Advised may receive this vaccine at local pharmacy or Health Dept. Aware to provide a copy of the vaccination record if obtained from local pharmacy or Health Dept. Verbalized acceptance and understanding.  Flu Vaccine status: Up to date  Pneumococcal vaccine status: Declined,  Education has been provided regarding the importance of this vaccine but patient still declined. Advised may receive this vaccine at local pharmacy or Health Dept. Aware to provide a copy of the vaccination record if obtained from local pharmacy or Health Dept. Verbalized acceptance and understanding.   Covid-19 vaccine status: Declined, Education has been provided regarding the importance of this vaccine but patient still declined. Advised may receive this vaccine at local pharmacy or Health Dept.or vaccine clinic. Aware to provide a copy of the vaccination record if obtained from local pharmacy  or Health Dept. Verbalized acceptance and understanding.  Qualifies for Shingles Vaccine? Yes   Zostavax completed No   Shingrix Completed?: No.    Education has been provided regarding the importance of this vaccine. Patient has been advised to call insurance company to determine out of pocket expense if they have not yet received this vaccine. Advised may also receive vaccine at local pharmacy or Health Dept. Verbalized acceptance and understanding.  Screening Tests Health Maintenance  Topic Date Due   Zoster Vaccines- Shingrix (1 of 2) Never done   DTaP/Tdap/Td (2 - Tdap) 05/09/2005   COVID-19 Vaccine (3 - Pfizer risk series) 01/06/2020   INFLUENZA VACCINE  09/19/2023   Medicare Annual Wellness (AWV)  07/14/2024   DEXA SCAN  06/07/2026   Pneumonia Vaccine 69+ Years old  Completed   HPV VACCINES  Aged Out   Meningococcal B Vaccine  Aged Out    Health Maintenance  Health Maintenance Due  Topic Date Due   Zoster Vaccines- Shingrix (1 of 2) Never done   DTaP/Tdap/Td (2 - Tdap) 05/09/2005   COVID-19 Vaccine (3 - Pfizer risk series) 01/06/2020    Colorectal cancer screening: No longer required.   Mammogram  status: No longer required due to AGE.  AGED OUT FOR BONE DENSITY SCAN  Lung Cancer Screening: (Low Dose CT Chest recommended if Age 23-80 years, 20 pack-year currently smoking OR have quit w/in 15years.) does not qualify.    Additional Screening:  Hepatitis C Screening: does not qualify  Vision Screening: Recommended annual ophthalmology exams for early detection of glaucoma and other disorders of the eye. Is the patient up to date with their annual eye exam?  Yes  Who is the provider or what is the name of the office in which the patient attends annual eye exams? DR.Alene Husk If pt is not established with a provider, would they like to be referred to a provider to establish care? No .   Dental Screening: Recommended annual dental exams for proper oral hygiene   Community  Resource Referral / Chronic Care Management: CRR required this visit?  No   CCM required this visit?  No     Plan:     I have personally reviewed and noted the following in the patient's chart:   Medical and social history Use of alcohol, tobacco or illicit drugs  Current medications and supplements including opioid prescriptions. Patient is not currently taking opioid prescriptions. Functional ability and status Nutritional status Physical activity Advanced directives List of other physicians Hospitalizations, surgeries, and ER visits in previous 12 months Vitals Screenings to include cognitive, depression, and falls Referrals and appointments  In addition, I have reviewed and discussed with patient certain preventive protocols, quality metrics, and best practice recommendations. A written personalized care plan for preventive services as well as general preventive health recommendations were provided to patient.     Pinky Bright, LPN   06/01/2438   After Visit Summary: (In Person-Declined) Patient declined AVS at this time.  Nurse Notes: NONE

## 2023-07-15 NOTE — Patient Instructions (Addendum)
 Ms. Mims , Thank you for taking time out of your busy schedule to complete your Annual Wellness Visit with me. I enjoyed our conversation and look forward to speaking with you again next year. I, as well as your care team,  appreciate your ongoing commitment to your health goals. Please review the following plan we discussed and let me know if I can assist you in the future.  Follow up Visits: Next Medicare AWV with our clinical staff:   07/21/24 @ 10:10 AM IN PERSON Have you seen your provider in the last 6 months (3 months if uncontrolled diabetes)? Yes   Clinician Recommendations:  Aim for 30 minutes of exercise or brisk walking, 6-8 glasses of water, and 5 servings of fruits and vegetables each day. TAKE CARE!!      This is a list of the screening recommended for you and due dates:  Health Maintenance  Topic Date Due   Zoster (Shingles) Vaccine (1 of 2) Never done   DTaP/Tdap/Td vaccine (2 - Tdap) 05/09/2005   COVID-19 Vaccine (3 - Pfizer risk series) 01/06/2020   Flu Shot  09/19/2023   Medicare Annual Wellness Visit  07/14/2024   DEXA scan (bone density measurement)  06/07/2026   Pneumonia Vaccine  Completed   HPV Vaccine  Aged Out   Meningitis B Vaccine  Aged Out    Advanced directives: (In Chart) A copy of your advanced directives are scanned into your chart should your provider ever need it. Advance Care Planning is important because it:  [x]  Makes sure you receive the medical care that is consistent with your values, goals, and preferences  [x]  It provides guidance to your family and loved ones and reduces their decisional burden about whether or not they are making the right decisions based on your wishes.  Follow the link provided in your after visit summary or read over the paperwork we have mailed to you to help you started getting your Advance Directives in place. If you need assistance in completing these, please reach out to us  so that we can help you!

## 2023-08-19 ENCOUNTER — Encounter: Payer: Self-pay | Admitting: Family Medicine

## 2023-08-19 ENCOUNTER — Ambulatory Visit: Admitting: Family Medicine

## 2023-08-19 VITALS — BP 129/60 | HR 65 | Ht 63.0 in | Wt 137.2 lb

## 2023-08-19 DIAGNOSIS — E538 Deficiency of other specified B group vitamins: Secondary | ICD-10-CM | POA: Diagnosis not present

## 2023-08-19 DIAGNOSIS — E782 Mixed hyperlipidemia: Secondary | ICD-10-CM

## 2023-08-19 DIAGNOSIS — D509 Iron deficiency anemia, unspecified: Secondary | ICD-10-CM

## 2023-08-19 DIAGNOSIS — E559 Vitamin D deficiency, unspecified: Secondary | ICD-10-CM

## 2023-08-19 DIAGNOSIS — Z7189 Other specified counseling: Secondary | ICD-10-CM | POA: Insufficient documentation

## 2023-08-19 DIAGNOSIS — Z87442 Personal history of urinary calculi: Secondary | ICD-10-CM

## 2023-08-19 DIAGNOSIS — I10 Essential (primary) hypertension: Secondary | ICD-10-CM | POA: Diagnosis not present

## 2023-08-19 DIAGNOSIS — R7303 Prediabetes: Secondary | ICD-10-CM

## 2023-08-19 DIAGNOSIS — N1831 Chronic kidney disease, stage 3a: Secondary | ICD-10-CM

## 2023-08-19 DIAGNOSIS — Z1231 Encounter for screening mammogram for malignant neoplasm of breast: Secondary | ICD-10-CM

## 2023-08-19 NOTE — Progress Notes (Signed)
 Established patient visit   Patient: Gina Middleton   DOB: 06-27-1935   88 y.o. Female  MRN: [id number] Visit Date: 08/19/2023  Today's healthcare provider: LAURAINE LOISE BUOY, DO   Chief Complaint  Patient presents with   Follow-up    Decline vaccines   Subjective    HPI Gina Middleton is an 88 year old female who presents for a routine follow-up visit.  She has a history of hypertension, B12 deficiency, vitamin D  deficiency, and iron deficiency anemia. Her blood pressure is managed with amlodipine  and losartan -hydrochlorothiazide , and she follows up every six months for management. She takes B12 tablets occasionally and follows a vegetarian diet rich in spinach and dark leafy greens. Her last iron level was stable, and she experiences no fatigue or changes in activity levels.  She has a history of kidney stones and underwent surgery for this condition. She has not experienced any recent kidney stones and has increased her water intake, primarily drinking bottled water, with minimal consumption of coffee and Diet Coke.  She experienced a single episode of dizziness and nausea in March 2024, which led to an ER visit where no cause was found. She was prescribed Zofran  for nausea and meclizine  for dizziness, which she has not needed since the episode.  She has had two hip replacements, one in July 2021 and the other in March 2022, and follows up with orthopedics as needed. She remains active and independent, enjoying outdoor activities like using her tractor.  She is currently a caretaker for someone else and remains very independent, including driving herself. No chest pain, shortness of breath, fevers, chills, nausea, vomiting, abdominal pain, diarrhea, or constipation.      Medications: Outpatient Medications Prior to Visit  Medication Sig   acetaminophen  (TYLENOL ) 325 MG tablet Take 325 mg by mouth every 6 (six) hours as needed.   amLODipine  (NORVASC ) 5 MG tablet  Take 1 tablet (5 mg total) by mouth daily.   aspirin  EC 81 MG tablet Take 81 mg by mouth daily. Swallow whole.   clobetasol  (TEMOVATE ) 0.05 % external solution Apply to itchy irritated scalp qd-bid prn   Fluocinolone  Acetonide Scalp 0.01 % OIL Apply to scalp at bedtime cover with a cap, wash out in the morning   ketoconazole  (NIZORAL ) 2 % shampoo apply three times per week, massage into scalp and leave in for 10 minutes before rinsing out   losartan -hydrochlorothiazide  (HYZAAR) 50-12.5 MG tablet Take 1 tablet by mouth daily.   meclizine  (ANTIVERT ) 25 MG tablet Take 1 tablet (25 mg total) by mouth 3 (three) times daily as needed for dizziness.   ondansetron  (ZOFRAN -ODT) 4 MG disintegrating tablet Take 1 tablet (4 mg total) by mouth every 8 (eight) hours as needed for nausea or vomiting.   VITAMIN D  PO Take 5,000 Units by mouth daily at 6 (six) AM.   No facility-administered medications prior to visit.        Objective    BP 129/60 (BP Location: Left Arm, Patient Position: Sitting, Cuff Size: Normal)   Pulse 65   Ht 5' 3 (1.6 m)   Wt 137 lb 3.2 oz (62.2 kg)   SpO2 99%   BMI 24.30 kg/m     Physical Exam Constitutional:      Appearance: Normal appearance.  HENT:     Head: Normocephalic and atraumatic.   Eyes:     General: No scleral icterus.    Extraocular Movements: Extraocular movements intact.     Conjunctiva/sclera:  Conjunctivae normal.    Cardiovascular:     Rate and Rhythm: Normal rate and regular rhythm.     Pulses: Normal pulses.     Heart sounds: Normal heart sounds.  Pulmonary:     Effort: Pulmonary effort is normal. No respiratory distress.     Breath sounds: Normal breath sounds.  Abdominal:     General: Bowel sounds are normal. There is no distension.     Palpations: Abdomen is soft. There is no mass.     Tenderness: There is no abdominal tenderness. There is no guarding.   Musculoskeletal:     Right lower leg: No edema.     Left lower leg: No edema.    Skin:    General: Skin is warm and dry.   Neurological:     Mental Status: She is alert and oriented to person, place, and time. Mental status is at baseline.   Psychiatric:        Mood and Affect: Mood normal.        Behavior: Behavior normal.      No results found for any visits on 08/19/23.  Assessment & Plan    Primary hypertension  Iron deficiency anemia, unspecified iron deficiency anemia type  B12 deficiency -     Vitamin B12  Avitaminosis D -     VITAMIN D  25 Hydroxy (Vit-D Deficiency, Fractures)  Prediabetes -     Hemoglobin A1c  Stage 3a chronic kidney disease (HCC) -     Comprehensive metabolic panel with GFR  Mixed hyperlipidemia -     Lipid panel  Encounter for screening mammogram for breast cancer -     3D Screening Mammogram, Left and Right; Future  History of kidney stones  Goals of care, counseling/discussion       Hypertension Chronic hypertension well-managed with current medications. - Continue current antihypertensive medications. - Monitor blood pressure regularly.  Iron deficiency anemia Iron deficiency anemia managed with diet. Last iron level adequate in October 2024. - Monitor iron levels as needed. - Encourage iron-rich diet.  Vitamin B12 deficiency Vitamin B12 deficiency managed with intermittent B12 tablets. - Continue taking vitamin B12 tablets as needed. - Check vitamin B12 level today.  Vitamin D  deficiency Vitamin D  deficiency managed with supplementation. - Continue vitamin D  supplementation. - Check vitamin D  level today.  Osteopenia Last bone density test in 2023 with left forearm T-score -1.6 (only the forearm was checked due to previous surgeries of hips and advanced degenerative changes in the spine). - Consider repeating bone density test in the coming 3 years. - Continue vitamin D  supplementation. - Consider calcium supplementation.  History of kidney stones History of kidney stones with previous  surgery. No recent stones reported. Follow-up with nephrologist planned for September 2025. - Follow up with nephrologist in September 2025. - Encourage hydration with water.  General Health Maintenance Discussed importance of regular screenings and lifestyle modifications. History of adverse reactions to vaccines. - Order blood work. - Order mammogram.  Goals of Care She has a living will and does not wish to be kept alive mechanically. Prefers not to undergo dialysis if kidney function worsens significantly. Willing to undergo surgery for breast cancer if necessary but uncertain about chemotherapy and radiation. - Respect her living will and advance directives.   Follow-up Regular follow-up is important for ongoing management of chronic conditions. - Schedule follow-up appointment in 6 months. - Complete lab work today. - She to call for mammogram appointment.  Return in about  6 months (around 02/19/2024) for CPE, Chronic f/u.      I discussed the assessment and treatment plan with the patient  The patient was provided an opportunity to ask questions and all were answered. The patient agreed with the plan and demonstrated an understanding of the instructions.   The patient was advised to call back or seek an in-person evaluation if the symptoms worsen or if the condition fails to improve as anticipated.    LAURAINE LOISE BUOY, DO  [facility] [phone] (phone) [phone] (fax)  [facility]

## 2023-08-19 NOTE — Patient Instructions (Signed)
 Please call the [facility] [phone]) to schedule a routine screening mammogram.

## 2023-08-20 LAB — LIPID PANEL
Chol/HDL Ratio: 5.2 ratio — ABNORMAL HIGH (ref 0.0–4.4)
Cholesterol, Total: 160 mg/dL (ref 100–199)
HDL: 31 mg/dL — ABNORMAL LOW (ref >39)
LDL Chol Calc (NIH): 94 mg/dL (ref 0–99)
Triglycerides: 203 mg/dL — ABNORMAL HIGH (ref 0–149)
VLDL Cholesterol Cal: 35 mg/dL (ref 5–40)

## 2023-08-20 LAB — COMPREHENSIVE METABOLIC PANEL WITH GFR
ALT: 13 IU/L (ref 0–32)
AST: 21 IU/L (ref 0–40)
Albumin: 4.5 g/dL (ref 3.7–4.7)
Alkaline Phosphatase: 80 IU/L (ref 44–121)
BUN/Creatinine Ratio: 14 (ref 12–28)
BUN: 20 mg/dL (ref 8–27)
Bilirubin Total: 0.6 mg/dL (ref 0.0–1.2)
CO2: 21 mmol/L (ref 20–29)
Calcium: 9.3 mg/dL (ref 8.7–10.3)
Chloride: 103 mmol/L (ref 96–106)
Creatinine, Ser: 1.39 mg/dL — ABNORMAL HIGH (ref 0.57–1.00)
Globulin, Total: 2.5 g/dL (ref 1.5–4.5)
Glucose: 100 mg/dL — ABNORMAL HIGH (ref 70–99)
Potassium: 4.1 mmol/L (ref 3.5–5.2)
Sodium: 140 mmol/L (ref 134–144)
Total Protein: 7 g/dL (ref 6.0–8.5)
eGFR: 37 mL/min/{1.73_m2} — ABNORMAL LOW (ref >59)

## 2023-08-20 LAB — VITAMIN B12: Vitamin B-12: 543 pg/mL (ref 232–1245)

## 2023-08-20 LAB — HEMOGLOBIN A1C
Est. average glucose Bld gHb Est-mCnc: 120 mg/dL
Hgb A1c MFr Bld: 5.8 % — ABNORMAL HIGH (ref 4.8–5.6)

## 2023-08-20 LAB — VITAMIN D 25 HYDROXY (VIT D DEFICIENCY, FRACTURES): Vit D, 25-Hydroxy: 31.6 ng/mL (ref 30.0–100.0)

## 2023-09-04 ENCOUNTER — Ambulatory Visit: Payer: Self-pay | Admitting: Family Medicine

## 2023-09-26 ENCOUNTER — Ambulatory Visit
Admission: RE | Admit: 2023-09-26 | Discharge: 2023-09-26 | Disposition: A | Discharge status: Home / Self Care | Source: Ambulatory Visit | Attending: Family Medicine | Admitting: Family Medicine

## 2023-09-26 DIAGNOSIS — Z1231 Encounter for screening mammogram for malignant neoplasm of breast: Secondary | ICD-10-CM | POA: Insufficient documentation

## 2023-12-03 DIAGNOSIS — H34812 Central retinal vein occlusion, left eye, with macular edema: Secondary | ICD-10-CM | POA: Diagnosis not present

## 2023-12-09 ENCOUNTER — Ambulatory Visit: Admitting: Dermatology

## 2023-12-09 ENCOUNTER — Encounter: Payer: Self-pay | Admitting: Dermatology

## 2023-12-09 DIAGNOSIS — L219 Seborrheic dermatitis, unspecified: Secondary | ICD-10-CM

## 2023-12-09 DIAGNOSIS — D492 Neoplasm of unspecified behavior of bone, soft tissue, and skin: Secondary | ICD-10-CM

## 2023-12-09 DIAGNOSIS — L570 Actinic keratosis: Secondary | ICD-10-CM | POA: Diagnosis not present

## 2023-12-09 DIAGNOSIS — L305 Pityriasis alba: Secondary | ICD-10-CM | POA: Diagnosis not present

## 2023-12-09 DIAGNOSIS — W908XXA Exposure to other nonionizing radiation, initial encounter: Secondary | ICD-10-CM | POA: Diagnosis not present

## 2023-12-09 DIAGNOSIS — D044 Carcinoma in situ of skin of scalp and neck: Secondary | ICD-10-CM

## 2023-12-09 DIAGNOSIS — L448 Other specified papulosquamous disorders: Secondary | ICD-10-CM

## 2023-12-09 DIAGNOSIS — D099 Carcinoma in situ, unspecified: Secondary | ICD-10-CM

## 2023-12-09 HISTORY — DX: Carcinoma in situ, unspecified: D09.9

## 2023-12-09 NOTE — Patient Instructions (Addendum)
 Wound Care Instructions  Cleanse wound gently with shampoo and water once a day (Starting Thursday) then pat dry with clean gauze. Apply a thin coat of Petrolatum (petroleum jelly, Vaseline) over the wound (unless you have an allergy to this). We recommend that you use a new, sterile tube of Vaseline. Do not pick or remove scabs. Do not remove the yellow or white healing tissue from the base of the wound.  Cover the wound with fresh, clean, nonstick gauze and secure with paper tape. You may use Band-Aids in place of gauze and tape if the wound is small enough, but would recommend trimming much of the tape off as there is often too much. Sometimes Band-Aids can irritate the skin.  You should call the office for your biopsy report after 1 week if you have not already been contacted.  If you experience any problems, such as abnormal amounts of bleeding, swelling, significant bruising, significant pain, or evidence of infection, please call the office immediately.  FOR ADULT SURGERY PATIENTS: If you need something for pain relief you may take 1 extra strength Tylenol  (acetaminophen ) AND 2 Ibuprofen (200mg  each) together every 4 hours as needed for pain. (do not take these if you are allergic to them or if you have a reason you should not take them.) Typically, you may only need pain medication for 1 to 3 days.      Cryotherapy Aftercare  Wash gently with soap and water everyday.   Apply Vaseline Jelly daily until healed.    Do not apply Clobetasol  solution or Fluocinonide oil to areas on scalp that were treated today until they are healed.    Continue Ketoconazole  2% shampoo apply three times per week, massage into scalp and leave in for 10 minutes before rinsing out     Continue otc Head and shoulders alternating with Ketoconazole  shampoo    Continue Clobetasol  solution apply to scalp once or twice daily as needed for worse areas, Avoid applying to face, groin, and axilla. Use as  directed. Long-term use can cause thinning of the skin.    Continue once or twice weekly to scalp for maintenance    Due to recent changes in healthcare laws, you may see results of your pathology and/or laboratory studies on MyChart before the doctors have had a chance to review them. We understand that in some cases there may be results that are confusing or concerning to you. Please understand that not all results are received at the same time and often the doctors may need to interpret multiple results in order to provide you with the best plan of care or course of treatment. Therefore, we ask that you please give us  2 business days to thoroughly review all your results before contacting the office for clarification. Should we see a critical lab result, you will be contacted sooner.   If You Need Anything After Your Visit  If you have any questions or concerns for your doctor, please call our main line at [phone] and press option 4 to reach your doctor's medical assistant. If no one answers, please leave a voicemail as directed and we will return your call as soon as possible. Messages left after 4 pm will be answered the following business day.   You may also send us  a message via MyChart. We typically respond to MyChart messages within 1-2 business days.  For prescription refills, please ask your pharmacy to contact our office. Our fax number is [phone].  If you have  an urgent issue when the clinic is closed that cannot wait until the next business day, you can page your doctor at the number below.    Please note that while we do our best to be available for urgent issues outside of office hours, we are not available 24/7.   If you have an urgent issue and are unable to reach us , you may choose to seek medical care at your doctor's office, retail clinic, urgent care center, or emergency room.  If you have a medical emergency, please immediately call 911 or go to the emergency  department.  Pager Numbers  - Dr. Hester: [phone]  - Dr. Jackquline: [phone]  - Dr. Claudene: [phone]   - Dr. Raymund: [phone]  In the event of inclement weather, please call our main line at [phone] for an update on the status of any delays or closures.  Dermatology Medication Tips: Please keep the boxes that topical medications come in in order to help keep track of the instructions about where and how to use these. Pharmacies typically print the medication instructions only on the boxes and not directly on the medication tubes.   If your medication is too expensive, please contact our office at [phone] option 4 or send us  a message through MyChart.   We are unable to tell what your co-pay for medications will be in advance as this is different depending on your insurance coverage. However, we may be able to find a substitute medication at lower cost or fill out paperwork to get insurance to cover a needed medication.   If a prior authorization is required to get your medication covered by your insurance company, please allow us  1-2 business days to complete this process.  Drug prices often vary depending on where the prescription is filled and some pharmacies may offer cheaper prices.  The website www.goodrx.com contains coupons for medications through different pharmacies. The prices here do not account for what the cost may be with help from insurance (it may be cheaper with your insurance), but the website can give you the price if you did not use any insurance.  - You can print the associated coupon and take it with your prescription to the pharmacy.  - You may also stop by our office during regular business hours and pick up a GoodRx coupon card.  - If you need your prescription sent electronically to a different pharmacy, notify our office through [facility] or by phone at [phone] option 4.     Si Usted Necesita Algo Despus de Su  Visita  Tambin puede enviarnos un mensaje a travs de Clinical cytogeneticist. Por lo general respondemos a los mensajes de MyChart en el transcurso de 1 a 2 das hbiles.  Para renovar recetas, por favor pida a su farmacia que se ponga en contacto con nuestra oficina. [place]i [place] de fax es [place] [phone].  Si tiene un asunto urgente cuando la clnica est cerrada y que no puede esperar hasta el siguiente da hbil, puede llamar/localizar a su doctor(a) al nmero que aparece a continuacin.   Por favor, tenga en cuenta que aunque hacemos todo lo posible para estar disponibles para asuntos urgentes fuera del horario de [place], no estamos disponibles las 24 horas del da, los 7 [address]  [address] de la [place].   Si tiene un problema urgente y no puede comunicarse con nosotros, puede optar por buscar atencin mdica  en el consultorio de su doctor(a), en una clnica privada, en un centro de atencin  urgente o en una sala de emergencias.  Si tiene Engineer, drilling, por favor llame inmediatamente al 911 o vaya a la sala de emergencias.  Nmeros de bper  - Dr. Hester: [phone]  - Dra. Jackquline: [phone]  - Dr. Claudene: [phone]  - Dra. Kitts: [phone]  En caso de inclemencias del [place], por favor llame a nuestra lnea principal al [phone] para una actualizacin sobre el estado de cualquier retraso o cierre.  Consejos para la medicacin en dermatologa: Por favor, guarde las cajas en las que vienen los medicamentos de uso tpico para ayudarle a seguir las instrucciones sobre dnde y cmo usarlos. Las farmacias generalmente imprimen las instrucciones del medicamento slo en las cajas y no directamente en los tubos del [place].   Si su medicamento es muy caro, por favor, pngase en contacto con landry rieger llamando al [phone] y presione la opcin 4 o envenos un mensaje a travs de Clinical cytogeneticist.   No podemos decirle cul ser su copago por los medicamentos por adelantado ya que esto  es diferente dependiendo de la cobertura de su seguro. Sin embargo, es posible que podamos encontrar un medicamento sustituto a Audiological scientist un formulario para que el seguro cubra el medicamento que se considera necesario.   Si se requiere una autorizacin previa para que su compaa de seguros malta su medicamento, por favor permtanos de 1 a 2 das hbiles para completar este proceso.  Los precios de los medicamentos varan con frecuencia dependiendo del Environmental consultant de dnde se surte la receta y alguna farmacias pueden ofrecer precios ms baratos.  El sitio web www.goodrx.com tiene cupones para medicamentos de Health and safety inspector. Los precios aqu no tienen en cuenta lo que podra costar con la ayuda del seguro (puede ser ms barato con su seguro), pero el sitio web puede darle el precio si no utiliz Tourist information centre manager.  - Puede imprimir el cupn correspondiente y llevarlo con su receta a la farmacia.  - Tambin puede pasar por nuestra oficina durante el horario de atencin regular y Education officer, museum una tarjeta de cupones de GoodRx.  - Si necesita que su receta se enve electrnicamente a una farmacia diferente, informe a nuestra oficina a travs de MyChart de [place] o por telfono llamando al [phone] y presione la opcin 4.

## 2023-12-09 NOTE — Progress Notes (Signed)
 Follow-Up Visit   Subjective  Gina Middleton is a 88 y.o. female who presents for the following: 6 month AK follow up. Tx with LN2 at last visit on nose, L temple and scalp. Concerned with lesion on scalp. Will not go away.  6 month seb derm follow up. Using Ketoconazole  shampoo, H&S shampoo, Clobetasol  solution and fluocinolone  oil.    The following portions of the chart were reviewed this encounter and updated as appropriate: medications, allergies, medical history  Review of Systems:  No other skin or systemic complaints except as noted in HPI or Assessment and Plan.  Objective  Well appearing patient in no apparent distress; mood and affect are within normal limits.  A focused examination was performed of the following areas: Scalp, face  Relevant exam findings are noted in the Assessment and Plan.  Posterior crown scalp 1 cm Keratotic papule   Crown scalp x5 (5) Hyperkeratotic scaly papules, one with heme crusting  Assessment & Plan   SEBORRHEIC DERMATITIS with Pityriasis Amiantacea Exam:  erythema/scale with scattered crusted papules at crown, decrease in matted hairs adherent scale   Chronic and persistent condition with duration or expected duration over one year. Condition is improving with treatment but not currently at goal.     Seborrheic Dermatitis is a chronic persistent rash characterized by pinkness and scaling most commonly of the mid face but also can occur on the scalp (dandruff), ears; mid chest, mid back and groin.  It tends to be exacerbated by stress and cooler weather.  People who have neurologic disease may experience new onset or exacerbation of existing seborrheic dermatitis.  The condition is not curable but treatable and can be controlled.   Treatment Plan: Continue Ketoconazole  2% shampoo apply three times per week, massage into scalp and leave in for 10 minutes before rinsing out     Continue otc Head and shoulders alternating with  Ketoconazole  shampoo    Continue Clobetasol  solution apply to scalp qd-bid prn, Avoid applying to face, groin, and axilla. Use as directed. Long-term use can cause thinning of the skin.    Continue Fluocinolone  scalp oil apply to scalp at bedtime, occlude with shower cap wash out in the morning. Apply1-2x weekly for maintenance   Topical steroids (such as triamcinolone, fluocinolone , fluocinonide, mometasone, clobetasol , halobetasol, betamethasone, hydrocortisone) can cause thinning and lightening of the skin if they are used for too long in the same area. Your physician has selected the right strength medicine for your problem and area affected on the body. Please use your medication only as directed by your physician to prevent side effects.    NEOPLASM OF SKIN Posterior crown scalp Skin / nail biopsy Type of biopsy: tangential   Informed consent: discussed and consent obtained   Anesthesia: the lesion was anesthetized in a standard fashion   Anesthesia comment:  Area prepped with alcohol Anesthetic:  1% lidocaine  w/ epinephrine 1-100,000 buffered w/ 8.4% NaHCO3 Instrument used: flexible razor blade   Hemostasis achieved with: pressure, aluminum chloride and electrodesiccation   Outcome: patient tolerated procedure well   Post-procedure details: wound care instructions given   Post-procedure details comment:  Ointment and small bandage applied  Specimen 1 - Surgical pathology Differential Diagnosis: Hypertrophic AK, R/O SCC  Check Margins: No HYPERTROPHIC ACTINIC KERATOSIS (5) Crown scalp x5 (5) Actinic keratoses are precancerous spots that appear secondary to cumulative UV radiation exposure/sun exposure over time. They are chronic with expected duration over 1 year. A portion of actinic keratoses will  progress to squamous cell carcinoma of the skin. It is not possible to reliably predict which spots will progress to skin cancer and so treatment is recommended to prevent development of  skin cancer.  Recommend daily broad spectrum sunscreen SPF 30+ to sun-exposed areas, reapply every 2 hours as needed.  Recommend staying in the shade or wearing long sleeves, sun glasses (UVA+UVB protection) and wide brim hats (4-inch brim around the entire circumference of the hat). Call for new or changing lesions. Destruction of lesion - Crown scalp x5 (5)  Destruction method: cryotherapy   Informed consent: discussed and consent obtained   Lesion destroyed using liquid nitrogen: Yes   Region frozen until ice ball extended beyond lesion: Yes   Outcome: patient tolerated procedure well with no complications   Post-procedure details: wound care instructions given   Additional details:  Prior to procedure, discussed risks of blister formation, small wound, skin dyspigmentation, or rare scar following cryotherapy. Recommend Vaseline ointment to treated areas while healing.    Return in about 3 months (around 03/10/2024) for AK Follow Up, Biopsy Follow Up.  I, Kate Fought, CMA, am acting as scribe for Rexene Rattler, MD.   Documentation: I have reviewed the above documentation for accuracy and completeness, and I agree with the above.  Rexene Rattler, MD

## 2023-12-12 LAB — SURGICAL PATHOLOGY

## 2023-12-15 ENCOUNTER — Ambulatory Visit: Payer: Self-pay | Admitting: Dermatology

## 2023-12-15 ENCOUNTER — Encounter: Payer: Self-pay | Admitting: Dermatology

## 2023-12-15 NOTE — Telephone Encounter (Signed)
-----   Message from Rexene Rattler sent at 12/15/2023 11:20 AM EDT ----- 1. Skin, posterior crown scalp :       SQUAMOUS CELL CARCINOMA IN SITU, BASE INVOLVED   SCCIS skin cancer, needs EDC, and will likely treat this area and rest of scalp with topical 5FU/VitD. - please call patient ----- Message ----- From: Interface, Lab In [facility] Sent: 12/12/2023   5:27 PM EDT To: Rexene Rattler, MD

## 2023-12-15 NOTE — Telephone Encounter (Signed)
 Advised pt of bx results.  Scheduled pt for [facility] 01/07/24 at 11:00am./sh

## 2024-01-01 ENCOUNTER — Encounter: Payer: Self-pay | Admitting: Dermatology

## 2024-01-01 ENCOUNTER — Ambulatory Visit: Admitting: Dermatology

## 2024-01-01 ENCOUNTER — Other Ambulatory Visit: Payer: Self-pay

## 2024-01-01 ENCOUNTER — Telehealth: Payer: Self-pay | Admitting: Family Medicine

## 2024-01-01 DIAGNOSIS — D099 Carcinoma in situ, unspecified: Secondary | ICD-10-CM

## 2024-01-01 DIAGNOSIS — Z79899 Other long term (current) drug therapy: Secondary | ICD-10-CM | POA: Diagnosis not present

## 2024-01-01 DIAGNOSIS — L305 Pityriasis alba: Secondary | ICD-10-CM | POA: Diagnosis not present

## 2024-01-01 DIAGNOSIS — D044 Carcinoma in situ of skin of scalp and neck: Secondary | ICD-10-CM | POA: Diagnosis not present

## 2024-01-01 DIAGNOSIS — L448 Other specified papulosquamous disorders: Secondary | ICD-10-CM

## 2024-01-01 DIAGNOSIS — L219 Seborrheic dermatitis, unspecified: Secondary | ICD-10-CM

## 2024-01-01 DIAGNOSIS — Z7189 Other specified counseling: Secondary | ICD-10-CM

## 2024-01-01 DIAGNOSIS — I10 Essential (primary) hypertension: Secondary | ICD-10-CM

## 2024-01-01 MED ORDER — LOSARTAN POTASSIUM-HCTZ 50-12.5 MG PO TABS: 1.0000 | ORAL_TABLET | Freq: Every day | ORAL | 3 refills | Status: AC

## 2024-01-01 MED ORDER — FLUOCINOLONE ACETONIDE SCALP 0.01 % EX OIL: TOPICAL_OIL | CUTANEOUS | 5 refills | Status: AC

## 2024-01-01 NOTE — Progress Notes (Signed)
 Follow-Up Visit   Subjective  Gina Middleton is a 88 y.o. female who presents for the following: SCC in situ, biopsy proven, of the posterior crown scalp. EDC today. Discuss treatment of rest of scalp with 5FU/VitD.   The patient has spots, moles and lesions to be evaluated, some may be new or changing.   The following portions of the chart were reviewed this encounter and updated as appropriate: medications, allergies, medical history  Review of Systems:  No other skin or systemic complaints except as noted in HPI or Assessment and Plan.  Objective  Well appearing patient in no apparent distress; mood and affect are within normal limits.  A focused examination was performed of the following areas: scalp Relevant physical exam findings are noted in the Assessment and Plan.  posterior crown scalp Pink crusted biopsy site.  Assessment & Plan  SEBORRHEIC DERMATITIS with Pityriasis Amiantacea Exam:  erythema/scale with scattered crusted papules at crown with matted hairs adherent scale   Chronic and persistent condition with duration or expected duration over one year. Condition is improving with treatment but not currently at goal.     Seborrheic Dermatitis is a chronic persistent rash characterized by pinkness and scaling most commonly of the mid face but also can occur on the scalp (dandruff), ears; mid chest, mid back and groin.  It tends to be exacerbated by stress and cooler weather.  People who have neurologic disease may experience new onset or exacerbation of existing seborrheic dermatitis.  The condition is not curable but treatable and can be controlled.   Treatment Plan: Continue Ketoconazole  2% shampoo apply three times per week, massage into scalp and leave in for 10 minutes before rinsing out     Continue otc Head and shoulders alternating with Ketoconazole  shampoo    Continue Clobetasol  solution apply to scalp qd-bid prn, Avoid applying to face, groin, and  axilla. Use as directed. Long-term use can cause thinning of the skin.    Continue Fluocinolone  scalp oil apply to scalp at bedtime, occlude with shower cap wash out in the morning x 1-2 weeks. Apply1-2x weekly for maintenance  F/up in 2 weeks, and at that time will start 5FU/VitD to scalp if indicated   Topical steroids (such as triamcinolone, fluocinolone , fluocinonide, mometasone, clobetasol , halobetasol, betamethasone, hydrocortisone) can cause thinning and lightening of the skin if they are used for too long in the same area. Your physician has selected the right strength medicine for your problem and area affected on the body. Please use your medication only as directed by your physician to prevent side effects.    Long term medication management.  Patient is using long term (months to years) prescription medication  to control their dermatologic condition.  These medications require periodic monitoring to evaluate for efficacy and side effects and may require periodic laboratory monitoring.  SQUAMOUS CELL CARCINOMA IN SITU posterior crown scalp Destruction of lesion  Destruction method: electrodesiccation and curettage   Informed consent: discussed and consent obtained   Timeout:  patient name, date of birth, surgical site, and procedure verified Anesthesia: the lesion was anesthetized in a standard fashion   Anesthetic:  1% lidocaine  w/ epinephrine 1-100,000 local infiltration Curettage performed in three different directions: Yes   Electrodesiccation performed over the curetted area: Yes   Final wound size (cm):  1.1 Hemostasis achieved with:  pressure, aluminum chloride and electrodesiccation Outcome: patient tolerated procedure well with no complications   Post-procedure details: wound care instructions given   Post-procedure  details comment:  Ointment and bandage applied.    Return in about 2 weeks (around 01/15/2024) for recheck scalp.  IAndrea Kerns, CMA, am acting as  scribe for Rexene Rattler, MD .   Documentation: I have reviewed the above documentation for accuracy and completeness, and I agree with the above.  Rexene Rattler, MD

## 2024-01-01 NOTE — Patient Instructions (Addendum)
 Continue Fluocinolone  scalp oil apply to scalp at bedtime, occlude with shower cap wash out in the morning with Ketoconazole  2% shampoo. Leave shampoo on at least 5 mins before rinsing. Treat nightly until scabs/scale improved (at least a week). Apply 1-2x weekly for maintenance.   In morning, apply clobetasol  solution to itchy spots in scalp - spot treat.      Electrodesiccation and Curettage ("Scrape and Burn") Wound Care Instructions  Leave the original bandage on for 24 hours if possible.  If the bandage becomes soaked or soiled before that time, it is OK to remove it and examine the wound.  A small amount of post-operative bleeding is normal.  If excessive bleeding occurs, remove the bandage, place gauze over the site and apply continuous pressure (no peeking) over the area for 30 minutes. If this does not work, please call our clinic as soon as possible or page your doctor if it is after hours.   Once a day, cleanse the wound with soap and water. It is fine to shower. If a thick crust develops you may use a Q-tip dipped into dilute hydrogen peroxide (mix 1:1 with water) to dissolve it.  Hydrogen peroxide can slow the healing process, so use it only as needed.    After washing, apply petroleum jelly (Vaseline) or an antibiotic ointment if your doctor prescribed one for you, followed by a bandage.    For best healing, the wound should be covered with a layer of ointment at all times. If you are not able to keep the area covered with a bandage to hold the ointment in place, this may mean re-applying the ointment several times a day.  Continue this wound care until the wound has healed and is no longer open. It may take several weeks for the wound to heal and close.  Itching and mild discomfort is normal during the healing process.  If you have any discomfort, you can take Tylenol  (acetaminophen ) or ibuprofen as directed on the bottle. (Please do not take these if you have an allergy to them or  cannot take them for another reason).  Some redness, tenderness and white or yellow material in the wound is normal healing.  If the area becomes very sore and red, or develops a thick yellow-green material (pus), it may be infected; please notify us .    Wound healing continues for up to one year following surgery. It is not unusual to experience pain in the scar from time to time during the interval.  If the pain becomes severe or the scar thickens, you should notify the office.    A slight amount of redness in a scar is expected for the first six months.  After six months, the redness will fade and the scar will soften and fade.  The color difference becomes less noticeable with time.  If there are any problems, return for a post-op surgery check at your earliest convenience.  To improve the appearance of the scar, you can use silicone scar gel, cream, or sheets (such as Mederma or Serica) every night for up to one year. These are available over the counter (without a prescription).  Please call our office at [phone] for any questions or concerns.  Due to recent changes in healthcare laws, you may see results of your pathology and/or laboratory studies on MyChart before the doctors have had a chance to review them. We understand that in some cases there may be results that are confusing or concerning to  you. Please understand that not all results are received at the same time and often the doctors may need to interpret multiple results in order to provide you with the best plan of care or course of treatment. Therefore, we ask that you please give us  2 business days to thoroughly review all your results before contacting the office for clarification. Should we see a critical lab result, you will be contacted sooner.   If You Need Anything After Your Visit  If you have any questions or concerns for your doctor, please call our main line at [phone] and press option 4 to reach your  doctor's medical assistant. If no one answers, please leave a voicemail as directed and we will return your call as soon as possible. Messages left after 4 pm will be answered the following business day.   You may also send us  a message via MyChart. We typically respond to MyChart messages within 1-2 business days.  For prescription refills, please ask your pharmacy to contact our office. Our fax number is [phone].  If you have an urgent issue when the clinic is closed that cannot wait until the next business day, you can page your doctor at the number below.    Please note that while we do our best to be available for urgent issues outside of office hours, we are not available 24/7.   If you have an urgent issue and are unable to reach us , you may choose to seek medical care at your doctor's office, retail clinic, urgent care center, or emergency room.  If you have a medical emergency, please immediately call 911 or go to the emergency department.  Pager Numbers  - Dr. Hester: [phone]  - Dr. Jackquline: [phone]  - Dr. Claudene: [phone]   - Dr. Raymund: [phone]  In the event of inclement weather, please call our main line at [phone] for an update on the status of any delays or closures.  Dermatology Medication Tips: Please keep the boxes that topical medications come in in order to help keep track of the instructions about where and how to use these. Pharmacies typically print the medication instructions only on the boxes and not directly on the medication tubes.   If your medication is too expensive, please contact our office at [phone] option 4 or send us  a message through MyChart.   We are unable to tell what your co-pay for medications will be in advance as this is different depending on your insurance coverage. However, we may be able to find a substitute medication at lower cost or fill out paperwork to get insurance to cover a needed medication.    If a prior authorization is required to get your medication covered by your insurance company, please allow us  1-2 business days to complete this process.  Drug prices often vary depending on where the prescription is filled and some pharmacies may offer cheaper prices.  The website www.goodrx.com contains coupons for medications through different pharmacies. The prices here do not account for what the cost may be with help from insurance (it may be cheaper with your insurance), but the website can give you the price if you did not use any insurance.  - You can print the associated coupon and take it with your prescription to the pharmacy.  - You may also stop by our office during regular business hours and pick up a GoodRx coupon card.  - If you need your prescription sent electronically to a different pharmacy,  notify our office through [facility] or by phone at [phone] option 4.     Si Usted Necesita Algo Despus de Su Visita  Tambin puede enviarnos un mensaje a travs de Clinical Cytogeneticist. Por lo general respondemos a los mensajes de MyChart en el transcurso de 1 a 2 das hbiles.  Para renovar recetas, por favor pida a su farmacia que se ponga en contacto con nuestra oficina. Randi lakes de fax es [place] [phone].  Si tiene un asunto urgente cuando la clnica est cerrada y que no puede esperar hasta el siguiente da hbil, puede llamar/localizar a su doctor(a) al nmero que aparece a continuacin.   Por favor, tenga en cuenta que aunque hacemos todo lo posible para estar disponibles para asuntos urgentes fuera del horario de [place], no estamos disponibles las 24 horas del da, los 7 [address]  [address] de la [place].   Si tiene un problema urgente y no puede comunicarse con nosotros, puede optar por buscar atencin mdica  en el consultorio de su doctor(a), en una clnica privada, en un centro de atencin urgente o en una sala de emergencias.  Si tiene engineer, drilling, por favor  llame inmediatamente al 911 o vaya a la sala de emergencias.  Nmeros de bper  - Dr. Hester: [phone]  - Dra. Jackquline: [phone]  - Dr. Claudene: [phone]  - Dra. Kitts: [phone]  En caso de inclemencias del [place], por favor llame a nuestra lnea principal al [phone] para una actualizacin sobre el estado de cualquier retraso o cierre.  Consejos para la medicacin en dermatologa: Por favor, guarde las cajas en las que vienen los medicamentos de uso tpico para ayudarle a seguir las instrucciones sobre dnde y cmo usarlos. Las farmacias generalmente imprimen las instrucciones del medicamento slo en las cajas y no directamente en los tubos del [place].   Si su medicamento es muy caro, por favor, pngase en contacto con landry rieger llamando al [phone] y presione la opcin 4 o envenos un mensaje a travs de Clinical Cytogeneticist.   No podemos decirle cul ser su copago por los medicamentos por adelantado ya que esto es diferente dependiendo de la cobertura de su seguro. Sin embargo, es posible que podamos encontrar un medicamento sustituto a audiological scientist un formulario para que el seguro cubra el medicamento que se considera necesario.   Si se requiere una autorizacin previa para que su compaa de seguros cubra su medicamento, por favor permtanos de 1 a 2 das hbiles para completar este proceso.  Los precios de los medicamentos varan con frecuencia dependiendo del environmental consultant de dnde se surte la receta y alguna farmacias pueden ofrecer precios ms baratos.  El sitio web www.goodrx.com tiene cupones para medicamentos de health and safety inspector. Los precios aqu no tienen en cuenta lo que podra costar con la ayuda del seguro (puede ser ms barato con su seguro), pero el sitio web puede darle el precio si no utiliz tourist information centre manager.  - Puede imprimir el cupn correspondiente y llevarlo con su receta a la farmacia.  - Tambin puede pasar por nuestra oficina durante el  horario de atencin regular y education officer, museum una tarjeta de cupones de GoodRx.  - Si necesita que su receta se enve electrnicamente a una farmacia diferente, informe a nuestra oficina a travs de MyChart de [place] o por telfono llamando al [phone] y presione la opcin 4.

## 2024-01-01 NOTE — Telephone Encounter (Signed)
 Converted into refill request.

## 2024-01-01 NOTE — Telephone Encounter (Signed)
[facility] faxed refill request for the following medications: ° °losartan-hydrochlorothiazide (HYZAAR) 50-12.5 MG tablet  ° °Please advise. ° °

## 2024-01-02 ENCOUNTER — Other Ambulatory Visit: Payer: Self-pay | Admitting: Dermatology

## 2024-01-21 ENCOUNTER — Ambulatory Visit: Admitting: Dermatology

## 2024-01-21 ENCOUNTER — Encounter: Payer: Self-pay | Admitting: Dermatology

## 2024-01-21 DIAGNOSIS — Z7189 Other specified counseling: Secondary | ICD-10-CM

## 2024-01-21 DIAGNOSIS — Z86007 Personal history of in-situ neoplasm of skin: Secondary | ICD-10-CM

## 2024-01-21 DIAGNOSIS — L448 Other specified papulosquamous disorders: Secondary | ICD-10-CM

## 2024-01-21 DIAGNOSIS — L305 Pityriasis alba: Secondary | ICD-10-CM

## 2024-01-21 DIAGNOSIS — Z79899 Other long term (current) drug therapy: Secondary | ICD-10-CM

## 2024-01-21 DIAGNOSIS — L219 Seborrheic dermatitis, unspecified: Secondary | ICD-10-CM | POA: Diagnosis not present

## 2024-01-21 DIAGNOSIS — L570 Actinic keratosis: Secondary | ICD-10-CM | POA: Diagnosis not present

## 2024-01-21 DIAGNOSIS — W908XXA Exposure to other nonionizing radiation, initial encounter: Secondary | ICD-10-CM

## 2024-01-21 DIAGNOSIS — L578 Other skin changes due to chronic exposure to nonionizing radiation: Secondary | ICD-10-CM | POA: Diagnosis not present

## 2024-01-21 MED ORDER — CALCIPOTRIENE 0.005 % EX SOLN: 1.0000 | Freq: Two times a day (BID) | CUTANEOUS | 0 refills | Status: AC

## 2024-01-21 MED ORDER — FLUOROURACIL 5 % EX SOLN: 1.0000 | Freq: Two times a day (BID) | CUTANEOUS | 0 refills | Status: DC

## 2024-01-21 NOTE — Progress Notes (Signed)
 Follow-Up Visit   Subjective  Gina Middleton is a 88 y.o. female who presents for the following: follow-up seborrheic dermatitis with pityriasis amiantacea. She uses ketoconazole  2% shampoo and fluocinolone  scalp oil 3 times a week, and uses clobetasol  solution 1-2 times a day as needed. Patient states her scalp is much improved.  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   The following portions of the chart were reviewed this encounter and updated as appropriate: medications, allergies, medical history  Review of Systems:  No other skin or systemic complaints except as noted in HPI or Assessment and Plan.  Objective  Well appearing patient in no apparent distress; mood and affect are within normal limits.  A focused examination was performed of the following areas: Scalp  Relevant physical exam findings are noted in the Assessment and Plan.  vertex scalp x 3, mid crown scalp x 1, Frontal scalp x 4 (8) Hyperkeratotic scaly macules  Assessment & Plan   SEBORRHEIC DERMATITIS with Pityriasis Amiantacea scalp Exam: decrease in pink patches with greasy scale at crown scalp  Chronic and persistent condition with duration or expected duration over one year. Condition is improving with treatment but not currently at goal.   Seborrheic Dermatitis is a chronic persistent rash characterized by pinkness and scaling most commonly of the mid face but also can occur on the scalp (dandruff), ears; mid chest, mid back and groin.  It tends to be exacerbated by stress and cooler weather.  People who have neurologic disease may experience new onset or exacerbation of existing seborrheic dermatitis.  The condition is not curable but treatable and can be controlled.  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 - diffuse scaly erythematous macules and papules with underlying dyspigmentation - Discussed Prescription Field Treatment topical Chemotherapy for Severe, Chronic Confluent Actinic Changes with Pre-Cancerous Actinic Keratoses Field treatment involves treatment of an entire area of skin that has confluent Actinic Changes (Sun/ Ultraviolet light damage) and PreCancerous Actinic Keratoses by method of PhotoDynamic Therapy (PDT) and/or prescription Topical Chemotherapy agents such as 5-fluorouracil, 5-fluorouracil/calcipotriene, and/or imiquimod.  The purpose is to decrease the number of clinically evident and subclinical PreCancerous lesions to prevent progression to development of skin cancer by chemically destroying early precancer changes that may or may not be visible.  It has been shown to reduce the risk of developing skin cancer in the treated area. As a result of treatment, redness, scaling, crusting, and open sores may occur during treatment course. One or more than one of these methods may be used and may have to be used several times to control, suppress and eliminate the PreCancerous changes. Discussed treatment course, expected reaction, and possible side effects. - Recommend daily broad spectrum sunscreen SPF 30+ to sun-exposed areas, reapply every 2 hours as  needed.  - Staying in the shade or  wearing long sleeves, sun glasses (UVA+UVB protection) and wide brim hats (4-inch brim around the entire circumference of the hat) are also recommended. - Call for new or changing lesions.  -Start 5FU solution bid for 7-10 days to aa scalp (Patient advised if she can not get Calcipotriene solution she will need to use 5FU for up to 2 weeks) -Start Calcipotriene solution bid for 7-10 days to aa scalp  Reviewed course of treatment and expected reaction.  Patient advised to expect inflammation and crusting and advised that erosions are possible.  Patient advised to be diligent with sun protection during and after treatment. Handout with details of how to apply medication and what to expect provided. Counseled to keep medication out of reach of children and pets.  HYPERTROPHIC ACTINIC KERATOSIS (8) vertex scalp x 3, mid crown scalp x 1, Frontal scalp x 4 (8) Actinic keratoses are precancerous spots that appear secondary to cumulative UV radiation exposure/sun exposure over time. They are chronic with expected duration over 1 year. A portion of actinic keratoses will progress to squamous cell carcinoma of the skin. It is not possible to reliably predict which spots will progress to skin cancer and so treatment is recommended to prevent development of skin cancer.  Recommend daily broad spectrum sunscreen SPF 30+ to sun-exposed areas, reapply every 2 hours as needed.  Recommend staying in the shade or wearing long sleeves, sun glasses (UVA+UVB protection) and wide brim hats (4-inch brim around the entire circumference of the hat). Call for new or changing lesions.  Destruction of lesion - vertex scalp x 3, mid crown scalp x 1, Frontal scalp x 4 (8)  Destruction method: cryotherapy   Informed consent: discussed and consent obtained   Lesion destroyed using liquid nitrogen: Yes   Region frozen until ice ball extended beyond lesion: Yes   Outcome: patient tolerated procedure well with no complications    Post-procedure details: wound care instructions given   Additional details:  Prior to procedure, discussed risks of blister formation, small wound, skin dyspigmentation, or rare scar following cryotherapy. Recommend Vaseline ointment to treated areas while healing.   ACTINIC SKIN DAMAGE   COUNSELING AND COORDINATION OF CARE   MEDICATION MANAGEMENT   SEBORRHEIC DERMATITIS   PITYRIASIS AMIANTACEA   HISTORY OF SQUAMOUS CELL CARCINOMA IN SITU (SCCIS) OF SKIN     Return for December 29, recheck scalp.  IAndrea Kerns, CMA, am acting as scribe for Rexene Rattler, MD .   Documentation: I have reviewed the above documentation for accuracy and completeness, and I agree with the above.  Rexene Rattler, MD

## 2024-01-21 NOTE — Patient Instructions (Addendum)
 For scalp Actinic Keratoses -Start Fluorouracil  solution twice a day for 7-10 days to aa scalp (If you only get the Fluorouracil  5% solution will need to use up to 2 weeks) -Start Calcipotriene  solution twice a day for 7-10 days to aa scalp  Cryotherapy Aftercare  Wash gently with soap and water everyday.   Apply Vaseline and Band-Aid daily until healed.    Due to recent changes in healthcare laws, you may see results of your pathology and/or laboratory studies on MyChart before the doctors have had a chance to review them. We understand that in some cases there may be results that are confusing or concerning to you. Please understand that not all results are received at the same time and often the doctors may need to interpret multiple results in order to provide you with the best plan of care or course of treatment. Therefore, we ask that you please give us  2 business days to thoroughly review all your results before contacting the office for clarification. Should we see a critical lab result, you will be contacted sooner.   If You Need Anything After Your Visit  If you have any questions or concerns for your doctor, please call our main line at [phone] and press option 4 to reach your doctor's medical assistant. If no one answers, please leave a voicemail as directed and we will return your call as soon as possible. Messages left after 4 pm will be answered the following business day.   You may also send us  a message via MyChart. We typically respond to MyChart messages within 1-2 business days.  For prescription refills, please ask your pharmacy to contact our office. Our fax number is [phone].  If you have an urgent issue when the clinic is closed that cannot wait until the next business day, you can page your doctor at the number below.    Please note that while we do our best to be available for urgent issues outside of office hours, we are not available 24/7.   If you have  an urgent issue and are unable to reach us , you may choose to seek medical care at your doctor's office, retail clinic, urgent care center, or emergency room.  If you have a medical emergency, please immediately call 911 or go to the emergency department.  Pager Numbers  - Dr. Hester: [phone]  - Dr. Jackquline: [phone]  - Dr. Claudene: [phone]   - Dr. Raymund: [phone]  In the event of inclement weather, please call our main line at [phone] for an update on the status of any delays or closures.  Dermatology Medication Tips: Please keep the boxes that topical medications come in in order to help keep track of the instructions about where and how to use these. Pharmacies typically print the medication instructions only on the boxes and not directly on the medication tubes.   If your medication is too expensive, please contact our office at [phone] option 4 or send us  a message through MyChart.   We are unable to tell what your co-pay for medications will be in advance as this is different depending on your insurance coverage. However, we may be able to find a substitute medication at lower cost or fill out paperwork to get insurance to cover a needed medication.   If a prior authorization is required to get your medication covered by your insurance company, please allow us  1-2 business days to complete this process.  Drug prices often vary depending on  where the prescription is filled and some pharmacies may offer cheaper prices.  The website www.goodrx.com contains coupons for medications through different pharmacies. The prices here do not account for what the cost may be with help from insurance (it may be cheaper with your insurance), but the website can give you the price if you did not use any insurance.  - You can print the associated coupon and take it with your prescription to the pharmacy.  - You may also stop by our office during regular business hours and  pick up a GoodRx coupon card.  - If you need your prescription sent electronically to a different pharmacy, notify our office through [facility] or by phone at [phone] option 4.     Si Usted Necesita Algo Despus de Su Visita  Tambin puede enviarnos un mensaje a travs de Clinical Cytogeneticist. Por lo general respondemos a los mensajes de MyChart en el transcurso de 1 a 2 das hbiles.  Para renovar recetas, por favor pida a su farmacia que se ponga en contacto con nuestra oficina. Randi lakes de fax es [place] [phone].  Si tiene un asunto urgente cuando la clnica est cerrada y que no puede esperar hasta el siguiente da hbil, puede llamar/localizar a su doctor(a) al nmero que aparece a continuacin.   Por favor, tenga en cuenta que aunque hacemos todo lo posible para estar disponibles para asuntos urgentes fuera del horario de [place], no estamos disponibles las 24 horas del da, los 7 [address]  [address] de la [place].   Si tiene un problema urgente y no puede comunicarse con nosotros, puede optar por buscar atencin mdica  en el consultorio de su doctor(a), en una clnica privada, en un centro de atencin urgente o en una sala de emergencias.  Si tiene engineer, drilling, por favor llame inmediatamente al 911 o vaya a la sala de emergencias.  Nmeros de bper  - Dr. Hester: [phone]  - Dra. Jackquline: [phone]  - Dr. Claudene: [phone]  - Dra. Kitts: [phone]  En caso de inclemencias del [place], por favor llame a nuestra lnea principal al [phone] para una actualizacin sobre el estado de cualquier retraso o cierre.  Consejos para la medicacin en dermatologa: Por favor, guarde las cajas en las que vienen los medicamentos de uso tpico para ayudarle a seguir las instrucciones sobre dnde y cmo usarlos. Las farmacias generalmente imprimen las instrucciones del medicamento slo en las cajas y no directamente en los tubos del [place].   Si su medicamento es muy  caro, por favor, pngase en contacto con landry rieger llamando al [phone] y presione la opcin 4 o envenos un mensaje a travs de Clinical Cytogeneticist.   No podemos decirle cul ser su copago por los medicamentos por adelantado ya que esto es diferente dependiendo de la cobertura de su seguro. Sin embargo, es posible que podamos encontrar un medicamento sustituto a audiological scientist un formulario para que el seguro cubra el medicamento que se considera necesario.   Si se requiere una autorizacin previa para que su compaa de seguros cubra su medicamento, por favor permtanos de 1 a 2 das hbiles para completar este proceso.  Los precios de los medicamentos varan con frecuencia dependiendo del environmental consultant de dnde se surte la receta y alguna farmacias pueden ofrecer precios ms baratos.  El sitio web www.goodrx.com tiene cupones para medicamentos de health and safety inspector. Los precios aqu no tienen en cuenta lo que podra costar con la ayuda del seguro (puede ser ms barato  con su seguro), pero el sitio web puede darle el precio si no visual merchandiser.  - Puede imprimir el cupn correspondiente y llevarlo con su receta a la farmacia.  - Tambin puede pasar por nuestra oficina durante el horario de atencin regular y education officer, museum una tarjeta de cupones de GoodRx.  - Si necesita que su receta se enve electrnicamente a una farmacia diferente, informe a nuestra oficina a travs de MyChart de [place] o por telfono llamando al [phone] y presione la opcin 4.

## 2024-02-16 ENCOUNTER — Encounter: Payer: Self-pay | Admitting: Dermatology

## 2024-02-16 ENCOUNTER — Ambulatory Visit: Admitting: Dermatology

## 2024-02-16 DIAGNOSIS — L821 Other seborrheic keratosis: Secondary | ICD-10-CM | POA: Diagnosis not present

## 2024-02-16 DIAGNOSIS — L448 Other specified papulosquamous disorders: Secondary | ICD-10-CM

## 2024-02-16 DIAGNOSIS — L305 Pityriasis alba: Secondary | ICD-10-CM | POA: Diagnosis not present

## 2024-02-16 DIAGNOSIS — Z7189 Other specified counseling: Secondary | ICD-10-CM | POA: Diagnosis not present

## 2024-02-16 DIAGNOSIS — L570 Actinic keratosis: Secondary | ICD-10-CM

## 2024-02-16 DIAGNOSIS — L578 Other skin changes due to chronic exposure to nonionizing radiation: Secondary | ICD-10-CM

## 2024-02-16 DIAGNOSIS — W908XXA Exposure to other nonionizing radiation, initial encounter: Secondary | ICD-10-CM

## 2024-02-16 DIAGNOSIS — L219 Seborrheic dermatitis, unspecified: Secondary | ICD-10-CM

## 2024-02-16 DIAGNOSIS — D099 Carcinoma in situ, unspecified: Secondary | ICD-10-CM

## 2024-02-16 DIAGNOSIS — Z86007 Personal history of in-situ neoplasm of skin: Secondary | ICD-10-CM | POA: Diagnosis not present

## 2024-02-16 DIAGNOSIS — Z79899 Other long term (current) drug therapy: Secondary | ICD-10-CM

## 2024-02-16 MED ORDER — FLUOROURACIL 5 % EX SOLN: 1.0000 | Freq: Two times a day (BID) | CUTANEOUS | 0 refills | Status: DC

## 2024-02-16 NOTE — Progress Notes (Signed)
 "  Follow-Up Visit   Subjective  Gina Middleton is a 88 y.o. female who presents for the following: scalp recheck. Patient had some areas treated with LN2 at scalp and is using 5FU and calcipotriene  solutions along with ketoconazole  shampoo, clobetasol  solution and fluocinolone  oil with improvement to treat AK's and seb derm with pityriasis amiantacea.  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  Objective  Well appearing patient in no apparent distress; mood and affect are within normal limits.   A focused examination was performed of the following areas: scalp  Relevant exam findings are noted in the Assessment and Plan.    Assessment & Plan   SEBORRHEIC DERMATITIS w/ Pityriasis Amiantacea Exam: keratotic and crusted macules mid scalp  Chronic and persistent condition with duration or expected duration over one year. Condition is improving with treatment but not currently at goal.   Seborrheic Dermatitis is a chronic persistent rash characterized by pinkness and scaling most commonly of the mid face but also can occur on the scalp (dandruff), ears; mid chest, mid back and groin.  It tends to be exacerbated by stress and cooler weather.  People who have neurologic disease may experience new onset or exacerbation of existing seborrheic dermatitis.  The condition is not curable but treatable and can be controlled.  Treatment Plan: Cont Ketoconazole  2% shampoo 3x/wk, apply to scalp and let sit 10 minutes before rinsing out   Once finished treatment for Aks may restart Fluocinolone  scalp oil 2-3 times a week   HISTORY OF SQUAMOUS CELL CARCINOMA IN SITU OF THE SKIN - posterior crown scalp, EDC 01/01/24 - keratotic macules at crown at part including  at edge of EDC site - Recommend regular full body skin exams - Recommend daily broad spectrum sunscreen SPF 30+ to sun-exposed areas, reapply every 2 hours as needed.  - Call if any new or changing lesions are noted between office visits - restart 5FU solution bid for 14 days total to aa scalp - restart Calcipotriene  solution bid for 14 days total to aa scalp - Recheck on f/up, consider repeat biopsy if not clear   Reviewed course of treatment and expected reaction.  Patient advised to expect inflammation and crusting and advised that erosions are possible.  Patient advised to be diligent with sun protection during and after treatment. Handout with details of how to apply medication and what to expect provided. Counseled to keep medication out of reach of children and pets.   ACTINIC DAMAGE WITH PRECANCEROUS ACTINIC KERATOSES Counseling for Topical Chemotherapy Management: Patient exhibits: - Severe, confluent actinic changes with pre-cancerous actinic keratoses that is secondary to cumulative UV radiation exposure over time - Condition that is severe; chronic, not at goal. - diffuse scaly erythematous macules and papules with underlying dyspigmentation - Discussed Prescription Field Treatment topical Chemotherapy for Severe, Chronic Confluent Actinic Changes with Pre-Cancerous Actinic Keratoses Field treatment involves treatment of an entire area of skin that has confluent Actinic Changes (Sun/ Ultraviolet light damage) and PreCancerous Actinic Keratoses by method of PhotoDynamic Therapy (PDT) and/or prescription Topical Chemotherapy agents such as 5-fluorouracil , 5-fluorouracil /calcipotriene , and/or imiquimod.  The purpose is to decrease the number of clinically evident and subclinical PreCancerous lesions to prevent progression to development of skin cancer by chemically destroying early  precancer changes that may or may not be visible.  It has been shown to reduce the risk of developing skin  cancer in the treated area. As a result of treatment, redness, scaling, crusting, and open sores may occur during treatment course. One or more than one of these methods may be used and may have to be used several times to control, suppress and eliminate the PreCancerous changes. Discussed treatment course, expected reaction, and possible side effects. - Recommend daily broad spectrum sunscreen SPF 30+ to sun-exposed areas, reapply every 2 hours as needed.  - Staying in the shade or wearing long sleeves, sun glasses (UVA+UVB protection) and wide brim hats (4-inch brim around the entire circumference of the hat) are also recommended. - Call for new or changing lesions.  -restart 5FU solution bid for 14 days to aa scalp (additional 7 days) -restart Calcipotriene  solution bid for 14 days to aa scalp (additional 7 days)   Reviewed course of treatment and expected reaction.  Patient advised to expect inflammation and crusting and advised that erosions are possible.  Patient advised to be diligent with sun protection during and after treatment. Handout with details of how to apply medication and what to expect provided. Counseled to keep medication out of reach of children and pets.   SEBORRHEIC KERATOSIS Exam: 4 mm waxy tan papule at left nasal tip, Benign features under dermoscopy. Pt states been there a bout a year and has grown.  Treatment Plan: Recheck on follow up, consider cryotherapy vs biopsy  SQUAMOUS CELL CARCINOMA IN SITU   This Visit - Fluorouracil  5 % SOLN - Apply 1 Application topically in the morning and at bedtime. Bid to aa scalp for 7-10 days for actinic keratoses  Return for as scheduled, with Dr. Jackquline, AK follow up, recheck SCCis at scalp.  Gina Middleton, RMA, am acting as scribe for Rexene Jackquline, MD .   Documentation: I have reviewed the above documentation for accuracy and completeness, and I agree with the above.  Rexene Jackquline, MD    "

## 2024-02-16 NOTE — Patient Instructions (Addendum)
 -restart 5FU solution twice daily 14 days to affected areas at scalp -restart Calcipotriene  solution twice for 14 days to affected areas scalp Cont Ketoconazole  2% shampoo 3x/wk, apply to scalp and lit sit 10 minutes before rinsing out   Continue Fluocinolone  scalp oil 2-3 times a week   Due to recent changes in healthcare laws, you may see results of your pathology and/or laboratory studies on MyChart before the doctors have had a chance to review them. We understand that in some cases there may be results that are confusing or concerning to you. Please understand that not all results are received at the same time and often the doctors may need to interpret multiple results in order to provide you with the best plan of care or course of treatment. Therefore, we ask that you please give us  2 business days to thoroughly review all your results before contacting the office for clarification. Should we see a critical lab result, you will be contacted sooner.   If You Need Anything After Your Visit  If you have any questions or concerns for your doctor, please call our main line at [phone] and press option 4 to reach your doctor's medical assistant. If no one answers, please leave a voicemail as directed and we will return your call as soon as possible. Messages left after 4 pm will be answered the following business day.   You may also send us  a message via MyChart. We typically respond to MyChart messages within 1-2 business days.  For prescription refills, please ask your pharmacy to contact our office. Our fax number is [phone].  If you have an urgent issue when the clinic is closed that cannot wait until the next business day, you can page your doctor at the number below.    Please note that while we do our best to be available for urgent issues outside of office hours, we are not available 24/7.   If you have an urgent issue and are unable to reach us , you may choose to seek medical  care at your doctor's office, retail clinic, urgent care center, or emergency room.  If you have a medical emergency, please immediately call 911 or go to the emergency department.  Pager Numbers  - Dr. Hester: [phone]  - Dr. Jackquline: [phone]  - Dr. Claudene: [phone]   - Dr. Raymund: [phone]  In the event of inclement weather, please call our main line at [phone] for an update on the status of any delays or closures.  Dermatology Medication Tips: Please keep the boxes that topical medications come in in order to help keep track of the instructions about where and how to use these. Pharmacies typically print the medication instructions only on the boxes and not directly on the medication tubes.   If your medication is too expensive, please contact our office at [phone] option 4 or send us  a message through MyChart.   We are unable to tell what your co-pay for medications will be in advance as this is different depending on your insurance coverage. However, we may be able to find a substitute medication at lower cost or fill out paperwork to get insurance to cover a needed medication.   If a prior authorization is required to get your medication covered by your insurance company, please allow us  1-2 business days to complete this process.  Drug prices often vary depending on where the prescription is filled and some pharmacies may offer cheaper prices.  The website www.goodrx.com  contains coupons for medications through different pharmacies. The prices here do not account for what the cost may be with help from insurance (it may be cheaper with your insurance), but the website can give you the price if you did not use any insurance.  - You can print the associated coupon and take it with your prescription to the pharmacy.  - You may also stop by our office during regular business hours and pick up a GoodRx coupon card.  - If you need your prescription sent  electronically to a different pharmacy, notify our office through [facility] or by phone at [phone] option 4.     Si Usted Necesita Algo Despus de Su Visita  Tambin puede enviarnos un mensaje a travs de Clinical Cytogeneticist. Por lo general respondemos a los mensajes de MyChart en el transcurso de 1 a 2 das hbiles.  Para renovar recetas, por favor pida a su farmacia que se ponga en contacto con nuestra oficina. Randi lakes de fax es [place] [phone].  Si tiene un asunto urgente cuando la clnica est cerrada y que no puede esperar hasta el siguiente da hbil, puede llamar/localizar a su doctor(a) al nmero que aparece a continuacin.   Por favor, tenga en cuenta que aunque hacemos todo lo posible para estar disponibles para asuntos urgentes fuera del horario de [place], no estamos disponibles las 24 horas del da, los 7 [address]  [address] de la [place].   Si tiene un problema urgente y no puede comunicarse con nosotros, puede optar por buscar atencin mdica  en el consultorio de su doctor(a), en una clnica privada, en un centro de atencin urgente o en una sala de emergencias.  Si tiene engineer, drilling, por favor llame inmediatamente al 911 o vaya a la sala de emergencias.  Nmeros de bper  - Dr. Hester: [phone]  - Dra. Jackquline: [phone]  - Dr. Claudene: [phone]  - Dra. Kitts: [phone]  En caso de inclemencias del [place], por favor llame a nuestra lnea principal al [phone] para una actualizacin sobre el estado de cualquier retraso o cierre.  Consejos para la medicacin en dermatologa: Por favor, guarde las cajas en las que vienen los medicamentos de uso tpico para ayudarle a seguir las instrucciones sobre dnde y cmo usarlos. Las farmacias generalmente imprimen las instrucciones del medicamento slo en las cajas y no directamente en los tubos del [place].   Si su medicamento es muy caro, por favor, pngase en contacto con landry rieger llamando al  [phone] y presione la opcin 4 o envenos un mensaje a travs de Clinical Cytogeneticist.   No podemos decirle cul ser su copago por los medicamentos por adelantado ya que esto es diferente dependiendo de la cobertura de su seguro. Sin embargo, es posible que podamos encontrar un medicamento sustituto a audiological scientist un formulario para que el seguro cubra el medicamento que se considera necesario.   Si se requiere una autorizacin previa para que su compaa de seguros cubra su medicamento, por favor permtanos de 1 a 2 das hbiles para completar este proceso.  Los precios de los medicamentos varan con frecuencia dependiendo del environmental consultant de dnde se surte la receta y alguna farmacias pueden ofrecer precios ms baratos.  El sitio web www.goodrx.com tiene cupones para medicamentos de health and safety inspector. Los precios aqu no tienen en cuenta lo que podra costar con la ayuda del seguro (puede ser ms barato con su seguro), pero el sitio web puede darle el precio si no utiliz tourist information centre manager.  -  Puede imprimir el cupn correspondiente y llevarlo con su receta a la farmacia.  - Tambin puede pasar por nuestra oficina durante el horario de atencin regular y education officer, museum una tarjeta de cupones de GoodRx.  - Si necesita que su receta se enve electrnicamente a una farmacia diferente, informe a nuestra oficina a travs de MyChart de [place] o por telfono llamando al [phone] y presione la opcin 4.

## 2024-02-24 ENCOUNTER — Ambulatory Visit (INDEPENDENT_AMBULATORY_CARE_PROVIDER_SITE_OTHER): Admitting: Family Medicine

## 2024-02-24 ENCOUNTER — Encounter: Payer: Self-pay | Admitting: Family Medicine

## 2024-02-24 VITALS — BP 130/63 | HR 66 | Temp 97.8°F | Ht 63.0 in | Wt 135.1 lb

## 2024-02-24 DIAGNOSIS — I10 Essential (primary) hypertension: Secondary | ICD-10-CM

## 2024-02-24 DIAGNOSIS — N1832 Chronic kidney disease, stage 3b: Secondary | ICD-10-CM

## 2024-02-24 DIAGNOSIS — D509 Iron deficiency anemia, unspecified: Secondary | ICD-10-CM | POA: Diagnosis not present

## 2024-02-24 DIAGNOSIS — E538 Deficiency of other specified B group vitamins: Secondary | ICD-10-CM | POA: Diagnosis not present

## 2024-02-24 DIAGNOSIS — R7303 Prediabetes: Secondary | ICD-10-CM

## 2024-02-24 DIAGNOSIS — E782 Mixed hyperlipidemia: Secondary | ICD-10-CM

## 2024-02-24 DIAGNOSIS — Z0000 Encounter for general adult medical examination without abnormal findings: Secondary | ICD-10-CM | POA: Diagnosis not present

## 2024-02-24 DIAGNOSIS — N1831 Chronic kidney disease, stage 3a: Secondary | ICD-10-CM

## 2024-02-24 DIAGNOSIS — E559 Vitamin D deficiency, unspecified: Secondary | ICD-10-CM | POA: Diagnosis not present

## 2024-02-24 NOTE — Progress Notes (Signed)
 "    Complete physical exam   Patient: Gina Middleton   DOB: 1935-07-02   89 y.o. Female  MRN: [id number] Visit Date: 02/24/2024  Today's healthcare provider: LAURAINE LOISE BUOY, DO   Chief Complaint  Patient presents with   Annual Exam    Diet- General Exercise- yes, walking Overall feeling- Okay  Sleep- Okay Concerns- None  Declined vaccines   Subjective    Gina Middleton is a 89 y.o. female who presents today for a complete physical exam.   HPI HPI     Annual Exam    Additional comments: Diet- General Exercise- yes, walking Overall feeling- Okay  Sleep- Okay Concerns- None  Declined vaccines      Last edited by Terrel Powell CROME, CMA on 02/24/2024 10:14 AM.      Gina Middleton is an 89 year old female who presents for an annual physical exam.  In early December, she experienced a severe episode of gastroenteritis with significant vomiting and diarrhea lasting about a week. During this time, she was unable to eat and relied on Jell-O, ginger ale, and crackers. Her appetite is gradually returning, and she is making an effort to eat more regularly. No current nausea, fever, or chills.  She experiences stiffness in her knees. She has not sought orthopedic consultation for this issue. Her hips are doing well post-replacement, with no pain reported.  She is actively managing a cattle farm, which involves daily physical activity and travel between locations. She reports sleeping well and denies any chest pain, shortness of breath, lightheadedness, or dizziness. She occasionally experiences tingling in her feet at night, which she alleviates by wiggling them to improve circulation.  She is taking losartan  hydrochlorothiazide  and amlodipine  for blood pressure management without associated dizziness or lightheadedness. She has a scalp condition being treated with clobetasol  and fluocinolone . She has a history of vertigo, with the last episode in March 2024, and  has meclizine  at home but has not needed it recently. She also has Zofran  for nausea but has not required it since the gastroenteritis episode.  She visits the dentist three times a year and has been informed about a lump near her teeth, which is not a concern. She receives regular eye exams every three months due to needing injections in one eye.     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   Posterior crown scalp, EDC 01/01/24   Past Surgical History:  Procedure Laterality Date   APPENDECTOMY     BACK SURGERY  1969   CARPAL TUNNEL RELEASE Right 12/22/2019   Procedure: CARPAL TUNNEL RELEASE;  Surgeon: Mardee Lynwood SQUIBB, MD;  Location: [facility];  Service: Orthopedics;  Laterality: Right;   CHOLECYSTECTOMY     CYSTOSCOPY W/ RETROGRADES Left 04/09/2018   Procedure: CYSTOSCOPY WITH RETROGRADE PYELOGRAM;  Surgeon: Twylla Glendia BROCKS, MD;  Location: [facility];  Service: Urology;  Laterality: Left;   CYSTOSCOPY WITH STENT PLACEMENT Left 04/09/2018   Procedure: CYSTOSCOPY WITH STENT PLACEMENT-LEFT;  Surgeon: Twylla,  Glendia BROCKS, MD;  Location: [facility];  Service: Urology;  Laterality: Left;   CYSTOSCOPY WITH URETEROSCOPY Left 04/09/2018   Procedure: POSSIBLE URETEROSCOPY-LEFT;  Surgeon: Twylla Glendia BROCKS, MD;  Location: [facility];  Service: Urology;  Laterality: Left;   JOINT REPLACEMENT     SPINE SURGERY     TOTAL HIP ARTHROPLASTY Right 08/25/2019   Procedure: TOTAL HIP ARTHROPLASTY;  Surgeon: Mardee Lynwood SQUIBB, MD;  Location: [facility];  Service: Orthopedics;  Laterality: Right;    TOTAL HIP ARTHROPLASTY Left 04/19/2020   Procedure: TOTAL HIP ARTHROPLASTY;  Surgeon: Mardee Lynwood SQUIBB, MD;  Location: [facility];  Service: Orthopedics;  Laterality: Left;   Social History   Socioeconomic History   Marital status: Married    Spouse name: Quintin   Number of children: 0   Years of education: [employer] education level: Master's degree (e.g., MA, MS, MEng, MEd, MSW, MBA)  Occupational History   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   Occupation: Works part-time at [employer] and [employer]   Occupation: [employer]    Employer: [employer]    Comment: Part Time  Tobacco Use   Smoking status: Never   Smokeless tobacco: Never  Vaping Use   Vaping status: Never Used  Substance and Sexual Activity   Alcohol use: Never   Drug use: Never   Sexual activity: Not Currently  Other Topics Concern   Not on file  Social History Narrative   Not on file   Social Drivers of Health   Tobacco Use: Low Risk (02/24/2024)   Patient History    Smoking Tobacco Use: Never    Smokeless Tobacco Use: Never    Passive Exposure: Not on file  Financial Resource Strain: Low Risk (02/23/2024)   Overall Financial Resource Strain (CARDIA)    Difficulty of Paying Living Expenses: Not hard at all  Food Insecurity: No Food Insecurity (02/23/2024)   Epic    Worried About Radiation Protection Practitioner of Food in the Last Year: Never true    Ran Out of Food in the Last Year: Never true  Transportation Needs: No Transportation Needs (02/23/2024)   Epic    Lack of Transportation (Medical): No    Lack of Transportation (Non-Medical): No  Physical Activity: Unknown (02/23/2024)   Exercise Vital Sign    Days of Exercise per Week: Patient declined    Minutes of Exercise per Session: Not on file  Stress: No Stress Concern Present (02/23/2024)   [employer] of Occupational Health - Occupational Stress Questionnaire    Feeling of Stress: Not at all  Social Connections: Moderately Isolated (02/23/2024)    Social Connection and Isolation Panel    Frequency of Communication with Friends and Family: More than three times a week    Frequency of Social Gatherings with Friends and Family: Once a week    Attends Religious Services: Patient declined    Database Administrator or Organizations: Yes    Attends Banker Meetings: Patient declined    Marital Status: Widowed  Intimate Partner Violence: Not At Risk (07/15/2023)   Humiliation, Afraid, Rape, and Kick questionnaire    Fear of Current or Ex-Partner: No    Emotionally Abused: No    Physically Abused: No    Sexually Abused: No  Depression (PHQ2-9): Low Risk (02/24/2024)   Depression (PHQ2-9)    PHQ-2 Score: 0  Alcohol Screen: Low Risk (02/23/2024)   Alcohol Screen    Last Alcohol Screening Score (AUDIT): 0  Housing:  Low Risk (02/23/2024)   Epic    Unable to Pay for Housing in the Last Year: No    Number of Times Moved in the Last Year: 0    Homeless in the Last Year: No  Utilities: Not At Risk (07/15/2023)   AHC Utilities    Threatened with loss of utilities: No  Health Literacy: Adequate Health Literacy (07/15/2023)   B1300 Health Literacy    Frequency of need for help with medical instructions: Never   Family Status  Relation Name Status   Mother Levorn Deceased   Father Marinell Deceased       Died after his third heart attack   Sister Rock Deceased       Died from a heart attack   MGM  Deceased  No partnership data on file   Family History  Problem Relation Age of Onset   Hypertension Mother    Alzheimer's disease Mother    Heart attack Father        x's 3   Heart disease Father    Diabetes Sister    Heart attack Sister    Breast cancer Maternal Grandmother    Allergies[1]  Patient Care Team: Bary Limbach, Lauraine SAILOR, DO as PCP - General (Family Medicine) Hester Alm BROCKS, MD (Dermatology) Twylla Glendia BROCKS, MD (Urology) Myrna Adine Anes, MD as Consulting Physician (Ophthalmology) Mardee, Lynwood SQUIBB, MD (Orthopedic  Surgery)   Medications: Show/hide medication list[2]  Review of Systems  Constitutional:  Negative for chills, fatigue and fever.  HENT:  Negative for congestion, ear pain, rhinorrhea, sneezing and sore throat.   Eyes: Negative.  Negative for pain and redness.  Respiratory:  Negative for cough, shortness of breath and wheezing.   Cardiovascular:  Negative for chest pain and leg swelling.  Gastrointestinal:  Negative for abdominal pain, blood in stool, constipation, diarrhea and nausea.  Endocrine: Negative for polydipsia and polyphagia.  Genitourinary: Negative.  Negative for dysuria, flank pain, hematuria, pelvic pain, vaginal bleeding and vaginal discharge.  Musculoskeletal:  Positive for arthralgias (knees bilaterally). Negative for back pain, gait problem and joint swelling.  Skin:  Negative for rash.  Neurological: Negative.  Negative for dizziness, tremors, seizures, weakness, light-headedness, numbness and headaches.  Hematological:  Negative for adenopathy.  Psychiatric/Behavioral: Negative.  Negative for behavioral problems, confusion and dysphoric mood. The patient is not nervous/anxious and is not hyperactive.        Objective    BP 130/63 (BP Location: Left Arm, Patient Position: Sitting, Cuff Size: Normal)   Pulse 66   Temp 97.8 F (36.6 C) (Oral)   Ht 5' 3 (1.6 m)   Wt 135 lb 1.6 oz (61.3 kg)   SpO2 100%   BMI 23.93 kg/m    Physical Exam Vitals and nursing note reviewed.  Constitutional:      General: She is awake.     Appearance: Normal appearance.  HENT:     Head: Normocephalic and atraumatic.     Right Ear: Tympanic membrane, ear canal and external ear normal.     Left Ear: Tympanic membrane, ear canal and external ear normal.     Nose: Nose normal.     Mouth/Throat:     Mouth: Mucous membranes are moist.     Pharynx: Oropharynx is clear. No oropharyngeal exudate or posterior oropharyngeal erythema.  Eyes:     General: No scleral icterus.     Extraocular Movements: Extraocular movements intact.     Conjunctiva/sclera: Conjunctivae normal.     Pupils:  Pupils are equal, round, and reactive to light.  Neck:     Thyroid: No thyromegaly or thyroid tenderness.  Cardiovascular:     Rate and Rhythm: Normal rate and regular rhythm.     Pulses: Normal pulses.     Heart sounds: Normal heart sounds.  Pulmonary:     Effort: Pulmonary effort is normal. No tachypnea, bradypnea or respiratory distress.     Breath sounds: Normal breath sounds. No stridor. No wheezing, rhonchi or rales.  Abdominal:     General: Bowel sounds are normal. There is no distension.     Palpations: Abdomen is soft. There is no mass.     Tenderness: There is no abdominal tenderness. There is no guarding.     Hernia: No hernia is present.  Musculoskeletal:     Cervical back: Normal range of motion and neck supple.     Right lower leg: No edema.     Left lower leg: No edema.  Lymphadenopathy:     Cervical: No cervical adenopathy.  Skin:    General: Skin is warm and dry.  Neurological:     Mental Status: She is alert and oriented to person, place, and time. Mental status is at baseline.  Psychiatric:        Mood and Affect: Mood normal.        Behavior: Behavior normal.      Last depression screening scores    02/24/2024   10:24 AM 08/19/2023    9:42 AM 07/15/2023   10:42 AM  PHQ 2/9 Scores  PHQ - 2 Score 0 0 0  PHQ- 9 Score 0 0  0      Data saved with a previous flowsheet row definition   Last fall risk screening    02/24/2024   10:24 AM  Fall Risk   Falls in the past year? 0  Number falls in past yr: 0  Injury with Fall? 0  Risk for fall due to : No Fall Risks   Last Audit-C alcohol use screening    02/23/2024    5:40 PM  Alcohol Use Disorder Test (AUDIT)  1. How often do you have a drink containing alcohol? 0   2. How many drinks containing alcohol do you have on a typical day when you are drinking? 0  3. How often do you have six or more  drinks on one occasion? 0   AUDIT-C Score 0     Manually entered by patient   A score of 3 or more in women, and 4 or more in men indicates increased risk for alcohol abuse, EXCEPT if all of the points are from question 1   Results for orders placed or performed in visit on 02/24/24  CBC with Differential/Platelet  Result Value Ref Range   WBC 9.8 3.4 - 10.8 x10E3/uL   RBC 3.92 3.77 - 5.28 x10E6/uL   Hemoglobin 12.1 11.1 - 15.9 g/dL   Hematocrit 64.1 65.9 - 46.6 %   MCV 91 79 - 97 fL   MCH 30.9 26.6 - 33.0 pg   MCHC 33.8 31.5 - 35.7 g/dL   RDW 87.4 88.2 - 84.5 %   Platelets 233 150 - 450 x10E3/uL   Neutrophils 68 Not Estab. %   Lymphs 17 Not Estab. %   Monocytes 9 Not Estab. %   Eos 5 Not Estab. %   Basos 1 Not Estab. %   Neutrophils Absolute 6.5 1.4 - 7.0 x10E3/uL   Lymphocytes Absolute 1.7 0.7 -  3.1 x10E3/uL   Monocytes Absolute 0.9 0.1 - 0.9 x10E3/uL   EOS (ABSOLUTE) 0.5 (H) 0.0 - 0.4 x10E3/uL   Basophils Absolute 0.1 0.0 - 0.2 x10E3/uL   Immature Granulocytes 0 Not Estab. %   Immature Grans (Abs) 0.0 0.0 - 0.1 x10E3/uL  Comprehensive metabolic panel with GFR  Result Value Ref Range   Glucose 107 (H) 70 - 99 mg/dL   BUN 23 8 - 27 mg/dL   Creatinine, Ser 8.59 (H) 0.57 - 1.00 mg/dL   eGFR 37 (L) >40 fO/fpw/8.26   BUN/Creatinine Ratio 16 12 - 28   Sodium 141 134 - 144 mmol/L   Potassium 4.7 3.5 - 5.2 mmol/L   Chloride 102 96 - 106 mmol/L   CO2 23 20 - 29 mmol/L   Calcium 9.4 8.7 - 10.3 mg/dL   Total Protein 7.0 6.0 - 8.5 g/dL   Albumin 4.4 3.7 - 4.7 g/dL   Globulin, Total 2.6 1.5 - 4.5 g/dL   Bilirubin Total 0.5 0.0 - 1.2 mg/dL   Alkaline Phosphatase 86 48 - 129 IU/L   AST 20 0 - 40 IU/L   ALT 14 0 - 32 IU/L  Hemoglobin A1c  Result Value Ref Range   Hgb A1c MFr Bld 5.7 (H) 4.8 - 5.6 %   Est. average glucose Bld gHb Est-mCnc 117 mg/dL  Lipid Panel With LDL/HDL Ratio  Result Value Ref Range   Cholesterol, Total 152 100 - 199 mg/dL   Triglycerides 748 (H) 0 - 149  mg/dL   HDL 30 (L) >60 mg/dL   VLDL Cholesterol Cal 42 (H) 5 - 40 mg/dL   LDL Chol Calc (NIH) 80 0 - 99 mg/dL   LDL/HDL Ratio 2.7 0.0 - 3.2 ratio  B12  Result Value Ref Range   Vitamin B-12 538 232 - 1,245 pg/mL  Vitamin D  (25 hydroxy)  Result Value Ref Range   Vit D, 25-Hydroxy 31.8 30.0 - 100.0 ng/mL    Assessment & Plan    Routine Health Maintenance and Physical Exam  Exercise Activities and Dietary recommendations  Goals      Cut out extra servings     DIET - EAT MORE FRUITS AND VEGETABLES     DIET - INCREASE WATER INTAKE     Recommend to drink at least 6-8 8oz glasses of water per day.        Immunization History  Administered Date(s) Administered   PFIZER(Purple Top)SARS-COV-2 Vaccination 11/18/2019, 12/09/2019   Pneumococcal Conjugate-13 08/28/2016   Pneumococcal Polysaccharide-23 09/12/2017   Td 05/10/1995    Health Maintenance  Topic Date Due   Influenza Vaccine  05/18/2024 (Originally 09/19/2023)   DTaP/Tdap/Td (2 - Tdap) 08/18/2024 (Originally 05/09/2005)   COVID-19 Vaccine (3 - Pfizer risk series) 08/18/2024 (Originally 01/06/2020)   Zoster Vaccines- Shingrix (1 of 2) 08/18/2024 (Originally 05/09/1954)   Medicare Annual Wellness (AWV)  07/14/2024   Bone Density Scan  06/07/2026   Pneumococcal Vaccine: 50+ Years  Completed   Meningococcal B Vaccine  Aged Out    Discussed health benefits of physical activity, and encouraged her to engage in regular exercise appropriate for her age and condition.   Annual physical exam  Primary hypertension -     Comprehensive metabolic panel with GFR  Iron deficiency anemia, unspecified iron deficiency anemia type -     CBC with Differential/Platelet  Prediabetes -     Comprehensive metabolic panel with GFR -     Hemoglobin A1c  Stage 3b chronic kidney disease (  HCC) -     Comprehensive metabolic panel with GFR  Mixed hyperlipidemia -     Lipid Panel With LDL/HDL Ratio  B12 deficiency -     Vitamin  B12  Vitamin D  deficiency -     VITAMIN D  25 Hydroxy (Vit-D Deficiency, Fractures)     Annual physical exam Physical exam overall unremarkable except as noted above. Routine lab work ordered as noted. Declined flu vaccination. Discussed Tamiflu for influenza management. Regular dental and eye exams maintained. No recent vertigo or nausea. - Consider Tamiflu if flu symptoms develop, ensuring testing and treatment within 72 hours. - Continue regular dental and eye exams. - Maintain current medications for vertigo and nausea as needed.  Primary hypertension Chronic, stable on current medications. - Continue losartan -hydrochlorothiazide  50-12.5 mg daily and amlodipine  5 mg daily.  Stage 3b chronic kidney disease Chronic kidney disease stage 3b monitored with routine labs, currently stable. Tylenol  recommended for fever due to kidney function. - Continue to monitor kidney function. - Use Tylenol  for fever management.  Vitamin B12 deficiency - Recheck level today. - Continue Vitamin B12 supplementation.  Vitamin D  deficiency - Recheck level today.  - Continue Vitamin D  supplementation.     Return in about 6 months (around 08/23/2024) for HTN, Chronic f/u w/Dr. Franchot.     I discussed the assessment and treatment plan with the patient  The patient was provided an opportunity to ask questions and all were answered. The patient agreed with the plan and demonstrated an understanding of the instructions.   The patient was advised to call back or seek an in-person evaluation if the symptoms worsen or if the condition fails to improve as anticipated.    LAURAINE LOISE BUOY, DO  [facility] [phone] (phone) [phone] (fax)  [facility]     [1]  Allergies Allergen Reactions   Other Anaphylaxis    A muscle relaxer in the 70s but cannot remember name of the medication that caused it   Hydrocodone -Acetaminophen  Other (See Comments)     dizziness   Lisinopril      Hair thinning   Oxycodone -Acetaminophen  Nausea And Vomiting   Iodinated Contrast Media Rash  [2]  Outpatient Medications Prior to Visit  Medication Sig   acetaminophen  (TYLENOL ) 325 MG tablet Take 325 mg by mouth every 6 (six) hours as needed.   amLODipine  (NORVASC ) 5 MG tablet Take 1 tablet (5 mg total) by mouth daily.   aspirin  EC 81 MG tablet Take 81 mg by mouth daily. Swallow whole.   Calcipotriene  0.005 % solution Apply 1 Application topically 2 (two) times daily. Bid to aa scalp for 7-10 days for actinic keratoses   clobetasol  (TEMOVATE ) 0.05 % external solution APPLY EXTERNALLY TO ITCHY IRRITATED SCALP ONCE TO TWICE DAILY AS NEEDED   Fluocinolone  Acetonide Scalp 0.01 % OIL Apply to scalp at bedtime cover with a cap, wash out in the morning   Fluorouracil  5 % SOLN Apply 1 Application topically in the morning and at bedtime. Bid to aa scalp for 7-10 days for actinic keratoses   ketoconazole  (NIZORAL ) 2 % shampoo APPLY AND MASSAGE INTO SCALP 3 TIMES PER WEEK, LEAVE IN FOR 10 MINUTES BEFORE RINSING OUT   losartan -hydrochlorothiazide  (HYZAAR) 50-12.5 MG tablet Take 1 tablet by mouth daily.   meclizine  (ANTIVERT ) 25 MG tablet Take 1 tablet (25 mg total) by mouth 3 (three) times daily as needed for dizziness.   ondansetron  (ZOFRAN -ODT) 4 MG disintegrating tablet Take 1 tablet (4  mg total) by mouth every 8 (eight) hours as needed for nausea or vomiting.   VITAMIN D  PO Take 5,000 Units by mouth daily at 6 (six) AM.   No facility-administered medications prior to visit.   "

## 2024-02-25 LAB — VITAMIN D 25 HYDROXY (VIT D DEFICIENCY, FRACTURES): Vit D, 25-Hydroxy: 31.8 ng/mL (ref 30.0–100.0)

## 2024-02-25 LAB — VITAMIN B12: Vitamin B-12: 538 pg/mL (ref 232–1245)

## 2024-02-25 LAB — COMPREHENSIVE METABOLIC PANEL WITH GFR
ALT: 14 IU/L (ref 0–32)
AST: 20 IU/L (ref 0–40)
Albumin: 4.4 g/dL (ref 3.7–4.7)
Alkaline Phosphatase: 86 IU/L (ref 48–129)
BUN/Creatinine Ratio: 16 (ref 12–28)
BUN: 23 mg/dL (ref 8–27)
Bilirubin Total: 0.5 mg/dL (ref 0.0–1.2)
CO2: 23 mmol/L (ref 20–29)
Calcium: 9.4 mg/dL (ref 8.7–10.3)
Chloride: 102 mmol/L (ref 96–106)
Creatinine, Ser: 1.4 mg/dL — ABNORMAL HIGH (ref 0.57–1.00)
Globulin, Total: 2.6 g/dL (ref 1.5–4.5)
Glucose: 107 mg/dL — ABNORMAL HIGH (ref 70–99)
Potassium: 4.7 mmol/L (ref 3.5–5.2)
Sodium: 141 mmol/L (ref 134–144)
Total Protein: 7 g/dL (ref 6.0–8.5)
eGFR: 37 mL/min/1.73 — ABNORMAL LOW

## 2024-02-25 LAB — CBC WITH DIFFERENTIAL/PLATELET
Basophils Absolute: 0.1 x10E3/uL (ref 0.0–0.2)
Basos: 1 %
EOS (ABSOLUTE): 0.5 x10E3/uL — ABNORMAL HIGH (ref 0.0–0.4)
Eos: 5 %
Hematocrit: 35.8 % (ref 34.0–46.6)
Hemoglobin: 12.1 g/dL (ref 11.1–15.9)
Immature Grans (Abs): 0 x10E3/uL (ref 0.0–0.1)
Immature Granulocytes: 0 %
Lymphocytes Absolute: 1.7 x10E3/uL (ref 0.7–3.1)
Lymphs: 17 %
MCH: 30.9 pg (ref 26.6–33.0)
MCHC: 33.8 g/dL (ref 31.5–35.7)
MCV: 91 fL (ref 79–97)
Monocytes Absolute: 0.9 x10E3/uL (ref 0.1–0.9)
Monocytes: 9 %
Neutrophils Absolute: 6.5 x10E3/uL (ref 1.4–7.0)
Neutrophils: 68 %
Platelets: 233 x10E3/uL (ref 150–450)
RBC: 3.92 x10E6/uL (ref 3.77–5.28)
RDW: 12.5 % (ref 11.7–15.4)
WBC: 9.8 x10E3/uL (ref 3.4–10.8)

## 2024-02-25 LAB — LIPID PANEL WITH LDL/HDL RATIO
Cholesterol, Total: 152 mg/dL (ref 100–199)
HDL: 30 mg/dL — ABNORMAL LOW
LDL Chol Calc (NIH): 80 mg/dL (ref 0–99)
LDL/HDL Ratio: 2.7 ratio (ref 0.0–3.2)
Triglycerides: 251 mg/dL — ABNORMAL HIGH (ref 0–149)
VLDL Cholesterol Cal: 42 mg/dL — ABNORMAL HIGH (ref 5–40)

## 2024-02-25 LAB — HEMOGLOBIN A1C
Est. average glucose Bld gHb Est-mCnc: 117 mg/dL
Hgb A1c MFr Bld: 5.7 % — ABNORMAL HIGH (ref 4.8–5.6)

## 2024-03-09 ENCOUNTER — Ambulatory Visit: Admitting: Dermatology

## 2024-03-10 ENCOUNTER — Ambulatory Visit: Payer: Self-pay | Admitting: Family Medicine

## 2024-03-11 ENCOUNTER — Ambulatory Visit: Admitting: Dermatology

## 2024-03-11 DIAGNOSIS — L305 Pityriasis alba: Secondary | ICD-10-CM

## 2024-03-11 DIAGNOSIS — Z86007 Personal history of in-situ neoplasm of skin: Secondary | ICD-10-CM | POA: Diagnosis not present

## 2024-03-11 DIAGNOSIS — L570 Actinic keratosis: Secondary | ICD-10-CM

## 2024-03-11 DIAGNOSIS — W908XXA Exposure to other nonionizing radiation, initial encounter: Secondary | ICD-10-CM

## 2024-03-11 DIAGNOSIS — L219 Seborrheic dermatitis, unspecified: Secondary | ICD-10-CM

## 2024-03-11 DIAGNOSIS — L448 Other specified papulosquamous disorders: Secondary | ICD-10-CM

## 2024-03-11 DIAGNOSIS — L578 Other skin changes due to chronic exposure to nonionizing radiation: Secondary | ICD-10-CM | POA: Diagnosis not present

## 2024-03-11 DIAGNOSIS — D099 Carcinoma in situ, unspecified: Secondary | ICD-10-CM

## 2024-03-11 MED ORDER — FLUOROURACIL 5 % EX SOLN: 1.0000 | Freq: Two times a day (BID) | CUTANEOUS | 0 refills | Status: AC

## 2024-03-11 NOTE — Progress Notes (Signed)
 "  Follow-Up Visit   Subjective  Gina Middleton is a 89 y.o. female who presents for the following: 3 month follow-up AKs of the scalp. She treated with 5FU and Calcipotriene  solution to scalp x 9 days (off x 1 wk). Areas scabbed, no burning. Hx of SCC in situ at the posterior crown scalp, EDC 01/01/24. She is using ketoconazole  2% shampoo and fluocinolone  scalp oil.     The following portions of the chart were reviewed this encounter and updated as appropriate: medications, allergies, medical history  Review of Systems:  No other skin or systemic complaints except as noted in HPI or Assessment and Plan.  Objective  Well appearing patient in no apparent distress; mood and affect are within normal limits.  A focused examination was performed of the following areas: Face, scalp  Relevant physical exam findings are noted in the Assessment and Plan.  mid crown x 4, edge of scar posterior crown scalp x 1 (5) Keratotic papules at mid crown. Scaly papule at edge of scar, posterior crown scalp.  Assessment & Plan  ACTINIC DAMAGE - chronic, secondary to cumulative UV radiation exposure/sun exposure over time - diffuse scaly erythematous macules with underlying dyspigmentation - Recommend daily broad spectrum sunscreen SPF 30+ to sun-exposed areas, reapply every 2 hours as needed.  - Recommend staying in the shade or wearing long sleeves, sun glasses (UVA+UVB protection) and wide brim hats (4-inch brim around the entire circumference of the hat). - Call for new or changing lesions.  HISTORY OF SQUAMOUS CELL CARCINOMA IN SITU OF THE SKIN - posterior crown scalp, EDC 01/01/24 - keratotic macules at crown at part including at edge of EDC site, recheck on f/up - Recommend regular full body skin exams - Recommend daily broad spectrum sunscreen SPF 30+ to sun-exposed areas, reapply every 2 hours as needed.  - Call if any new or changing lesions are noted between office visits  SEBORRHEIC  DERMATITIS w/ Pityriasis Amiantacea  Exam: focal white adherent scale with mild hair matting at crown  Chronic and persistent condition with duration or expected duration over one year. Condition is improving with treatment but not currently at goal.    Seborrheic Dermatitis is a chronic persistent rash characterized by pinkness and scaling most commonly of the mid face but also can occur on the scalp (dandruff), ears; mid chest, mid back and groin.  It tends to be exacerbated by stress and cooler weather.  People who have neurologic disease may experience new onset or exacerbation of existing seborrheic dermatitis.  The condition is not curable but treatable and can be controlled.   Treatment Plan: Cont Ketoconazole  2% shampoo 3x/wk, apply to scalp and let sit 10 minutes before rinsing out   Cont Fluocinolone  scalp oil 2-3 times a week, leave on overnight, wash out in am.    HYPERTROPHIC ACTINIC KERATOSIS (5) mid crown x 4, edge of scar posterior crown scalp x 1 (5) Once scalp healed from cryotherapy treatment, if still has scabs, repeat 5FU and Calcipotriene  solution BID x 7-10 days.  Actinic keratoses are precancerous spots that appear secondary to cumulative UV radiation exposure/sun exposure over time. They are chronic with expected duration over 1 year. A portion of actinic keratoses will progress to squamous cell carcinoma of the skin. It is not possible to reliably predict which spots will progress to skin cancer and so treatment is recommended to prevent development of skin cancer.  Recommend daily broad spectrum sunscreen SPF 30+ to sun-exposed areas, reapply  every 2 hours as needed.  Recommend staying in the shade or wearing long sleeves, sun glasses (UVA+UVB protection) and wide brim hats (4-inch brim around the entire circumference of the hat). Call for new or changing lesions. - Destruction of lesion - mid crown x 4, edge of scar posterior crown scalp x 1 (5)  Destruction method:  cryotherapy   Informed consent: discussed and consent obtained   Lesion destroyed using liquid nitrogen: Yes   Region frozen until ice ball extended beyond lesion: Yes   Outcome: patient tolerated procedure well with no complications   Post-procedure details: wound care instructions given   Additional details:  Prior to procedure, discussed risks of blister formation, small wound, skin dyspigmentation, or rare scar following cryotherapy. Recommend Vaseline ointment to treated areas while healing.   SQUAMOUS CELL CARCINOMA IN SITU   This Visit - Fluorouracil  5 % SOLN - Apply 1 Application topically in the morning and at bedtime. Bid to aa scalp for 7-10 days for actinic keratoses   Return in about 3 months (around 06/09/2024) for AKs, Hx SCC. Recheck nose. Gina Middleton Andrea Ezzard, CMA, am acting as scribe for Rexene Rattler, MD .   Documentation: I have reviewed the above documentation for accuracy and completeness, and I agree with the above.  Rexene Rattler, MD    "

## 2024-03-11 NOTE — Patient Instructions (Addendum)
 Recommend Niacinamide or Nicotinamide 500mg  twice per day to lower risk of non-melanoma skin cancer by approximately 25%. This is usually available at [facility].  After scalp healed from cryotherapy, if there are still scabs, restart 5-fluorouracil /calcipotriene  solution twice a day for 7-10 days to affected areas including scalp.   Reviewed course of treatment and expected reaction.  Patient advised to expect inflammation and crusting and advised that erosions are possible.  Patient advised to be diligent with sun protection during and after treatment. Counseled to keep medication out of reach of children and pets.    Cryotherapy Aftercare  Wash gently with soap and water everyday.   Apply Vaseline and Band-Aid daily until healed.   Due to recent changes in healthcare laws, you may see results of your pathology and/or laboratory studies on MyChart before the doctors have had a chance to review them. We understand that in some cases there may be results that are confusing or concerning to you. Please understand that not all results are received at the same time and often the doctors may need to interpret multiple results in order to provide you with the best plan of care or course of treatment. Therefore, we ask that you please give us  2 business days to thoroughly review all your results before contacting the office for clarification. Should we see a critical lab result, you will be contacted sooner.   If You Need Anything After Your Visit  If you have any questions or concerns for your doctor, please call our main line at [phone] and press option 4 to reach your doctor's medical assistant. If no one answers, please leave a voicemail as directed and we will return your call as soon as possible. Messages left after 4 pm will be answered the following business day.   You may also send us  a message via MyChart. We typically respond to MyChart messages within 1-2 business days.  For  prescription refills, please ask your pharmacy to contact our office. Our fax number is [phone].  If you have an urgent issue when the clinic is closed that cannot wait until the next business day, you can page your doctor at the number below.    Please note that while we do our best to be available for urgent issues outside of office hours, we are not available 24/7.   If you have an urgent issue and are unable to reach us , you may choose to seek medical care at your doctor's office, retail clinic, urgent care center, or emergency room.  If you have a medical emergency, please immediately call 911 or go to the emergency department.  Pager Numbers  - Dr. Hester: [phone]  - Dr. Jackquline: [phone]  - Dr. Claudene: [phone]   - Dr. Raymund: [phone]  In the event of inclement weather, please call our main line at [phone] for an update on the status of any delays or closures.  Dermatology Medication Tips: Please keep the boxes that topical medications come in in order to help keep track of the instructions about where and how to use these. Pharmacies typically print the medication instructions only on the boxes and not directly on the medication tubes.   If your medication is too expensive, please contact our office at [phone] option 4 or send us  a message through MyChart.   We are unable to tell what your co-pay for medications will be in advance as this is different depending on your insurance coverage. However, we may be able  to find a substitute medication at lower cost or fill out paperwork to get insurance to cover a needed medication.   If a prior authorization is required to get your medication covered by your insurance company, please allow us  1-2 business days to complete this process.  Drug prices often vary depending on where the prescription is filled and some pharmacies may offer cheaper prices.  The website www.goodrx.com contains coupons for  medications through different pharmacies. The prices here do not account for what the cost may be with help from insurance (it may be cheaper with your insurance), but the website can give you the price if you did not use any insurance.  - You can print the associated coupon and take it with your prescription to the pharmacy.  - You may also stop by our office during regular business hours and pick up a GoodRx coupon card.  - If you need your prescription sent electronically to a different pharmacy, notify our office through [facility] or by phone at [phone] option 4.     Si Usted Necesita Algo Despus de Su Visita  Tambin puede enviarnos un mensaje a travs de Clinical Cytogeneticist. Por lo general respondemos a los mensajes de MyChart en el transcurso de 1 a 2 das hbiles.  Para renovar recetas, por favor pida a su farmacia que se ponga en contacto con nuestra oficina. Randi lakes de fax es [place] [phone].  Si tiene un asunto urgente cuando la clnica est cerrada y que no puede esperar hasta el siguiente da hbil, puede llamar/localizar a su doctor(a) al nmero que aparece a continuacin.   Por favor, tenga en cuenta que aunque hacemos todo lo posible para estar disponibles para asuntos urgentes fuera del horario de [place], no estamos disponibles las 24 horas del da, los 7 [address]  [address] de la [place].   Si tiene un problema urgente y no puede comunicarse con nosotros, puede optar por buscar atencin mdica  en el consultorio de su doctor(a), en una clnica privada, en un centro de atencin urgente o en una sala de emergencias.  Si tiene engineer, drilling, por favor llame inmediatamente al 911 o vaya a la sala de emergencias.  Nmeros de bper  - Dr. Hester: [phone]  - Dra. Jackquline: [phone]  - Dr. Claudene: [phone]  - Dra. Kitts: [phone]  En caso de inclemencias del [place], por favor llame a nuestra lnea principal al [phone] para una actualizacin sobre el  estado de cualquier retraso o cierre.  Consejos para la medicacin en dermatologa: Por favor, guarde las cajas en las que vienen los medicamentos de uso tpico para ayudarle a seguir las instrucciones sobre dnde y cmo usarlos. Las farmacias generalmente imprimen las instrucciones del medicamento slo en las cajas y no directamente en los tubos del [place].   Si su medicamento es muy caro, por favor, pngase en contacto con landry rieger llamando al [phone] y presione la opcin 4 o envenos un mensaje a travs de Clinical Cytogeneticist.   No podemos decirle cul ser su copago por los medicamentos por adelantado ya que esto es diferente dependiendo de la cobertura de su seguro. Sin embargo, es posible que podamos encontrar un medicamento sustituto a audiological scientist un formulario para que el seguro cubra el medicamento que se considera necesario.   Si se requiere una autorizacin previa para que su compaa de seguros cubra su medicamento, por favor permtanos de 1 a 2 das hbiles para completar este proceso.  Los precios de  los medicamentos varan con frecuencia dependiendo del lugar de dnde se surte la receta y alguna farmacias pueden ofrecer precios ms baratos.  El sitio web www.goodrx.com tiene cupones para medicamentos de health and safety inspector. Los precios aqu no tienen en cuenta lo que podra costar con la ayuda del seguro (puede ser ms barato con su seguro), pero el sitio web puede darle el precio si no utiliz tourist information centre manager.  - Puede imprimir el cupn correspondiente y llevarlo con su receta a la farmacia.  - Tambin puede pasar por nuestra oficina durante el horario de atencin regular y education officer, museum una tarjeta de cupones de GoodRx.  - Si necesita que su receta se enve electrnicamente a una farmacia diferente, informe a nuestra oficina a travs de MyChart de [place] o por telfono llamando al [phone] y presione la opcin 4.

## 2024-06-15 ENCOUNTER — Ambulatory Visit: Admitting: Dermatology

## 2024-07-21 ENCOUNTER — Ambulatory Visit

## 2024-08-24 ENCOUNTER — Encounter
